# Patient Record
Sex: Male | Born: 1965
Health system: Southern US, Community
[De-identification: ages and names within clinical notes are randomized; demographics above are authoritative.]

## PROBLEM LIST (undated history)

## (undated) DIAGNOSIS — M109 Gout, unspecified: Secondary | ICD-10-CM

## (undated) DIAGNOSIS — I447 Left bundle-branch block, unspecified: Secondary | ICD-10-CM

## (undated) DIAGNOSIS — I509 Heart failure, unspecified: Secondary | ICD-10-CM

## (undated) DIAGNOSIS — I493 Ventricular premature depolarization: Secondary | ICD-10-CM

## (undated) DIAGNOSIS — I10 Essential (primary) hypertension: Secondary | ICD-10-CM

## (undated) DIAGNOSIS — I428 Other cardiomyopathies: Secondary | ICD-10-CM

## (undated) DIAGNOSIS — E119 Type 2 diabetes mellitus without complications: Secondary | ICD-10-CM

## (undated) HISTORY — DX: Other cardiomyopathies: I42.8

## (undated) HISTORY — PX: HIP SURGERY: SHX245

## (undated) HISTORY — DX: Type 2 diabetes mellitus without complications: E11.9

## (undated) HISTORY — DX: Essential (primary) hypertension: I10

## (undated) HISTORY — DX: Heart failure, unspecified: I50.9

## (undated) HISTORY — DX: Ventricular premature depolarization: I49.3

## (undated) HISTORY — PX: WRIST SURGERY: SHX841

## (undated) HISTORY — PX: CHOLECYSTECTOMY: SHX55

## (undated) HISTORY — DX: Gout, unspecified: M10.9

---

## 2005-09-24 HISTORY — PX: FRACTURE SURGERY: SHX138

## 2008-09-24 HISTORY — PX: WRIST SURGERY: SHX841

## 2009-09-24 HISTORY — PX: APPENDECTOMY: SHX54

## 2011-07-26 DEATH — deceased

## 2013-12-29 ENCOUNTER — Other Ambulatory Visit: Payer: Self-pay | Admitting: Sports Medicine

## 2013-12-29 ENCOUNTER — Encounter: Payer: Self-pay | Admitting: Sports Medicine

## 2013-12-29 ENCOUNTER — Ambulatory Visit (INDEPENDENT_AMBULATORY_CARE_PROVIDER_SITE_OTHER): Payer: 59 | Admitting: Sports Medicine

## 2013-12-29 VITALS — BP 169/99 | HR 69 | Ht 70.0 in | Wt 313.0 lb

## 2013-12-29 DIAGNOSIS — E038 Other specified hypothyroidism: Secondary | ICD-10-CM

## 2013-12-29 DIAGNOSIS — E039 Hypothyroidism, unspecified: Secondary | ICD-10-CM

## 2013-12-29 DIAGNOSIS — I5022 Chronic systolic (congestive) heart failure: Secondary | ICD-10-CM

## 2013-12-29 DIAGNOSIS — I509 Heart failure, unspecified: Secondary | ICD-10-CM

## 2013-12-29 DIAGNOSIS — I10 Essential (primary) hypertension: Secondary | ICD-10-CM

## 2013-12-29 DIAGNOSIS — M109 Gout, unspecified: Secondary | ICD-10-CM | POA: Insufficient documentation

## 2013-12-29 DIAGNOSIS — E1129 Type 2 diabetes mellitus with other diabetic kidney complication: Secondary | ICD-10-CM | POA: Insufficient documentation

## 2013-12-29 DIAGNOSIS — Z0000 Encounter for general adult medical examination without abnormal findings: Secondary | ICD-10-CM | POA: Insufficient documentation

## 2013-12-29 DIAGNOSIS — Z299 Encounter for prophylactic measures, unspecified: Secondary | ICD-10-CM

## 2013-12-29 DIAGNOSIS — E119 Type 2 diabetes mellitus without complications: Secondary | ICD-10-CM

## 2013-12-29 LAB — CBC
HCT: 39.4 % (ref 39.0–52.0)
Hemoglobin: 13.8 g/dL (ref 13.0–17.0)
MCH: 30.7 pg (ref 26.0–34.0)
MCHC: 35 g/dL (ref 30.0–36.0)
MCV: 87.6 fL (ref 78.0–100.0)
Platelets: 174 10*3/uL (ref 150–400)
RBC: 4.5 MIL/uL (ref 4.22–5.81)
RDW: 14.6 % (ref 11.5–15.5)
WBC: 7.1 10*3/uL (ref 4.0–10.5)

## 2013-12-29 MED ORDER — DAPAGLIFLOZIN PROPANEDIOL 10 MG PO TABS: 10.0000 mg | ORAL_TABLET | Freq: Every day | ORAL | Status: DC

## 2013-12-29 MED ORDER — MELOXICAM 15 MG PO TABS: ORAL_TABLET | ORAL | Status: DC

## 2013-12-29 NOTE — Assessment & Plan Note (Addendum)
Seems to be having mild low grade pain allover. Adding meloxicam, we will likely also add allopurinol if uric acid levels are elevated.  Uric acid levels are 10.7, adding allopurinol.

## 2013-12-29 NOTE — Assessment & Plan Note (Signed)
Blood pressure is elevated, we will recheck this at his followup visit. I would likely add Aldactone.

## 2013-12-29 NOTE — Assessment & Plan Note (Signed)
We can discuss this further at future visits.

## 2013-12-29 NOTE — Assessment & Plan Note (Signed)
Unsure of his control. Switching from glipizide to [place]

## 2013-12-29 NOTE — Progress Notes (Signed)
  Subjective:    CC: Establish care.   HPI:  Hypertension: Took his medicines today, it is a little bit elevated, no headache, chest pain, visual changes.  CHF: Systolic, per patient last ejection fraction was 50%, his cardiologist retired. He is currently stable on Coreg, losartan/hydrochlorothiazide. No paroxysmal nocturnal dyspnea or orthopnea. He does have some early fatigability.  Gout: Has low-grade pain in all joints, who gets flares in his ankles. Used to be on allopurinol but since stopped.  Diabetes mellitus type 2: Has been moderately well controlled on sulfonylureas, he has not tried any of the newer diabetic agents.  Past medical history, Surgical history, Family history not pertinant except as noted below, Social history, Allergies, and medications have been entered into the medical record, reviewed, and no changes needed.   Review of Systems: No headache, visual changes, nausea, vomiting, diarrhea, constipation, dizziness, abdominal pain, skin rash, fevers, chills, night sweats, swollen lymph nodes, weight loss, chest pain, body aches, joint swelling, muscle aches, shortness of breath, mood changes, visual or auditory hallucinations.  Objective:    General: Well Developed, well nourished, and in no acute distress.  Neuro: Alert and oriented x3, extra-ocular muscles intact, sensation grossly intact.  HEENT: Normocephalic, atraumatic, pupils equal round reactive to light, neck supple, no masses, no lymphadenopathy, thyroid nonpalpable.  Skin: Warm and dry, no rashes noted.  Cardiac: Regular rate and rhythm, no rubs or gallops, 1/6 systolic ejection murmur.  Respiratory: Clear to auscultation bilaterally. Not using accessory muscles, speaking in full sentences.  Abdominal: Soft, nontender, nondistended, positive bowel sounds, no masses, no organomegaly.  Musculoskeletal: Shoulder, elbow, wrist, hip, knee, ankle stable, and with full range of motion.  Impression and  Recommendations:    The patient was counselled, risk factors were discussed, anticipatory guidance given.

## 2013-12-29 NOTE — Assessment & Plan Note (Signed)
Seems to be under relatively good control and a good medication regimen. Treatment will center around risk factor modification. Referral downstairs to cardiology, I am going to order an echocardiogram and some routine blood work.

## 2013-12-30 DIAGNOSIS — E038 Other specified hypothyroidism: Secondary | ICD-10-CM | POA: Insufficient documentation

## 2013-12-30 DIAGNOSIS — E039 Hypothyroidism, unspecified: Secondary | ICD-10-CM | POA: Insufficient documentation

## 2013-12-30 LAB — HEMOGLOBIN A1C
Hgb A1c MFr Bld: 5.9 % — ABNORMAL HIGH (ref <5.7)
Mean Plasma Glucose: 123 mg/dL — ABNORMAL HIGH (ref <117)

## 2013-12-30 LAB — COMPREHENSIVE METABOLIC PANEL
ALT: 35 U/L (ref 0–53)
AST: 32 U/L (ref 0–37)
Albumin: 4.2 g/dL (ref 3.5–5.2)
Alkaline Phosphatase: 52 U/L (ref 39–117)
BUN: 17 mg/dL (ref 6–23)
CO2: 29 mEq/L (ref 19–32)
Calcium: 9.6 mg/dL (ref 8.4–10.5)
Chloride: 100 mEq/L (ref 96–112)
Creat: 1.11 mg/dL (ref 0.50–1.35)
Glucose, Bld: 174 mg/dL — ABNORMAL HIGH (ref 70–99)
Potassium: 4.6 mEq/L (ref 3.5–5.3)
Sodium: 140 mEq/L (ref 135–145)
Total Bilirubin: 0.9 mg/dL (ref 0.2–1.2)
Total Protein: 7 g/dL (ref 6.0–8.3)

## 2013-12-30 LAB — TSH: TSH: 5.053 u[IU]/mL — ABNORMAL HIGH (ref 0.350–4.500)

## 2013-12-30 LAB — LIPID PANEL
Cholesterol: 153 mg/dL (ref 0–200)
HDL: 48 mg/dL (ref >39)
LDL Cholesterol: 75 mg/dL (ref 0–99)
Total CHOL/HDL Ratio: 3.2 Ratio
Triglycerides: 152 mg/dL — ABNORMAL HIGH (ref <150)
VLDL: 30 mg/dL (ref 0–40)

## 2013-12-30 LAB — URIC ACID: Uric Acid, Serum: 10.7 mg/dL — ABNORMAL HIGH (ref 4.0–7.8)

## 2013-12-30 LAB — T3, FREE: T3, Free: 2.7 pg/mL (ref 2.3–4.2)

## 2013-12-30 LAB — T4, FREE: Free T4: 0.93 ng/dL (ref 0.80–1.80)

## 2013-12-30 MED ORDER — ALLOPURINOL 300 MG PO TABS: 300.0000 mg | ORAL_TABLET | Freq: Two times a day (BID) | ORAL | Status: DC

## 2013-12-30 NOTE — Assessment & Plan Note (Signed)
TSH is elevated. Adding T3 and T4 levels.

## 2013-12-30 NOTE — Addendum Note (Signed)
Addended by: Monica Becton on: 12/30/2013 08:40 AM   Modules accepted: Orders

## 2013-12-31 ENCOUNTER — Telehealth: Payer: Self-pay | Admitting: *Deleted

## 2013-12-31 DIAGNOSIS — E038 Other specified hypothyroidism: Secondary | ICD-10-CM

## 2013-12-31 DIAGNOSIS — E039 Hypothyroidism, unspecified: Secondary | ICD-10-CM

## 2013-12-31 MED ORDER — LEVOTHYROXINE SODIUM 50 MCG PO TABS: 50.0000 ug | ORAL_TABLET | Freq: Every day | ORAL | Status: DC

## 2013-12-31 NOTE — Addendum Note (Signed)
Addended by: Monica Becton on: 12/31/2013 01:59 PM   Modules accepted: Orders

## 2013-12-31 NOTE — Telephone Encounter (Signed)
Pt left vm stating that he is ok with you sending in a medication for his thyroid.

## 2013-12-31 NOTE — Assessment & Plan Note (Signed)
Normal T3 and T4, elevated TSH. We are going to treat this as untreated subclinical hypothyroidism does have an increased risk of cardiovascular all cause. Starting levothyroxine at 50 mcg.

## 2013-12-31 NOTE — Telephone Encounter (Signed)
Medication sent in. 

## 2014-01-01 NOTE — Telephone Encounter (Signed)
LMOM notifying pt of rx. 

## 2014-01-04 ENCOUNTER — Other Ambulatory Visit: Payer: Self-pay

## 2014-01-04 DIAGNOSIS — M109 Gout, unspecified: Secondary | ICD-10-CM

## 2014-01-04 DIAGNOSIS — E039 Hypothyroidism, unspecified: Secondary | ICD-10-CM

## 2014-01-04 DIAGNOSIS — E038 Other specified hypothyroidism: Secondary | ICD-10-CM

## 2014-01-04 MED ORDER — ALLOPURINOL 300 MG PO TABS: 300.0000 mg | ORAL_TABLET | Freq: Two times a day (BID) | ORAL | Status: DC

## 2014-01-04 MED ORDER — LEVOTHYROXINE SODIUM 50 MCG PO TABS: 50.0000 ug | ORAL_TABLET | Freq: Every day | ORAL | Status: DC

## 2014-01-04 MED ORDER — MELOXICAM 15 MG PO TABS: ORAL_TABLET | ORAL | Status: DC

## 2014-01-04 NOTE — Telephone Encounter (Signed)
Patient called wanted his medication Allopurinol, Meloxicam, and Levothyroxine sent  to [facility]. I called [facility] and cancelled the Rx that was sent in. Reino Lybbert,CMA

## 2014-01-08 ENCOUNTER — Ambulatory Visit (INDEPENDENT_AMBULATORY_CARE_PROVIDER_SITE_OTHER): Payer: 59 | Admitting: Family Medicine

## 2014-01-08 ENCOUNTER — Encounter: Payer: Self-pay | Admitting: Family Medicine

## 2014-01-08 VITALS — BP 154/87 | HR 68 | Wt 315.0 lb

## 2014-01-08 DIAGNOSIS — M109 Gout, unspecified: Secondary | ICD-10-CM

## 2014-01-08 MED ORDER — COLCHICINE 0.6 MG PO TABS: 0.6000 mg | ORAL_TABLET | Freq: Every day | ORAL | Status: DC

## 2014-01-08 NOTE — Progress Notes (Signed)
CC: Andre [name]. is a 48 y.o. male is here for rt hand swollen   Subjective: HPI:  Right hand pain localized in the fifth metacarpal phalangeal joint it is nonradiating mild in severity with mild swelling. Symptoms have been fluctuating on a daily basis since Saturday. Symptoms were resolving without intervention he then started allopurinol to stay and symptoms have been more persistent. Symptoms are worse when using his hand in the shop nothing particularly makes it better. He describes it as pain that he has had with gout however no superficial pain that typically accompanies gout.  Denies fevers, chills, nor overlying skin changes other than swelling.  Review Of Systems Outlined In HPI  Past Medical History  Diagnosis Date  . Hypertension   . Diabetes mellitus without complication   . Gout   . CHF (congestive heart failure)     Past Surgical History  Procedure Laterality Date  . Hip surgery    . Wrist surgery     Family History  Problem Relation Age of Onset  . Heart attack Father   . Hypertension Father   . Depression Sister   . Alcohol abuse Maternal Grandmother   . Alcohol abuse Maternal Grandfather     History   Social History  . Marital Status: Married    Spouse Name: N/A    Number of Children: N/A  . Years of Education: N/A   Occupational History  . Not on file.   Social History Main Topics  . Smoking status: Never Smoker   . Smokeless tobacco: Not on file  . Alcohol Use: Yes  . Drug Use: Not on file  . Sexual Activity: Not on file   Other Topics Concern  . Not on file   Social History Narrative  . No narrative on file     Objective: BP 154/87  Pulse 68  Wt 315 lb (142.883 kg)  General: Alert and Oriented, No Acute Distress HEENT: Pupils equal, round, reactive to light. Conjunctivae clear.  Moist mucous membranes Cardiac: Regular rate and rhythm. Normal S1/S2.  No murmurs, rubs, nor gallops.   Extremities: Mild swelling over the fifth  metacarpal phalangeal joint in the right hand dorsal and volar aspect. At the site there is no redness nor warmth nor pain to light touch. He has full range of motion strength throughout the fingers in the right hand neurovascularly intact throughout the right hand  Strong peripheral pulses.  Mental Status: No depression, anxiety, nor agitation. Skin: Warm and dry.  Assessment & Plan: [name] was seen today for rt hand swollen.  Diagnoses and associated orders for this visit:  Gout attack - colchicine 0.6 MG tablet; Take 1 tablet (0.6 mg total) by mouth daily. Only for one month.    It appears that he's probably having gout symptoms due to starting allopurinol without being on colchicine therefore heading daily colchicine for the next month. If not improving by Monday call for prednisone taper.   Return if symptoms worsen or fail to improve.

## 2014-01-15 ENCOUNTER — Encounter: Payer: Self-pay | Admitting: Sports Medicine

## 2014-01-18 MED ORDER — CARVEDILOL 25 MG PO TABS: 25.0000 mg | ORAL_TABLET | Freq: Two times a day (BID) | ORAL | Status: DC

## 2014-01-26 ENCOUNTER — Encounter: Payer: Self-pay | Admitting: Sports Medicine

## 2014-01-26 ENCOUNTER — Ambulatory Visit (INDEPENDENT_AMBULATORY_CARE_PROVIDER_SITE_OTHER): Payer: 59 | Admitting: Sports Medicine

## 2014-01-26 VITALS — BP 168/92 | HR 72 | Ht 70.0 in | Wt 312.0 lb

## 2014-01-26 DIAGNOSIS — E119 Type 2 diabetes mellitus without complications: Secondary | ICD-10-CM

## 2014-01-26 DIAGNOSIS — M109 Gout, unspecified: Secondary | ICD-10-CM

## 2014-01-26 DIAGNOSIS — I10 Essential (primary) hypertension: Secondary | ICD-10-CM

## 2014-01-26 DIAGNOSIS — I5022 Chronic systolic (congestive) heart failure: Secondary | ICD-10-CM

## 2014-01-26 MED ORDER — AMLODIPINE BESYLATE 10 MG PO TABS: 10.0000 mg | ORAL_TABLET | Freq: Every day | ORAL | Status: DC

## 2014-01-26 MED ORDER — SPIRONOLACTONE 25 MG PO TABS: 25.0000 mg | ORAL_TABLET | Freq: Two times a day (BID) | ORAL | Status: DC

## 2014-01-26 NOTE — Assessment & Plan Note (Signed)
Continue Farxiga, voiding much more.

## 2014-01-26 NOTE — Progress Notes (Signed)
  Subjective:    CC: Followup  HPI: CHF: Slight orthopnea, otherwise asymptomatic.  Hypertension: On carvedilol and valsartan/hydrochlorothiazide at maximal doses, blood pressure continues to be elevated.  Diabetes mellitus type 2: Stable on Farxiga, he is getting increasing urination.  Gout: Did have a flare recently and is currently still on colchicine, improved significantly, uric acid levels were over 10, currently on allopurinol as well.  Past medical history, Surgical history, Family history not pertinant except as noted below, Social history, Allergies, and medications have been entered into the medical record, reviewed, and no changes needed.   Review of Systems: No fevers, chills, night sweats, weight loss, chest pain, or shortness of breath.   Objective:    General: Well Developed, well nourished, and in no acute distress.  Neuro: Alert and oriented x3, extra-ocular muscles intact, sensation grossly intact.  HEENT: Normocephalic, atraumatic, pupils equal round reactive to light, neck supple, no masses, no lymphadenopathy, thyroid nonpalpable.  Skin: Warm and dry, no rashes. Cardiac: Regular rate and rhythm, no murmurs rubs or gallops, no lower extremity edema.  Respiratory: Clear to auscultation bilaterally. Not using accessory muscles, speaking in full sentences.  Impression and Recommendations:

## 2014-01-26 NOTE — Assessment & Plan Note (Signed)
Resolved with current medication, will be finishing up colchicine fairly soon.

## 2014-01-26 NOTE — Assessment & Plan Note (Signed)
He is having some orthopnea. Voiding more with farxiga but weight has stayed essentially the same, this is slightly concerning. Starting Aldactone.

## 2014-01-26 NOTE — Assessment & Plan Note (Signed)
Persistently elevated despite starting [place] and current medications. He is voiding significantly more. I am going to add amlodipine and aldactone. Return in 2 weeks.

## 2014-01-28 ENCOUNTER — Encounter: Payer: Self-pay | Admitting: Internal Medicine

## 2014-01-28 ENCOUNTER — Ambulatory Visit (INDEPENDENT_AMBULATORY_CARE_PROVIDER_SITE_OTHER): Payer: 59 | Admitting: Internal Medicine

## 2014-01-28 VITALS — BP 160/108 | HR 72 | Ht 70.0 in | Wt 311.0 lb

## 2014-01-28 DIAGNOSIS — I10 Essential (primary) hypertension: Secondary | ICD-10-CM

## 2014-01-28 DIAGNOSIS — I5022 Chronic systolic (congestive) heart failure: Secondary | ICD-10-CM

## 2014-01-28 LAB — BASIC METABOLIC PANEL
BUN: 13 mg/dL (ref 6–23)
CO2: 28 mEq/L (ref 19–32)
Calcium: 9 mg/dL (ref 8.4–10.5)
Chloride: 104 mEq/L (ref 96–112)
Creatinine, Ser: 1.4 mg/dL (ref 0.4–1.5)
GFR: 59.6 mL/min — ABNORMAL LOW (ref >60.00)
Glucose, Bld: 136 mg/dL — ABNORMAL HIGH (ref 70–99)
Potassium: 4 mEq/L (ref 3.5–5.1)
Sodium: 139 mEq/L (ref 135–145)

## 2014-01-28 LAB — BRAIN NATRIURETIC PEPTIDE: Pro B Natriuretic peptide (BNP): 323 pg/mL — ABNORMAL HIGH (ref 0.0–100.0)

## 2014-01-28 NOTE — Patient Instructions (Addendum)
Your physician recommends that you continue on your current medications as directed. Please refer to the Current Medication list given to you today.  Your physician has requested that you have an echocardiogram. Echocardiography is a painless test that uses sound waves to create images of your heart. It provides your doctor with information about the size and shape of your heart and how well your heart's chambers and valves are working. This procedure takes approximately one hour. There are no restrictions for this procedure.  Your physician recommends that you return for lab work TODAY (BMET, BNP)  Your physician recommends that you schedule a follow-up appointment in: 8 WEEKS WITH DR. Tenny Craw.

## 2014-01-28 NOTE — Progress Notes (Signed)
HPI  Andre [name] is a 48 yo who was referred for continued cardiac care He has a history lf HTN, gout, mild LV dysfunction on eco. He was seen by Dr Yetta BarreJones yesterday  AMlodipine and aldactone were added to his regimen.   Followed by Dr Ardyth HarpsKrull in [place]  Did minimum due to lack of insurance. Patient was admitted in 2004 with CHF  Diuresed  Had cath later ([facility]) Reported normal Last echo was several years aog  DOne infreq due to lack of insurance.   No CP  Recently orthopnea, wheezing,  SOB  Recently.    NO dizzess.    Tested for sleep apnea in 2006  Reported negative.    No Known Allergies  Current Outpatient Prescriptions  Medication Sig Dispense Refill  . allopurinol (ZYLOPRIM) 300 MG tablet Take 1 tablet (300 mg total) by mouth 2 (two) times daily.  60 tablet  3  . amLODipine (NORVASC) 10 MG tablet Take 1 tablet (10 mg total) by mouth daily.  30 tablet  11  . aspirin 81 MG tablet Take 81 mg by mouth daily.      . carvedilol (COREG) 25 MG tablet Take 1 tablet (25 mg total) by mouth 2 (two) times daily with a meal.  180 tablet  3  . colchicine 0.6 MG tablet Take 1 tablet (0.6 mg total) by mouth daily. Only for one month.  30 tablet  0  . Dapagliflozin Propanediol (FARXIGA) 10 MG TABS Take 10 mg by mouth daily.  30 tablet  3  . levothyroxine (SYNTHROID, LEVOTHROID) 50 MCG tablet Take 1 tablet (50 mcg total) by mouth daily.  90 tablet  3  . losartan-hydrochlorothiazide (HYZAAR) 100-25 MG per tablet Take 1 tablet by mouth daily.      . meloxicam (MOBIC) 15 MG tablet One tab PO qAM with breakfast for 2 weeks, then daily prn pain.  30 tablet  3  . Multiple Vitamins-Minerals (EQ COMPLETE MULTIVITAMIN-ADULT PO) Take by mouth.      . spironolactone (ALDACTONE) 25 MG tablet Take 1 tablet (25 mg total) by mouth 2 (two) times daily.  180 tablet  3   No current facility-administered medications for this visit.    Past Medical History  Diagnosis Date  . Hypertension   . Diabetes mellitus without  complication   . Gout   . CHF (congestive heart failure)     Past Surgical History  Procedure Laterality Date  . Hip surgery    . Wrist surgery      Family History  Problem Relation Age of Onset  . Heart attack Father   . Hypertension Father   . Depression Sister   . Alcohol abuse Maternal Grandmother   . Alcohol abuse Maternal Grandfather     History   Social History  . Marital Status: Married    Spouse Name: N/A    Number of Children: N/A  . Years of Education: N/A   Occupational History  . Not on file.   Social History Main Topics  . Smoking status: Never Smoker   . Smokeless tobacco: Not on file  . Alcohol Use: Yes  . Drug Use: Not on file  . Sexual Activity: Not on file   Other Topics Concern  . Not on file   Social History Narrative  . No narrative on file    Review of Systems:  All systems reviewed.  They are negative to the above problem except as previously stated.  Vital Signs: BP 160/108  Pulse 72  Ht 5\' 10"  (1.778 m)  Wt 311 lb (141.069 kg)  BMI 44.62 kg/m2  Physical Exam Patient is in NAD HEENT:  Normocephalic, atraumatic. EOMI, PERRLA.  Neck: JVP is normal.  No bruits.  Lungs: clear to auscultation. No rales no wheezes.  Heart: Regular rate and rhythm. Normal S1, S2. No S3.   No significant murmurs. PMI not displaced.  Abdomen:  Supple, nontender. Normal bowel sounds. No masses. No hepatomegaly.  Extremities:   Good distal pulses throughout. No lower extremity edema.  Musculoskeletal :moving all extremities.  Neuro:   alert and oriented x3.  CN II-XII grossly intact.  EK  SR 72  LVH with strain pattern   Assessment and Plan:  1.  CHF  No records  Sounds nonischemic  Will get records from [place] [facility]   Recent wheezing, orthopnea  Volume looks ok on exam WIll get BMET and BNP  Set up for echo  Tentative set up in 2 mons  2.  HTN  HIgh  He says it has always been high but this is higher  Just took meds (new) today Will need to have K  followed  Has appt with Dr T in 2 wks  3.  HL  Good control    4.  Morbid obesity  Counselled on wt loss

## 2014-02-01 ENCOUNTER — Telehealth: Payer: Self-pay | Admitting: Internal Medicine

## 2014-02-01 MED ORDER — FUROSEMIDE 40 MG PO TABS: ORAL_TABLET | ORAL | Status: DC

## 2014-02-01 NOTE — Telephone Encounter (Signed)
Informed patient of lab results and dr Tenny Craw recommendation to begin lasix 40 mg twice a week and recheck labs in 10 days. Appointment made for 5/21 (coming that day for echo as well). Patient verbalizes understanding and agreement.

## 2014-02-01 NOTE — Telephone Encounter (Signed)
ROI faxed to [facility] [phone] °

## 2014-02-01 NOTE — Telephone Encounter (Signed)
New message  ° ° °Patient calling for test results.   °

## 2014-02-09 ENCOUNTER — Encounter: Payer: Self-pay | Admitting: Sports Medicine

## 2014-02-09 ENCOUNTER — Ambulatory Visit (INDEPENDENT_AMBULATORY_CARE_PROVIDER_SITE_OTHER): Payer: 59 | Admitting: Sports Medicine

## 2014-02-09 VITALS — BP 117/75 | HR 78 | Ht 70.0 in | Wt 304.0 lb

## 2014-02-09 DIAGNOSIS — E038 Other specified hypothyroidism: Secondary | ICD-10-CM

## 2014-02-09 DIAGNOSIS — E119 Type 2 diabetes mellitus without complications: Secondary | ICD-10-CM

## 2014-02-09 DIAGNOSIS — I10 Essential (primary) hypertension: Secondary | ICD-10-CM

## 2014-02-09 DIAGNOSIS — E039 Hypothyroidism, unspecified: Secondary | ICD-10-CM

## 2014-02-09 DIAGNOSIS — I5022 Chronic systolic (congestive) heart failure: Secondary | ICD-10-CM

## 2014-02-09 LAB — TSH: TSH: 3.45 u[IU]/mL (ref 0.350–4.500)

## 2014-02-09 LAB — BASIC METABOLIC PANEL
BUN: 23 mg/dL (ref 6–23)
CO2: 28 mEq/L (ref 19–32)
Calcium: 10 mg/dL (ref 8.4–10.5)
Chloride: 96 mEq/L (ref 96–112)
Creat: 1.4 mg/dL — ABNORMAL HIGH (ref 0.50–1.35)
Glucose, Bld: 182 mg/dL — ABNORMAL HIGH (ref 70–99)
Potassium: 5.2 mEq/L (ref 3.5–5.3)
Sodium: 134 mEq/L — ABNORMAL LOW (ref 135–145)

## 2014-02-09 MED ORDER — CANAGLIFLOZIN 100 MG PO TABS: 1.0000 | ORAL_TABLET | Freq: Every day | ORAL | Status: DC

## 2014-02-09 NOTE — Assessment & Plan Note (Signed)
Rechecking TSH 

## 2014-02-09 NOTE — Assessment & Plan Note (Signed)
Euvolemic today. Blood pressure has stabilized, this is likely the Lasix effect. He does have followup with cardiology. He is currently symptomatically for resolution of orthopnea.

## 2014-02-09 NOTE — Progress Notes (Signed)
  Subjective:    CC: Followup  HPI: Systolic heart failure: Currently seeing cardiology, blood pressures have been elevated in the past despite max doses of thiazide/ACE, carvedilol, and Aldactone. Recently he was started on Lasix 40 mg twice a week, all orthopnea has resolved, blood pressure has improved.  Hypertension: Stabilized on additional dose of Lasix.  Diabetes mellitus type 2: Unable to get [place]. It sounds as though Invokana, is going to be the preferred agent.  Hyperlipidemia: Stable  Hypothyroidism, subclinical: Due for TSH recheck.  Past medical history, Surgical history, Family history not pertinant except as noted below, Social history, Allergies, and medications have been entered into the medical record, reviewed, and no changes needed.   Review of Systems: No fevers, chills, night sweats, weight loss, chest pain, or shortness of breath.   Objective:    General: Well Developed, well nourished, and in no acute distress.  Neuro: Alert and oriented x3, extra-ocular muscles intact, sensation grossly intact.  HEENT: Normocephalic, atraumatic, pupils equal round reactive to light, neck supple, no masses, no lymphadenopathy, thyroid nonpalpable.  Skin: Warm and dry, no rashes. Cardiac: Regular rate and rhythm, no murmurs rubs or gallops, no lower extremity edema.  Respiratory: Clear to auscultation bilaterally. Not using accessory muscles, speaking in full sentences.  Impression and Recommendations:

## 2014-02-09 NOTE — Assessment & Plan Note (Signed)
Marcelline Deist not covered, switching to invokana.

## 2014-02-09 NOTE — Assessment & Plan Note (Addendum)
Well controlled now, no changes. Rechecking BMET now that he is on Lasix and spironolactone.

## 2014-02-11 ENCOUNTER — Ambulatory Visit (HOSPITAL_COMMUNITY): Payer: 59 | Attending: Cardiovascular Disease | Admitting: Radiology

## 2014-02-11 ENCOUNTER — Ambulatory Visit (INDEPENDENT_AMBULATORY_CARE_PROVIDER_SITE_OTHER): Payer: 59 | Admitting: *Deleted

## 2014-02-11 DIAGNOSIS — I10 Essential (primary) hypertension: Secondary | ICD-10-CM

## 2014-02-11 DIAGNOSIS — I5022 Chronic systolic (congestive) heart failure: Secondary | ICD-10-CM

## 2014-02-11 DIAGNOSIS — I509 Heart failure, unspecified: Secondary | ICD-10-CM

## 2014-02-11 DIAGNOSIS — E119 Type 2 diabetes mellitus without complications: Secondary | ICD-10-CM

## 2014-02-11 LAB — BASIC METABOLIC PANEL
BUN: 24 mg/dL — ABNORMAL HIGH (ref 6–23)
CO2: 27 mEq/L (ref 19–32)
Calcium: 9.7 mg/dL (ref 8.4–10.5)
Chloride: 97 mEq/L (ref 96–112)
Creatinine, Ser: 1.4 mg/dL (ref 0.4–1.5)
GFR: 59.59 mL/min — ABNORMAL LOW (ref >60.00)
Glucose, Bld: 218 mg/dL — ABNORMAL HIGH (ref 70–99)
Potassium: 3.9 mEq/L (ref 3.5–5.1)
Sodium: 133 mEq/L — ABNORMAL LOW (ref 135–145)

## 2014-02-11 LAB — BRAIN NATRIURETIC PEPTIDE: Pro B Natriuretic peptide (BNP): 61 pg/mL (ref 0.0–100.0)

## 2014-02-11 NOTE — Progress Notes (Signed)
Echocardiogram performed.  

## 2014-02-17 ENCOUNTER — Encounter: Payer: Self-pay | Admitting: Internal Medicine

## 2014-02-17 NOTE — Telephone Encounter (Signed)
Left message for pt to call.

## 2014-02-17 NOTE — Telephone Encounter (Signed)
Follow up      Patient calling back stating that someone call him.

## 2014-02-18 NOTE — Telephone Encounter (Signed)
This encounter was created in error - please disregard.

## 2014-03-08 ENCOUNTER — Ambulatory Visit: Payer: 59 | Admitting: Sports Medicine

## 2014-04-05 ENCOUNTER — Ambulatory Visit (INDEPENDENT_AMBULATORY_CARE_PROVIDER_SITE_OTHER): Payer: Self-pay | Admitting: Internal Medicine

## 2014-04-05 ENCOUNTER — Encounter: Payer: Self-pay | Admitting: Internal Medicine

## 2014-04-05 VITALS — BP 125/71 | HR 75 | Ht 70.0 in | Wt 304.0 lb

## 2014-04-05 DIAGNOSIS — R0601 Orthopnea: Secondary | ICD-10-CM

## 2014-04-05 LAB — BRAIN NATRIURETIC PEPTIDE: Pro B Natriuretic peptide (BNP): 70 pg/mL (ref 0.0–100.0)

## 2014-04-05 LAB — BASIC METABOLIC PANEL
BUN: 21 mg/dL (ref 6–23)
CO2: 28 mEq/L (ref 19–32)
Calcium: 9.2 mg/dL (ref 8.4–10.5)
Chloride: 106 mEq/L (ref 96–112)
Creatinine, Ser: 1.2 mg/dL (ref 0.4–1.5)
GFR: 68.72 mL/min (ref >60.00)
Glucose, Bld: 202 mg/dL — ABNORMAL HIGH (ref 70–99)
Potassium: 4.3 mEq/L (ref 3.5–5.1)
Sodium: 138 mEq/L (ref 135–145)

## 2014-04-05 NOTE — Progress Notes (Signed)
HPI  Andre [name] is a 48 yo who was referred for continued cardiac care He has a history lf HTN, gout, mild LV dysfunction on echo. He has had his medcines for HTN adjusted this spring  Some of problems are becaue he lacks insurance. He was seen by Dr Yetta Barre yesterday  AMlodipine and aldactone were added to his regimen.   Cath in 2004 nromal   Followed by Dr Ardyth Harps in [facility]  I saw the patient in May  Recomm that he take lasix bid 1x per week   Echo showed severel LV dysfunction LVEF 20 to 25%.   Tested for sleep apnea in 2006  Reported negative.    No Known Allergies  Current Outpatient Prescriptions  Medication Sig Dispense Refill  . allopurinol (ZYLOPRIM) 300 MG tablet Take 1 tablet (300 mg total) by mouth 2 (two) times daily.  60 tablet  3  . amLODipine (NORVASC) 10 MG tablet Take 1 tablet (10 mg total) by mouth daily.  30 tablet  11  . aspirin 81 MG tablet Take 81 mg by mouth daily.      . Canagliflozin (INVOKANA) 100 MG TABS Take 1 tablet (100 mg total) by mouth daily.  30 tablet  3  . carvedilol (COREG) 25 MG tablet Take 1 tablet (25 mg total) by mouth 2 (two) times daily with a meal.  180 tablet  3  . colchicine 0.6 MG tablet Take 1 tablet (0.6 mg total) by mouth daily. Only for one month.  30 tablet  0  . furosemide (LASIX) 40 MG tablet Take 40 mg twice a week  30 tablet  6  . levothyroxine (SYNTHROID, LEVOTHROID) 50 MCG tablet Take 1 tablet (50 mcg total) by mouth daily.  90 tablet  3  . losartan-hydrochlorothiazide (HYZAAR) 100-25 MG per tablet Take 1 tablet by mouth daily.      . meloxicam (MOBIC) 15 MG tablet One tab PO qAM with breakfast for 2 weeks, then daily prn pain.  30 tablet  3  . Multiple Vitamins-Minerals (EQ COMPLETE MULTIVITAMIN-ADULT PO) Take by mouth.      . spironolactone (ALDACTONE) 25 MG tablet Take 1 tablet (25 mg total) by mouth 2 (two) times daily.  180 tablet  3   No current facility-administered medications for this visit.    Past Medical History  Diagnosis  Date  . Hypertension   . Diabetes mellitus without complication   . Gout   . CHF (congestive heart failure)     Past Surgical History  Procedure Laterality Date  . Hip surgery    . Wrist surgery      Family History  Problem Relation Age of Onset  . Heart attack Father   . Hypertension Father   . Depression Sister   . Alcohol abuse Maternal Grandmother   . Alcohol abuse Maternal Grandfather     History   Social History  . Marital Status: Married    Spouse Name: N/A    Number of Children: N/A  . Years of Education: N/A   Occupational History  . Not on file.   Social History Main Topics  . Smoking status: Never Smoker   . Smokeless tobacco: Not on file  . Alcohol Use: Yes  . Drug Use: Not on file  . Sexual Activity: Not on file   Other Topics Concern  . Not on file   Social History Narrative  . No narrative on file    Review of Systems:  All systems reviewed.  They are negative to the above problem except as previously stated.  Vital Signs: BP 125/71  Pulse 75  Ht 5\' 10"  (1.778 m)  Wt 304 lb (137.893 kg)  BMI 43.62 kg/m2  Physical Exam Patient is in NAD HEENT:  Normocephalic, atraumatic. EOMI, PERRLA.  Neck: JVP is normal.  No bruits.  Lungs: clear to auscultation. No rales no wheezes.  Heart: Regular rate and rhythm. Normal S1, S2. No S3.   No significant murmurs. PMI not displaced.  Abdomen:  Supple, nontender. Normal bowel sounds. No masses. No hepatomegaly.  Extremities:   Good distal pulses throughout. No lower extremity edema.  Musculoskeletal :moving all extremities.  Neuro:   alert and oriented x3.  CN II-XII grossly intact.  EK  SR 72  LVH with strain pattern   Assessment and Plan:  1.  CHF  Still no records from [place]  Will need to get. Patinet still with SOB  I would set up for lexiscna myoview to r/o ischemia.   2.  HTN  BP is better.   3.  HL  Good control    4.  Morbid obesity  Counselled on wt loss

## 2014-04-05 NOTE — Patient Instructions (Signed)
Will obtain labs today and call you with the results (BMET/BNP)  Your physician recommends that you continue on your current medications as directed. Please refer to the Current Medication list given to you today.  Your physician has requested that you have a lexiscan myoview. For further information please visit [email or web address] Please follow instruction sheet, as given.

## 2014-04-13 ENCOUNTER — Ambulatory Visit (HOSPITAL_COMMUNITY): Payer: Self-pay | Attending: Cardiovascular Disease | Admitting: Radiology

## 2014-04-13 VITALS — BP 147/105 | HR 80 | Ht 70.0 in | Wt 300.0 lb

## 2014-04-13 DIAGNOSIS — R0602 Shortness of breath: Secondary | ICD-10-CM | POA: Insufficient documentation

## 2014-04-13 DIAGNOSIS — I10 Essential (primary) hypertension: Secondary | ICD-10-CM | POA: Insufficient documentation

## 2014-04-13 DIAGNOSIS — R0601 Orthopnea: Secondary | ICD-10-CM

## 2014-04-13 DIAGNOSIS — Z8249 Family history of ischemic heart disease and other diseases of the circulatory system: Secondary | ICD-10-CM | POA: Insufficient documentation

## 2014-04-13 DIAGNOSIS — E119 Type 2 diabetes mellitus without complications: Secondary | ICD-10-CM | POA: Insufficient documentation

## 2014-04-13 MED ORDER — REGADENOSON 0.4 MG/5ML IV SOLN
0.4000 mg | Freq: Once | INTRAVENOUS | Status: AC
  Administered 2014-04-13: 0.4 mg via INTRAVENOUS

## 2014-04-13 MED ORDER — TECHNETIUM TC 99M SESTAMIBI GENERIC - CARDIOLITE
33.0000 | Freq: Once | INTRAVENOUS | Status: AC | PRN
  Administered 2014-04-13: 33 via INTRAVENOUS

## 2014-04-13 NOTE — Progress Notes (Signed)
[facility] SITE 3 NUCLEAR MED [address] [place], [place] [address] [phone]    Cardiology Nuclear Med Study  Andre [name]. is a 48 y.o. male     MRN : [id number]     DOB: [birth year]  Procedure Date: 04/13/2014  Nuclear Med Background Indication for Stress Test:  Evaluation for Ischemia  History:  Cath 2004 (normal), Echo 2015 EF 20-25% Cardiac Risk Factors: Family History - CAD, Hypertension and NIDDM  Symptoms:  SOB   Nuclear Pre-Procedure Caffeine/Decaff Intake:  None NPO After: 6:00am   Lungs:  clear O2 Sat: 96% on room air. IV 0.9% NS with Angio Cath:  22g  IV Site: R Hand  IV Started by:  Bonnita Levan, RN  Chest Size (in):  50+ Cup Size: n/a  Height: 5\' 10"  (1.778 m)  Weight:  300 lb (136.079 kg)  BMI:  Body mass index is 43.05 kg/(m^2). Tech Comments:  n/a    Nuclear Med Study 1 or 2 day study: 2 day  Stress Test Type:  Lexiscan  Reading MD: Armanda Magic, MD  Order Authorizing Provider:  Dietrich Pates, MD  Resting Radionuclide: Technetium 34m Sestamibi  Resting Radionuclide Dose: 33.0 mCi on 04/14/14   Stress Radionuclide:  Technetium 51m Sestamibi  Stress Radionuclide Dose: 33.0 mCi on 04/13/14           Stress Protocol Rest HR: 80 Stress HR: 95  Rest BP: 147/105 Stress BP: 108/84  Exercise Time (min): n/a METS: n/a   Predicted Max HR: 175 bpm % Max HR: 54.29 bpm Rate Pressure Product: 00174   Dose of Adenosine (mg):  n/a Dose of Lexiscan: 0.4 mg  Dose of Atropine (mg): n/a Dose of Dobutamine: n/a mcg/kg/min (at max HR)  Stress Test Technologist: Nelson Chimes, BS-ES  Nuclear Technologist:  Doyne Keel, CNMT     Rest Procedure:  Myocardial perfusion imaging was performed at rest 45 minutes following the intravenous administration of Technetium 19m Sestamibi. Rest ECG: NSR with anterior infarct and PVC's, LAFB with T wave inversions in I and aVL.  Stress Procedure:  The patient received IV Lexiscan 0.4 mg over 15-seconds.  Technetium  80m Sestamibi injected at 30-seconds.  Quantitative spect images were obtained after a 45 minute delay. During the infusion of Lexiscan the patient complained of SOB.  This resolved in recovery.  Stress ECG: No significant change from baseline ECG  QPS Raw Data Images:  Mild diaphragmatic attenuation.  Normal left ventricular size. Stress Images:  There is decreased uptake in the apex.  There is decreased uptake in the entire inferior wall. Rest Images:  There is decreased uptake in the apex.  There is decreased uptake in the entire inferior wall. Subtraction (SDS):  The apical defect appears fixed.  The inferior defect is partially fixed c/w infarct with very mild peri infarct ischemia. Transient Ischemic Dilatation (Normal <1.22):  1.03 Lung/Heart Ratio (Normal <0.45):  0.32  Quantitative Gated Spect Images QGS EDV:  n/a QGS ESV:  n/a  Impression Exercise Capacity:  Lexiscan with no exercise. BP Response:  Normal blood pressure response. Clinical Symptoms:  There is dyspnea. ECG Impression:  No significant ST segment change suggestive of ischemia. Comparison with Prior Nuclear Study: No images to compare  Overall Impression:  Intermediate risk stress nuclear study The apical defect appears fixed.  The inferior defect is partially fixed c/w infarct with very mild peri infarct ischemia.  This could also represents variations in diaphragmatic attenution since there is significant  overlying diaphragm noted on RAW images..  LV Ejection Fraction: Study not gated.  LV Wall Motion:  Study not gated.  2D echo showed severe LV dysfunction with EF 20-25% and global hypokinesis.  Signed: Armanda Magicraci Cheryal Salas, MD [facility]

## 2014-04-14 ENCOUNTER — Ambulatory Visit (HOSPITAL_COMMUNITY): Payer: Self-pay | Attending: Cardiovascular Disease

## 2014-04-14 DIAGNOSIS — R0989 Other specified symptoms and signs involving the circulatory and respiratory systems: Secondary | ICD-10-CM

## 2014-04-14 MED ORDER — TECHNETIUM TC 99M SESTAMIBI GENERIC - CARDIOLITE
33.0000 | Freq: Once | INTRAVENOUS | Status: AC | PRN
  Administered 2014-04-14: 33 via INTRAVENOUS

## 2014-05-12 ENCOUNTER — Ambulatory Visit: Payer: 59 | Admitting: Sports Medicine

## 2014-09-29 ENCOUNTER — Ambulatory Visit (INDEPENDENT_AMBULATORY_CARE_PROVIDER_SITE_OTHER): Payer: BLUE CROSS/BLUE SHIELD | Admitting: Sports Medicine

## 2014-09-29 ENCOUNTER — Encounter: Payer: Self-pay | Admitting: Sports Medicine

## 2014-09-29 VITALS — BP 125/83 | HR 93 | Ht 70.0 in | Wt 302.0 lb

## 2014-09-29 DIAGNOSIS — I5022 Chronic systolic (congestive) heart failure: Secondary | ICD-10-CM

## 2014-09-29 DIAGNOSIS — M1000 Idiopathic gout, unspecified site: Secondary | ICD-10-CM

## 2014-09-29 DIAGNOSIS — M109 Gout, unspecified: Secondary | ICD-10-CM

## 2014-09-29 DIAGNOSIS — Z23 Encounter for immunization: Secondary | ICD-10-CM

## 2014-09-29 DIAGNOSIS — I10 Essential (primary) hypertension: Secondary | ICD-10-CM

## 2014-09-29 DIAGNOSIS — E038 Other specified hypothyroidism: Secondary | ICD-10-CM

## 2014-09-29 DIAGNOSIS — E039 Hypothyroidism, unspecified: Secondary | ICD-10-CM

## 2014-09-29 DIAGNOSIS — E119 Type 2 diabetes mellitus without complications: Secondary | ICD-10-CM

## 2014-09-29 LAB — CBC
HCT: 43.3 % (ref 39.0–52.0)
Hemoglobin: 15.6 g/dL (ref 13.0–17.0)
MCH: 30.7 pg (ref 26.0–34.0)
MCHC: 36 g/dL (ref 30.0–36.0)
MCV: 85.2 fL (ref 78.0–100.0)
MPV: 11.1 fL (ref 8.6–12.4)
Platelets: 206 10*3/uL (ref 150–400)
RBC: 5.08 MIL/uL (ref 4.22–5.81)
RDW: 14.2 % (ref 11.5–15.5)
WBC: 8.3 10*3/uL (ref 4.0–10.5)

## 2014-09-29 LAB — COMPREHENSIVE METABOLIC PANEL
ALT: 45 U/L (ref 0–53)
AST: 32 U/L (ref 0–37)
Albumin: 4.2 g/dL (ref 3.5–5.2)
Alkaline Phosphatase: 56 U/L (ref 39–117)
BUN: 17 mg/dL (ref 6–23)
CO2: 28 mEq/L (ref 19–32)
Calcium: 9.5 mg/dL (ref 8.4–10.5)
Chloride: 99 mEq/L (ref 96–112)
Creat: 1.07 mg/dL (ref 0.50–1.35)
Glucose, Bld: 232 mg/dL — ABNORMAL HIGH (ref 70–99)
Potassium: 4.3 mEq/L (ref 3.5–5.3)
Sodium: 136 mEq/L (ref 135–145)
Total Bilirubin: 1.1 mg/dL (ref 0.2–1.2)
Total Protein: 7.3 g/dL (ref 6.0–8.3)

## 2014-09-29 LAB — LIPID PANEL
Cholesterol: 160 mg/dL (ref 0–200)
HDL: 42 mg/dL (ref >39)
LDL Cholesterol: 92 mg/dL (ref 0–99)
Total CHOL/HDL Ratio: 3.8 Ratio
Triglycerides: 129 mg/dL (ref <150)
VLDL: 26 mg/dL (ref 0–40)

## 2014-09-29 LAB — HEMOGLOBIN A1C
Hgb A1c MFr Bld: 7.1 % — ABNORMAL HIGH (ref <5.7)
Mean Plasma Glucose: 157 mg/dL — ABNORMAL HIGH (ref <117)

## 2014-09-29 LAB — TSH: TSH: 2.502 u[IU]/mL (ref 0.350–4.500)

## 2014-09-29 LAB — URIC ACID: Uric Acid, Serum: 7.6 mg/dL (ref 4.0–7.8)

## 2014-09-29 MED ORDER — ALLOPURINOL 300 MG PO TABS: 300.0000 mg | ORAL_TABLET | Freq: Two times a day (BID) | ORAL | Status: DC

## 2014-09-29 MED ORDER — LISINOPRIL 2.5 MG PO TABS: 2.5000 mg | ORAL_TABLET | Freq: Every day | ORAL | Status: DC

## 2014-09-29 MED ORDER — SPIRONOLACTONE 25 MG PO TABS: 25.0000 mg | ORAL_TABLET | Freq: Two times a day (BID) | ORAL | Status: DC

## 2014-09-29 MED ORDER — CANAGLIFLOZIN 100 MG PO TABS: 100.0000 mg | ORAL_TABLET | Freq: Every day | ORAL | Status: DC

## 2014-09-29 MED ORDER — GLIPIZIDE 10 MG PO TABS: 10.0000 mg | ORAL_TABLET | Freq: Every day | ORAL | Status: DC

## 2014-09-29 NOTE — Assessment & Plan Note (Signed)
Rechecking uric acid

## 2014-09-29 NOTE — Assessment & Plan Note (Signed)
Well controlled with most recent TSH. Rechecking.

## 2014-09-29 NOTE — Assessment & Plan Note (Signed)
Well controlled, no changes 

## 2014-09-29 NOTE — Assessment & Plan Note (Addendum)
Rechecking A1c, lipid panel. Most recent A1c was well controlled. Foot exam today. Pneumococcal vaccine given today. Referral to ophthalmology. He does have heart failure and is not on an ACE inhibitor, we are going to add low-dose lisinopril.  Hemoglobin A1c looks good, we are going to increase invokana, to 300 mg daily for better control.

## 2014-09-29 NOTE — Progress Notes (Signed)
  Subjective:    CC: Follow-up  HPI: Hypothyroidism: Well controlled now with low-dose levothyroxine.  Studies have shown improvement in mortality with treatment of subclinical hypothyroidism.  Congestive heart failure: Ejection fraction of 20-25%, has not yet discussed implantable cardioverter defibrillator placement, he is euvolemic, a symptomatically, he is not on an ACE inhibitor. He does follow-up with cardiology.  Diabetes mellitus type 2: Has been well-controlled in the past, due for some screening measures.   Gout: Well controlled.  Hypertension: Well controlled.  Past medical history, Surgical history, Family history not pertinant except as noted below, Social history, Allergies, and medications have been entered into the medical record, reviewed, and no changes needed.   Review of Systems: No fevers, chills, night sweats, weight loss, chest pain, or shortness of breath.   Objective:    General: Well Developed, well nourished, and in no acute distress.  Neuro: Alert and oriented x3, extra-ocular muscles intact, sensation grossly intact.  HEENT: Normocephalic, atraumatic, pupils equal round reactive to light, neck supple, no masses, no lymphadenopathy, thyroid nonpalpable.  Skin: Warm and dry, no rashes. Cardiac: Regular rate and rhythm, no murmurs rubs or gallops, no lower extremity edema.  Respiratory: Clear to auscultation bilaterally. Not using accessory muscles, speaking in full sentences.  Diabetic Foot Exam Both feet were examined, there are no signs of ulceration or abnormal callus. Nails are unremarkable. Dorsalis pedis and posterior tibial pulses are palpable. Sensation is intact to sharp and monofilament. Shoes are of appropriate fitment.  Impression and Recommendations:

## 2014-09-29 NOTE — Assessment & Plan Note (Signed)
Starting low-dose lisinopril mortality benefit. There is assistance in management with cardiology. He is euvolemic today. Asymptomatic. Ejection fraction is 20-25%, he would be a candidate for an implantable cardioverter defibrillator. We'll touch base with cardiology regarding this.

## 2014-09-30 MED ORDER — CANAGLIFLOZIN 300 MG PO TABS: 300.0000 mg | ORAL_TABLET | Freq: Every day | ORAL | Status: DC

## 2014-09-30 NOTE — Addendum Note (Signed)
Addended by: Monica Becton on: 09/30/2014 12:31 PM   Modules accepted: Orders

## 2014-10-28 NOTE — Progress Notes (Signed)
HPI Andre [name] is a 49 yo who has a history of chronic systolic CHF  Previously followed in [place]  Had cath in 2004 that was reproted normal.  I saw him in May  Sched myoview that showed no ischemia  Inferior defect consistent with scar LVEF down. By echo LVEF was 20 to 25%   He also has a history of HTN, gout.   I saw him back in May   Since I saw him he says he feels great, better than he has in a long time  No CP  No SOB  Active at work    No Known Allergies  Current Outpatient Prescriptions  Medication Sig Dispense Refill  . allopurinol (ZYLOPRIM) 300 MG tablet Take 1 tablet (300 mg total) by mouth 2 (two) times daily. 60 tablet 3  . amLODipine (NORVASC) 10 MG tablet Take 1 tablet (10 mg total) by mouth daily. 30 tablet 11  . aspirin 81 MG tablet Take 81 mg by mouth daily.    . canagliflozin (INVOKANA) 300 MG TABS tablet Take 300 mg by mouth daily before breakfast. 30 tablet 3  . carvedilol (COREG) 25 MG tablet Take 1 tablet (25 mg total) by mouth 2 (two) times daily with a meal. 180 tablet 3  . furosemide (LASIX) 40 MG tablet Take 40 mg by mouth 2 (two) times a week. Patient takes 40mg  by mouth on Monday and Thursday    . glipiZIDE (GLUCOTROL) 10 MG tablet Take 1 tablet (10 mg total) by mouth daily before breakfast. 90 tablet 3  . levothyroxine (SYNTHROID, LEVOTHROID) 50 MCG tablet Take 50 mcg by mouth daily before breakfast.    . lisinopril (ZESTRIL) 2.5 MG tablet Take 1 tablet (2.5 mg total) by mouth daily. 90 tablet 3  . meloxicam (MOBIC) 15 MG tablet One tab PO qAM with breakfast for 2 weeks, then daily prn pain. 30 tablet 3  . Multiple Vitamins-Minerals (MULTIVITAMIN WITH MINERALS) tablet Take 1 tablet by mouth daily.    Marland Kitchen spironolactone (ALDACTONE) 25 MG tablet Take 1 tablet (25 mg total) by mouth 2 (two) times daily. 180 tablet 3   No current facility-administered medications for this visit.    Past Medical History  Diagnosis Date  . Hypertension   . Diabetes mellitus without  complication   . Gout   . CHF (congestive heart failure)     Past Surgical History  Procedure Laterality Date  . Hip surgery    . Wrist surgery      Family History  Problem Relation Age of Onset  . Heart attack Father   . Hypertension Father   . Depression Sister   . Alcohol abuse Maternal Grandmother   . Alcohol abuse Maternal Grandfather     History   Social History  . Marital Status: Married    Spouse Name: N/A    Number of Children: N/A  . Years of Education: N/A   Occupational History  . Not on file.   Social History Main Topics  . Smoking status: Never Smoker   . Smokeless tobacco: Not on file  . Alcohol Use: Yes  . Drug Use: Not on file  . Sexual Activity: Not on file   Other Topics Concern  . Not on file   Social History Narrative    Review of Systems:  All systems reviewed.  They are negative to the above problem except as previously stated.  Vital Signs: BP 115/78 mmHg  Pulse 76  Ht 5\' 10"  (1.778 m)  Wt 301 lb 6.4 oz (136.714 kg)  BMI 43.25 kg/m2  SpO2 97%  Physical Exam Patient is morbidly obese 49 yo in NAD HEENT:  Normocephalic, atraumatic. EOMI, PERRLA.  Neck: JVP is normal.  No bruits.  Lungs: clear to auscultation. No rales no wheezes.  Heart: Regular rate and rhythm. Normal S1, S2. No S3.   No significant murmurs. PMI not displaced.  Abdomen:  Supple, nontender. Normal bowel sounds. No masses. No hepatomegaly.  Extremities:   Good distal pulses throughout. Tr lower extremity edema.  Musculoskeletal :moving all extremities.  Neuro:   alert and oriented x3.  CN II-XII grossly intact.  EKG  Not done   Assessment and Plan:  1.  CHF  Probable nonischemic cardiomyopathy though I never received records from [facility]  Cath in 2004 reported normal  Myoview scan with inferior scar (which may reflect soft tissue attenuation with size) Doing well  Active  No CP or SOb  I will refer to EP for consideration of ICD    2.  HTN  BP is good   3.  HL   LDL on recent check was 92    4.  Morbid obesity  Counselled on wt loss  He was up this summer and went down  Agreed with continued loss

## 2014-10-29 ENCOUNTER — Encounter: Payer: Self-pay | Admitting: Internal Medicine

## 2014-10-29 ENCOUNTER — Ambulatory Visit (INDEPENDENT_AMBULATORY_CARE_PROVIDER_SITE_OTHER): Payer: BLUE CROSS/BLUE SHIELD | Admitting: Internal Medicine

## 2014-10-29 VITALS — BP 115/78 | HR 76 | Ht 70.0 in | Wt 301.4 lb

## 2014-10-29 DIAGNOSIS — I10 Essential (primary) hypertension: Secondary | ICD-10-CM

## 2014-10-29 DIAGNOSIS — I5022 Chronic systolic (congestive) heart failure: Secondary | ICD-10-CM

## 2014-10-29 NOTE — Patient Instructions (Signed)
Your physician recommends that you continue on your current medications as directed. Please refer to the Current Medication list given to you today.  Referral to EP for non-ischemic cardiomyopathy and decrease ejection fraction.  Your physician wants you to follow-up in: October with Dr. Tenny Craw.  You will receive a reminder letter in the mail two months in advance. If you don't receive a letter, please call our office to schedule the follow-up appointment.

## 2014-11-02 ENCOUNTER — Encounter: Payer: BLUE CROSS/BLUE SHIELD | Admitting: Sports Medicine

## 2014-11-16 ENCOUNTER — Encounter: Payer: Self-pay | Admitting: *Deleted

## 2014-11-16 ENCOUNTER — Ambulatory Visit (INDEPENDENT_AMBULATORY_CARE_PROVIDER_SITE_OTHER): Payer: BLUE CROSS/BLUE SHIELD | Admitting: Internal Medicine

## 2014-11-16 ENCOUNTER — Encounter: Payer: Self-pay | Admitting: Internal Medicine

## 2014-11-16 VITALS — BP 118/80 | HR 67 | Ht 70.0 in | Wt 297.0 lb

## 2014-11-16 DIAGNOSIS — I5022 Chronic systolic (congestive) heart failure: Secondary | ICD-10-CM

## 2014-11-16 DIAGNOSIS — I10 Essential (primary) hypertension: Secondary | ICD-10-CM

## 2014-11-16 NOTE — Assessment & Plan Note (Signed)
His symptoms are class 2 and his QRS duration is 155 ms with LBBB and his EF is 20% by echo. He is on maximal medical therapy. I have discussed the risks/benefits/goals/expectations of BIV ICD implant and he wishes to proceed.

## 2014-11-16 NOTE — Patient Instructions (Addendum)
Your physician recommends that you schedule a follow-up appointment in: 7-10 days from 12/30/14 in device clinic for wound check  Your physician recommends that you return for lab work on 12/23/14 at 7:30  See instruction sheet for procedure

## 2014-11-16 NOTE — Assessment & Plan Note (Signed)
His blood pressure is well controlled. Will follow. He will continue his current meds. He may need to work on dietary indiscretion.

## 2014-11-16 NOTE — Progress Notes (Signed)
HPI Andre [name] is referred today by Dr. Tenny Craw to consider ICD implant. The patient is a 49 yo man with chronic systolic heart failure dating back to 2004. He has had progressive LV dysfunction and heart failure symptoms. He had a negative myoview recently. The patient has been on optimall therapy with an ACE inhibitor, beta blocker and aldactone. His EF is 20-25% and he has class 2 heart failure symptoms. Previously his QRS duration was 125 ms and now is 155 ms. He has never had syncope.  No Known Allergies   Current Outpatient Prescriptions  Medication Sig Dispense Refill  . allopurinol (ZYLOPRIM) 300 MG tablet Take 1 tablet (300 mg total) by mouth 2 (two) times daily. 60 tablet 3  . amLODipine (NORVASC) 10 MG tablet Take 1 tablet (10 mg total) by mouth daily. 30 tablet 11  . aspirin 81 MG tablet Take 81 mg by mouth daily.    . canagliflozin (INVOKANA) 300 MG TABS tablet Take 300 mg by mouth daily before breakfast. 30 tablet 3  . carvedilol (COREG) 25 MG tablet Take 1 tablet (25 mg total) by mouth 2 (two) times daily with a meal. 180 tablet 3  . furosemide (LASIX) 40 MG tablet Take 40 mg by mouth 2 (two) times a week. Patient takes 40mg  by mouth on Monday and Thursday    . glipiZIDE (GLUCOTROL) 10 MG tablet Take 1 tablet (10 mg total) by mouth daily before breakfast. 90 tablet 3  . levothyroxine (SYNTHROID, LEVOTHROID) 50 MCG tablet Take 50 mcg by mouth daily before breakfast.    . lisinopril (ZESTRIL) 2.5 MG tablet Take 1 tablet (2.5 mg total) by mouth daily. 90 tablet 3  . meloxicam (MOBIC) 15 MG tablet One tab PO qAM with breakfast for 2 weeks, then daily prn pain. 30 tablet 3  . Multiple Vitamins-Minerals (MULTIVITAMIN WITH MINERALS) tablet Take 1 tablet by mouth daily.    Marland Kitchen spironolactone (ALDACTONE) 25 MG tablet Take 1 tablet (25 mg total) by mouth 2 (two) times daily. 180 tablet 3   No current facility-administered medications for this visit.     Past Medical History    Diagnosis Date  . Hypertension   . Diabetes mellitus without complication   . Gout   . CHF (congestive heart failure)     ROS:   All systems reviewed and negative except as noted in the HPI.   Past Surgical History  Procedure Laterality Date  . Hip surgery    . Wrist surgery       Family History  Problem Relation Age of Onset  . Heart attack Father   . Hypertension Father   . Depression Sister   . Alcohol abuse Maternal Grandmother   . Alcohol abuse Maternal Grandfather      History   Social History  . Marital Status: Married    Spouse Name: N/A  . Number of Children: N/A  . Years of Education: N/A   Occupational History  . Not on file.   Social History Main Topics  . Smoking status: Never Smoker   . Smokeless tobacco: Not on file  . Alcohol Use: Yes  . Drug Use: Not on file  . Sexual Activity: Not on file   Other Topics Concern  . Not on file   Social History Narrative     BP 118/80 mmHg  Pulse 67  Ht 5\' 10"  (1.778 m)  Wt 297 lb (134.718 kg)  BMI 42.61 kg/m2  Physical Exam:  Well appearing 49 yo man, NAD HEENT: Unremarkable Neck:  No JVD, no thyromegally Back:  No CVA tenderness Lungs:  Clear with no wheezes HEART:  Regular rate rhythm, no murmurs, no rubs, no clicks Abd:  soft, positive bowel sounds, no organomegally, no rebound, no guarding Ext:  2 plus pulses, no edema, no cyanosis, no clubbing Skin:  No rashes no nodules Neuro:  CN II through XII intact, motor grossly intact  EKG - nsr with LBBB   Assess/Plan:

## 2014-12-23 ENCOUNTER — Other Ambulatory Visit (INDEPENDENT_AMBULATORY_CARE_PROVIDER_SITE_OTHER): Payer: BLUE CROSS/BLUE SHIELD | Admitting: *Deleted

## 2014-12-23 DIAGNOSIS — I5022 Chronic systolic (congestive) heart failure: Secondary | ICD-10-CM

## 2014-12-23 DIAGNOSIS — I10 Essential (primary) hypertension: Secondary | ICD-10-CM

## 2014-12-23 LAB — CBC WITH DIFFERENTIAL/PLATELET
Basophils Absolute: 0 10*3/uL (ref 0.0–0.1)
Basophils Relative: 0.5 % (ref 0.0–3.0)
Eosinophils Absolute: 0.4 10*3/uL (ref 0.0–0.7)
Eosinophils Relative: 4.5 % (ref 0.0–5.0)
HCT: 42 % (ref 39.0–52.0)
Hemoglobin: 14.5 g/dL (ref 13.0–17.0)
Lymphocytes Relative: 23.4 % (ref 12.0–46.0)
Lymphs Abs: 2.1 10*3/uL (ref 0.7–4.0)
MCHC: 34.6 g/dL (ref 30.0–36.0)
MCV: 87.4 fl (ref 78.0–100.0)
Monocytes Absolute: 0.6 10*3/uL (ref 0.1–1.0)
Monocytes Relative: 7.1 % (ref 3.0–12.0)
Neutro Abs: 5.9 10*3/uL (ref 1.4–7.7)
Neutrophils Relative %: 64.5 % (ref 43.0–77.0)
Platelets: 194 10*3/uL (ref 150.0–400.0)
RBC: 4.8 Mil/uL (ref 4.22–5.81)
RDW: 15.1 % (ref 11.5–15.5)
WBC: 9.1 10*3/uL (ref 4.0–10.5)

## 2014-12-23 LAB — BASIC METABOLIC PANEL
BUN: 30 mg/dL — ABNORMAL HIGH (ref 6–23)
CO2: 27 mEq/L (ref 19–32)
Calcium: 9.5 mg/dL (ref 8.4–10.5)
Chloride: 98 mEq/L (ref 96–112)
Creatinine, Ser: 1.44 mg/dL (ref 0.40–1.50)
GFR: 56.04 mL/min — ABNORMAL LOW (ref >60.00)
Glucose, Bld: 227 mg/dL — ABNORMAL HIGH (ref 70–99)
Potassium: 4.6 mEq/L (ref 3.5–5.1)
Sodium: 132 mEq/L — ABNORMAL LOW (ref 135–145)

## 2014-12-29 ENCOUNTER — Ambulatory Visit (INDEPENDENT_AMBULATORY_CARE_PROVIDER_SITE_OTHER): Payer: BLUE CROSS/BLUE SHIELD | Admitting: Sports Medicine

## 2014-12-29 ENCOUNTER — Encounter: Payer: Self-pay | Admitting: Sports Medicine

## 2014-12-29 VITALS — BP 120/62 | HR 69 | Ht 70.0 in | Wt 296.0 lb

## 2014-12-29 DIAGNOSIS — E038 Other specified hypothyroidism: Secondary | ICD-10-CM

## 2014-12-29 DIAGNOSIS — I10 Essential (primary) hypertension: Secondary | ICD-10-CM

## 2014-12-29 DIAGNOSIS — E119 Type 2 diabetes mellitus without complications: Secondary | ICD-10-CM

## 2014-12-29 DIAGNOSIS — M109 Gout, unspecified: Secondary | ICD-10-CM | POA: Diagnosis not present

## 2014-12-29 DIAGNOSIS — Z6841 Body Mass Index (BMI) 40.0 and over, adult: Secondary | ICD-10-CM | POA: Diagnosis not present

## 2014-12-29 DIAGNOSIS — Z7982 Long term (current) use of aspirin: Secondary | ICD-10-CM | POA: Diagnosis not present

## 2014-12-29 DIAGNOSIS — I5022 Chronic systolic (congestive) heart failure: Secondary | ICD-10-CM

## 2014-12-29 DIAGNOSIS — I447 Left bundle-branch block, unspecified: Secondary | ICD-10-CM | POA: Diagnosis not present

## 2014-12-29 DIAGNOSIS — E669 Obesity, unspecified: Secondary | ICD-10-CM | POA: Diagnosis not present

## 2014-12-29 DIAGNOSIS — E039 Hypothyroidism, unspecified: Secondary | ICD-10-CM

## 2014-12-29 DIAGNOSIS — Z79899 Other long term (current) drug therapy: Secondary | ICD-10-CM | POA: Diagnosis not present

## 2014-12-29 DIAGNOSIS — I429 Cardiomyopathy, unspecified: Secondary | ICD-10-CM | POA: Diagnosis not present

## 2014-12-29 LAB — TSH: TSH: 2 u[IU]/mL (ref 0.350–4.500)

## 2014-12-29 LAB — COMPREHENSIVE METABOLIC PANEL
ALT: 27 U/L (ref 0–53)
AST: 21 U/L (ref 0–37)
Albumin: 4.2 g/dL (ref 3.5–5.2)
Alkaline Phosphatase: 50 U/L (ref 39–117)
BUN: 22 mg/dL (ref 6–23)
CO2: 26 mEq/L (ref 19–32)
Calcium: 9.4 mg/dL (ref 8.4–10.5)
Chloride: 100 mEq/L (ref 96–112)
Creat: 1.2 mg/dL (ref 0.50–1.35)
Glucose, Bld: 172 mg/dL — ABNORMAL HIGH (ref 70–99)
Potassium: 4.7 mEq/L (ref 3.5–5.3)
Sodium: 137 mEq/L (ref 135–145)
Total Bilirubin: 0.8 mg/dL (ref 0.2–1.2)
Total Protein: 7.5 g/dL (ref 6.0–8.3)

## 2014-12-29 LAB — LIPID PANEL
Cholesterol: 155 mg/dL (ref 0–200)
HDL: 33 mg/dL — ABNORMAL LOW (ref >=40)
LDL Cholesterol: 100 mg/dL — ABNORMAL HIGH (ref 0–99)
Total CHOL/HDL Ratio: 4.7 Ratio
Triglycerides: 110 mg/dL (ref <150)
VLDL: 22 mg/dL (ref 0–40)

## 2014-12-29 LAB — HEMOGLOBIN A1C
Hgb A1c MFr Bld: 6.6 % — ABNORMAL HIGH (ref <5.7)
Mean Plasma Glucose: 143 mg/dL — ABNORMAL HIGH (ref <117)

## 2014-12-29 LAB — T4, FREE: Free T4: 1.1 ng/dL (ref 0.80–1.80)

## 2014-12-29 LAB — T3, FREE: T3, Free: 2.8 pg/mL (ref 2.3–4.2)

## 2014-12-29 MED ORDER — MUPIROCIN 2 % EX OINT
1.0000 "application " | TOPICAL_OINTMENT | Freq: Once | CUTANEOUS | Status: AC
  Administered 2014-12-30: 1 via TOPICAL
  Filled 2014-12-29: qty 22

## 2014-12-29 MED ORDER — DEXTROSE 5 % IV SOLN
3.0000 g | INTRAVENOUS | Status: DC
  Filled 2014-12-29: qty 3000

## 2014-12-29 MED ORDER — CHLORHEXIDINE GLUCONATE 4 % EX LIQD
60.0000 mL | Freq: Once | CUTANEOUS | Status: DC
  Filled 2014-12-29: qty 60

## 2014-12-29 MED ORDER — SODIUM CHLORIDE 0.9 % IR SOLN
80.0000 mg | Status: DC
  Filled 2014-12-29: qty 2

## 2014-12-29 MED ORDER — SODIUM CHLORIDE 0.9 % IV SOLN
INTRAVENOUS | Status: DC
  Administered 2014-12-30: 07:00:00 via INTRAVENOUS

## 2014-12-29 NOTE — Assessment & Plan Note (Signed)
Continue all diabetes medications. Checking A1c today.

## 2014-12-29 NOTE — Progress Notes (Signed)
  Subjective:    CC: Follow-up  HPI: Chronic systolic heart failure: On a good medical regimen, asymptomatic, euvolemic, and scheduled for ICD placement tomorrow.  Hypertension: Well controlled, has not been taking amlodipine.  Subclinical hypothyroidism: Due for a TSH recheck.  Diabetes mellitus type 2: We increased his into, at the last visit, A1c was 7, we were looking for improved control.  Past medical history, Surgical history, Family history not pertinant except as noted below, Social history, Allergies, and medications have been entered into the medical record, reviewed, and no changes needed.   Review of Systems: No fevers, chills, night sweats, weight loss, chest pain, or shortness of breath.   Objective:    General: Well Developed, well nourished, and in no acute distress.  Neuro: Alert and oriented x3, extra-ocular muscles intact, sensation grossly intact.  HEENT: Normocephalic, atraumatic, pupils equal round reactive to light, neck supple, no masses, no lymphadenopathy, thyroid nonpalpable.  Skin: Warm and dry, no rashes. Cardiac: Regular rate and rhythm, no murmurs rubs or gallops, no lower extremity edema.  Respiratory: Clear to auscultation bilaterally. Not using accessory muscles, speaking in full sentences.  Impression and Recommendations:

## 2014-12-29 NOTE — Assessment & Plan Note (Signed)
Rechecking TSH today. Again, studies show the treatment of subclinical hypothyroidism decreases cardiovascular mortality.

## 2014-12-29 NOTE — Assessment & Plan Note (Signed)
Patient has self discontinued amlodipine however with lisinopril and beta blocker blood pressure continues to be well-controlled, discontinue amlodipine.

## 2014-12-29 NOTE — Assessment & Plan Note (Signed)
Overall doing well and on a good regimen of the beta blocker, ACE inhibitor, and Aldactone. Ejection fraction is less than 30% so he is getting an implantable cardioverter defibrillator tomorrow. Appears euvolemic on exam today, no further changes from a primary care perspective.

## 2014-12-30 ENCOUNTER — Encounter (HOSPITAL_COMMUNITY)
Admission: RE | Disposition: A | Discharge status: Home / Self Care | Payer: Self-pay | Source: Ambulatory Visit | Attending: Internal Medicine

## 2014-12-30 ENCOUNTER — Encounter (HOSPITAL_COMMUNITY): Payer: Self-pay | Admitting: General Practice

## 2014-12-30 ENCOUNTER — Ambulatory Visit (HOSPITAL_COMMUNITY)
Admission: RE | Admit: 2014-12-30 | Discharge: 2014-12-31 | Disposition: A | Discharge status: Home / Self Care | Payer: BLUE CROSS/BLUE SHIELD | Source: Ambulatory Visit | Attending: Internal Medicine | Admitting: Internal Medicine

## 2014-12-30 DIAGNOSIS — I447 Left bundle-branch block, unspecified: Secondary | ICD-10-CM | POA: Insufficient documentation

## 2014-12-30 DIAGNOSIS — I10 Essential (primary) hypertension: Secondary | ICD-10-CM

## 2014-12-30 DIAGNOSIS — Z79899 Other long term (current) drug therapy: Secondary | ICD-10-CM | POA: Insufficient documentation

## 2014-12-30 DIAGNOSIS — Z95810 Presence of automatic (implantable) cardiac defibrillator: Secondary | ICD-10-CM

## 2014-12-30 DIAGNOSIS — Z7982 Long term (current) use of aspirin: Secondary | ICD-10-CM | POA: Insufficient documentation

## 2014-12-30 DIAGNOSIS — I429 Cardiomyopathy, unspecified: Secondary | ICD-10-CM | POA: Diagnosis not present

## 2014-12-30 DIAGNOSIS — Z6841 Body Mass Index (BMI) 40.0 and over, adult: Secondary | ICD-10-CM | POA: Insufficient documentation

## 2014-12-30 DIAGNOSIS — I5022 Chronic systolic (congestive) heart failure: Secondary | ICD-10-CM | POA: Diagnosis present

## 2014-12-30 DIAGNOSIS — E119 Type 2 diabetes mellitus without complications: Secondary | ICD-10-CM | POA: Insufficient documentation

## 2014-12-30 DIAGNOSIS — M109 Gout, unspecified: Secondary | ICD-10-CM | POA: Insufficient documentation

## 2014-12-30 DIAGNOSIS — E669 Obesity, unspecified: Secondary | ICD-10-CM | POA: Insufficient documentation

## 2014-12-30 HISTORY — DX: Left bundle-branch block, unspecified: I44.7

## 2014-12-30 HISTORY — PX: IMPLANTABLE CARDIOVERTER DEFIBRILLATOR IMPLANT: SHX5473

## 2014-12-30 LAB — GLUCOSE, CAPILLARY
Glucose-Capillary: 196 mg/dL — ABNORMAL HIGH (ref 70–99)
Glucose-Capillary: 97 mg/dL (ref 70–99)
Glucose-Capillary: 218 mg/dL — ABNORMAL HIGH (ref 70–99)

## 2014-12-30 LAB — SURGICAL PCR SCREEN
MRSA, PCR: NEGATIVE
Staphylococcus aureus: NEGATIVE

## 2014-12-30 SURGERY — IMPLANTABLE CARDIOVERTER DEFIBRILLATOR IMPLANT

## 2014-12-30 MED ORDER — FUROSEMIDE 40 MG PO TABS
40.0000 mg | ORAL_TABLET | ORAL | Status: DC
  Administered 2014-12-30: 40 mg via ORAL
  Filled 2014-12-30: qty 1

## 2014-12-30 MED ORDER — HEPARIN (PORCINE) IN NACL 2-0.9 UNIT/ML-% IJ SOLN
INTRAMUSCULAR | Status: AC
  Filled 2014-12-30: qty 500

## 2014-12-30 MED ORDER — MIDAZOLAM HCL 5 MG/5ML IJ SOLN
INTRAMUSCULAR | Status: AC
  Filled 2014-12-30: qty 5

## 2014-12-30 MED ORDER — ASPIRIN EC 81 MG PO TBEC
81.0000 mg | DELAYED_RELEASE_TABLET | Freq: Every day | ORAL | Status: DC
  Administered 2014-12-30 – 2014-12-31 (×2): 81 mg via ORAL
  Filled 2014-12-30 (×2): qty 1

## 2014-12-30 MED ORDER — LISINOPRIL 2.5 MG PO TABS
2.5000 mg | ORAL_TABLET | Freq: Every day | ORAL | Status: DC
  Administered 2014-12-30 – 2014-12-31 (×2): 2.5 mg via ORAL
  Filled 2014-12-30 (×2): qty 1

## 2014-12-30 MED ORDER — LIDOCAINE HCL (PF) 1 % IJ SOLN
INTRAMUSCULAR | Status: AC
  Filled 2014-12-30: qty 60

## 2014-12-30 MED ORDER — CARVEDILOL 25 MG PO TABS
25.0000 mg | ORAL_TABLET | Freq: Two times a day (BID) | ORAL | Status: DC
  Administered 2014-12-30 – 2014-12-31 (×2): 25 mg via ORAL
  Filled 2014-12-30 (×2): qty 1

## 2014-12-30 MED ORDER — FENTANYL CITRATE 0.05 MG/ML IJ SOLN
INTRAMUSCULAR | Status: AC
  Filled 2014-12-30: qty 2

## 2014-12-30 MED ORDER — ACETAMINOPHEN 325 MG PO TABS: 325.0000 mg | ORAL_TABLET | ORAL | Status: DC | PRN

## 2014-12-30 MED ORDER — ALLOPURINOL 300 MG PO TABS
300.0000 mg | ORAL_TABLET | Freq: Two times a day (BID) | ORAL | Status: DC
Start: 2014-12-30 — End: 2014-12-31
  Administered 2014-12-30 – 2014-12-31 (×3): 300 mg via ORAL
  Filled 2014-12-30 (×3): qty 1

## 2014-12-30 MED ORDER — INSULIN ASPART 100 UNIT/ML ~~LOC~~ SOLN
0.0000 [IU] | Freq: Three times a day (TID) | SUBCUTANEOUS | Status: DC
  Administered 2014-12-31: 3 [IU] via SUBCUTANEOUS

## 2014-12-30 MED ORDER — SPIRONOLACTONE 25 MG PO TABS
25.0000 mg | ORAL_TABLET | Freq: Two times a day (BID) | ORAL | Status: DC
  Administered 2014-12-30 – 2014-12-31 (×2): 25 mg via ORAL
  Filled 2014-12-30 (×2): qty 1

## 2014-12-30 MED ORDER — GLIPIZIDE 10 MG PO TABS
10.0000 mg | ORAL_TABLET | Freq: Every day | ORAL | Status: DC
  Administered 2014-12-31: 10 mg via ORAL
  Filled 2014-12-30: qty 1

## 2014-12-30 MED ORDER — CEFAZOLIN SODIUM-DEXTROSE 2-3 GM-% IV SOLR
2.0000 g | Freq: Four times a day (QID) | INTRAVENOUS | Status: AC
  Administered 2014-12-30 – 2014-12-31 (×3): 2 g via INTRAVENOUS
  Filled 2014-12-30 (×3): qty 50

## 2014-12-30 MED ORDER — LEVOTHYROXINE SODIUM 50 MCG PO TABS
50.0000 ug | ORAL_TABLET | Freq: Every day | ORAL | Status: DC
  Administered 2014-12-31: 50 ug via ORAL
  Filled 2014-12-30: qty 1

## 2014-12-30 MED ORDER — ONDANSETRON HCL 4 MG/2ML IJ SOLN: 4.0000 mg | Freq: Four times a day (QID) | INTRAMUSCULAR | Status: DC | PRN

## 2014-12-30 MED ORDER — MUPIROCIN 2 % EX OINT
TOPICAL_OINTMENT | CUTANEOUS | Status: AC
  Filled 2014-12-30: qty 22

## 2014-12-30 MED ORDER — HEPARIN (PORCINE) IN NACL 2-0.9 UNIT/ML-% IJ SOLN
INTRAMUSCULAR | Status: AC
  Filled 2014-12-30: qty 1000

## 2014-12-30 NOTE — H&P (Signed)
  ICD Criteria  Current LVEF:25% ;Obtained > or = 1 month ago and < or = 3 months ago.  NYHA Functional Classification: Class II  Heart Failure History:  Yes, Duration of heart failure since onset is > 9 months  Non-Ischemic Dilated Cardiomyopathy History:  Yes, timeframe is > 9 months  Atrial Fibrillation/Atrial Flutter:  No.  Ventricular Tachycardia History:  No.  Cardiac Arrest History:  No  History of Syndromes with Risk of Sudden Death:  No.  Previous ICD:  No.  Electrophysiology Study: No.  Prior MI: No.  PPM: No.  OSA:  No  Patient Life Expectancy of >=1 year: Yes.  Anticoagulation Therapy:  Patient is NOT on anticoagulation therapy.   Beta Blocker Therapy:  Yes.   Ace Inhibitor/ARB Therapy:  Yes.

## 2014-12-30 NOTE — CV Procedure (Signed)
SURGEON:  Lewayne Bunting, MD      PREPROCEDURE DIAGNOSES:   1. Nonischemic cardiomyopathy. EF 25%  2. New York Heart Association class II, heart failure chronically.   3. Left bundle-branch block.      POSTPROCEDURE DIAGNOSES:   1. Nonischemic cardiomyopathy. EF 25%  2. New York Heart Association class III heart failure chronically.   3. Left bundle-branch block.      PROCEDURES:    1. Biventricular ICD implantation.  2. Defibrillation threshold testing 3. Venography of the coronary sinus     INTRODUCTION:  Andre [name]. is a 49 y.o. male with a nonischemic CM (EF 25), NYHA Class III CHF, and LBBB QRS morophology. At this time, he meets MADIT II/ SCD-HeFT criteria for ICD implantation for primary prevention of sudden death.  Given LBBB, the patient may also be expected to benefit from resynchronization therapy. The patient has been treated with an optimal medical regimen but continues to have a depressed ejection fraction and NYHA Class III CHF symptoms.  he therefore  presents today for a biventricular ICD implantation.      DESCRIPTION OF PROCEDURE:  Informed written consent was obtained and the   patient was brought to the electrophysiology lab in the fasting state. The patient was adequately sedated with intravenous Versed and Fentanyl as outlined in the nursing report.  The patient's left chest was prepped and draped in the usual sterile fashion by the EP lab staff.  The skin overlying the left deltopectoral region was infiltrated with lidocaine for local analgesia.  A 6-cm incision was made over the left deltopectoral region.  A left subcutaneous defibrillator pocket was fashioned using a combination of sharp and blunt dissection.  Electrocautery was used to assure hemostasis.    RA/RV Lead Placement: The left axillary vein was cannulated with fluoroscopic visualization.  No contrast was required for this endeavor.  Through the left axillary vein, a St. Jude [id number] (serial #  [id number]  ) right atrial lead and a St. Jude (serial number [id number]) right ventricular defibrillator lead were advanced with fluoroscopic visualization into the right atrial appendage and right ventricular apical septal positions respectively.  Initial atrial lead P-waves measured 6.9 mV with an impedance of 484 ohms and a threshold of 0.5 volts at 0.5 milliseconds.  The right ventricular lead R-wave measured 35 mV with impedance of 812 ohms and a threshold of 0.5 volts at 0.5 milliseconds.   LV Lead Placement:  A St. Jude guide was advanced through the left axillary vein into the low lateral right atrium. A 6 french hexapolar EP catheter was introduced through the St.Jude guide and used to cannulate the coronary sinus. A coronary sinus nonselective venogram was performed by hand injection of nonionic contrast. This demonstrated a small lateral vein and an average sized posterior vein. The guide wire could access the lateral vein but the lead could not be advanced beyond a stenotic area and there was a 150 degree turn..  A 0.014 angioplasty guide wire was introduced through the St. Jude Guide and advanced into the posterior vein. A St. Jude(serial number [id number]) lead was advanced through the posterior branch. This was approximately two-thirds from the base to the apex in a very lateral  position. In this location with 1-4 bipolar configuration, the left ventricular lead R-waves measured 7 mV with impedance of 641 ohms and a threshold of 1.5 volt at 0.5 Milliseconds with no diaphragmatic stimulation observed when pacing at 10 volts output. The  St. Jude guide was therefore removed.  All three leads were secured to the pectoralis fascia using #2 silk suture over the suture sleeves. The pocket then irrigated with copious gentamicin solution.   Device Placement: The leads were then  connected to a St. Jude (serial  Number [id number]) biventricular ICD.  The defibrillator was placed into the  pocket.  The  pocket was then closed in 2 layers with 2.0 Vicryl suture  for the subcutaneous and subcuticular layers.  Steri-Strips and a  sterile dressing were then applied.   DFT Testing: Defibrillation Threshold testing was then performed. Ventricular fibrillation was induced with a T shock.  Adequate sensing of ventricular  fibrillation was observed with minimal dropout with a programmed sensitivity of 1.9mV.  The patient was successfully defibrillated to sinus rhythm with a single 20 joule shock delivered from the device with an impedance of 90 ohms in a duration of 4.0 seconds.  The patient remained in sinus rhythm thereafter.        CONCLUSIONS:   1. Nonischemic cardiomyopathy with Left bundle-branch block and chronic New York Heart Association class II heart failure.   2. Successful biventricular ICD implantation.   3. DFT less than or equal to 20 joules.   4. No early apparent complications.   Lewayne Bunting, MD  2:17 PM 12/30/2014

## 2014-12-30 NOTE — H&P (Signed)
HPI [name] is referred today by Dr. Tenny Craw to consider ICD implant. The patient is a 49 yo man with chronic systolic heart failure dating back to 2004. He has had progressive LV dysfunction and heart failure symptoms. He had a negative myoview recently. The patient has been on optimall therapy with an ACE inhibitor, beta blocker and aldactone. His EF is 20-25% and he has class 2 heart failure symptoms. Previously his QRS duration was 125 ms and now is 155 ms. He has never had syncope.  No Known Allergies   Current Outpatient Prescriptions  Medication Sig Dispense Refill  . allopurinol (ZYLOPRIM) 300 MG tablet Take 1 tablet (300 mg total) by mouth 2 (two) times daily. 60 tablet 3  . amLODipine (NORVASC) 10 MG tablet Take 1 tablet (10 mg total) by mouth daily. 30 tablet 11  . aspirin 81 MG tablet Take 81 mg by mouth daily.    . canagliflozin (INVOKANA) 300 MG TABS tablet Take 300 mg by mouth daily before breakfast. 30 tablet 3  . carvedilol (COREG) 25 MG tablet Take 1 tablet (25 mg total) by mouth 2 (two) times daily with a meal. 180 tablet 3  . furosemide (LASIX) 40 MG tablet Take 40 mg by mouth 2 (two) times a week. Patient takes 40mg  by mouth on Monday and Thursday    . glipiZIDE (GLUCOTROL) 10 MG tablet Take 1 tablet (10 mg total) by mouth daily before breakfast. 90 tablet 3  . levothyroxine (SYNTHROID, LEVOTHROID) 50 MCG tablet Take 50 mcg by mouth daily before breakfast.    . lisinopril (ZESTRIL) 2.5 MG tablet Take 1 tablet (2.5 mg total) by mouth daily. 90 tablet 3  . meloxicam (MOBIC) 15 MG tablet One tab PO qAM with breakfast for 2 weeks, then daily prn pain. 30 tablet 3  . Multiple Vitamins-Minerals (MULTIVITAMIN WITH MINERALS) tablet Take 1 tablet by mouth daily.    Marland Kitchen spironolactone (ALDACTONE) 25 MG tablet Take 1 tablet (25 mg total) by mouth 2 (two) times daily. 180 tablet 3   No current  facility-administered medications for this visit.     Past Medical History  Diagnosis Date  . Hypertension   . Diabetes mellitus without complication   . Gout   . CHF (congestive heart failure)     ROS:  All systems reviewed and negative except as noted in the HPI.   Past Surgical History  Procedure Laterality Date  . Hip surgery    . Wrist surgery       Family History  Problem Relation Age of Onset  . Heart attack Father   . Hypertension Father   . Depression Sister   . Alcohol abuse Maternal Grandmother   . Alcohol abuse Maternal Grandfather      History   Social History  . Marital Status: Married    Spouse Name: N/A  . Number of Children: N/A  . Years of Education: N/A   Occupational History  . Not on file.   Social History Main Topics  . Smoking status: Never Smoker   . Smokeless tobacco: Not on file  . Alcohol Use: Yes  . Drug Use: Not on file  . Sexual Activity: Not on file   Other Topics Concern  . Not on file   Social History Narrative     BP 118/80 mmHg  Pulse 67  Ht 5\' 10"  (1.778 m)  Wt 297 lb (134.718 kg)  BMI 42.61 kg/m2  Physical Exam:  Well appearing 49 yo man, NAD HEENT: Unremarkable Neck: No JVD, no thyromegally Back: No CVA tenderness Lungs: Clear with no wheezes HEART: Regular rate rhythm, no murmurs, no rubs, no clicks Abd: soft, positive bowel sounds, no organomegally, no rebound, no guarding Ext: 2 plus pulses, no edema, no cyanosis, no clubbing Skin: No rashes no nodules Neuro: CN II through XII intact, motor grossly intact  EKG - nsr with LBBB   Assess/Plan:            Chronic systolic heart failure - Marinus Maw, MD at 11/16/2014 9:14 AM     Status: Written Related Problem: Chronic systolic heart failure   Expand All Collapse All   His symptoms are class 2 and his QRS duration is  155 ms with LBBB and his EF is 20% by echo. He is on maximal medical therapy. I have discussed the risks/benefits/goals/expectations of BIV ICD implant and he wishes to proceed.             Essential hypertension, benign - Marinus Maw, MD at 11/16/2014 9:15 AM     Status: Written Related Problem: Essential hypertension, benign   Expand All Collapse All   His blood pressure is well controlled. Will follow. He will continue his current meds. He may need to work on dietary indiscretion       Leonia Reeves.D.

## 2014-12-31 ENCOUNTER — Ambulatory Visit (HOSPITAL_COMMUNITY): Payer: BLUE CROSS/BLUE SHIELD

## 2014-12-31 DIAGNOSIS — I429 Cardiomyopathy, unspecified: Secondary | ICD-10-CM | POA: Diagnosis not present

## 2014-12-31 DIAGNOSIS — I10 Essential (primary) hypertension: Secondary | ICD-10-CM | POA: Diagnosis not present

## 2014-12-31 DIAGNOSIS — I447 Left bundle-branch block, unspecified: Secondary | ICD-10-CM | POA: Diagnosis not present

## 2014-12-31 DIAGNOSIS — I5022 Chronic systolic (congestive) heart failure: Secondary | ICD-10-CM | POA: Diagnosis not present

## 2014-12-31 LAB — GLUCOSE, CAPILLARY: Glucose-Capillary: 190 mg/dL — ABNORMAL HIGH (ref 70–99)

## 2014-12-31 NOTE — Discharge Summary (Signed)
ELECTROPHYSIOLOGY PROCEDURE DISCHARGE SUMMARY    Patient ID: Andre Ehrman.,  MRN: [id number], DOB/AGE: [birth year] 49 y.o.  Admit date: 12/30/2014 Discharge date: 12/31/2014  Primary Care Physician: Rodney Langton, MD Primary Cardiologist: Tenny Craw Electrophysiologist: Ladona Ridgel  Primary Discharge Diagnosis:  Non ischemic cardiomyopathy, congestive heart failure, and LBBB status post CRTD implantation this admission  Secondary Discharge Diagnosis:  1.  Hypertension 2.  Obesity 3.  Gout  No Known Allergies   Procedures This Admission:  1.  Implantation of a STJ CRTD on 12-30-14 by Dr Ladona Ridgel.  See op note for full details.  DFT's were successful at 20 J.  There were no immediate post procedure complications. 2.  CXR on 12-31-14 demonstrated no pneumothorax status post device implantation.   Brief HPI: Andre Rhine. is a 49 y.o. male was referred to electrophysiology in the outpatient setting for consideration of ICD implantation.  Past medical history includes non-ischemic cardiomyopathy, congestive heart failure, and LBBB.  The patient has persistent LV dysfunction despite guideline directed therapy.  Risks, benefits, and alternatives to CRTD implantation were reviewed with the patient who wished to proceed.   Hospital Course:  The patient was admitted and underwent implantation of a STJ CRTD with details as outlined above. He was monitored on telemetry overnight which demonstrated sinus rhythm with ventricular pacing and PVC's.  Left chest was without hematoma or ecchymosis.  The device was interrogated and found to be functioning normally.  CXR was obtained and demonstrated no pneumothorax status post device implantation.  Wound care, arm mobility, and restrictions were reviewed with the patient.  The patient was examined and considered stable for discharge to home.   The patient's discharge medications include an ACE-I (Lisinopril) and beta blocker (Carvedilol).    Physical Exam: Filed Vitals:   12/31/14 0016 12/31/14 0400 12/31/14 0447 12/31/14 0745  BP: 103/81 128/83 136/77   Pulse: 95 75 44   Temp: 97.7 F (36.5 C) 98.7 F (37.1 C)  98.6 F (37 C)  TempSrc: Oral Oral  Oral  Resp: Height:      Weight:  249 lb 1.9 oz (113 kg)    SpO2: 99% 98% 93%     GEN- The patient is obese appearing, alert and oriented x 3 today.   HEENT: normocephalic, atraumatic; sclera clear, conjunctiva pink; hearing intact; oropharynx clear; neck supple  Lungs- Clear to ausculation bilaterally, normal work of breathing.  No wheezes, rales, rhonchi Heart- Regular rate and rhythm, no murmurs, rubs or gallops GI- soft, non-tender, non-distended, bowel sounds present  Extremities- no clubbing, cyanosis, or edema  MS- no significant deformity or atrophy Skin- warm and dry, no rash or lesion, left chest without hematoma/ecchymosis Psych- euthymic mood, full affect Neuro- strength and sensation are intact   Labs:   Lab Results  Component Value Date   WBC 9.1 12/23/2014   HGB 14.5 12/23/2014   HCT 42.0 12/23/2014   MCV 87.4 12/23/2014   PLT 194.0 12/23/2014     Recent Labs Lab 12/29/14 0848  NA 137  K 4.7  CL 100  CO2 26  BUN 22  CREATININE 1.20  CALCIUM 9.4  PROT 7.5  BILITOT 0.8  ALKPHOS 50  ALT 27  AST 21  GLUCOSE 172*    Discharge Medications:    Medication List    TAKE these medications        allopurinol 300 MG tablet  Commonly known as:  ZYLOPRIM  Take 1  tablet (300 mg total) by mouth 2 (two) times daily.     aspirin EC 81 MG tablet  Take 81 mg by mouth daily.     canagliflozin 300 MG Tabs tablet  Commonly known as:  INVOKANA  Take 300 mg by mouth daily before breakfast.     carvedilol 25 MG tablet  Commonly known as:  COREG  Take 1 tablet (25 mg total) by mouth 2 (two) times daily with a meal.     furosemide 40 MG tablet  Commonly known as:  LASIX  Take 40 mg by mouth 2 (two) times a week. Monday and  Thursday     glipiZIDE 10 MG tablet  Commonly known as:  GLUCOTROL  Take 1 tablet (10 mg total) by mouth daily before breakfast.     levothyroxine 50 MCG tablet  Commonly known as:  SYNTHROID, LEVOTHROID  Take 50 mcg by mouth daily before breakfast.     lisinopril 2.5 MG tablet  Commonly known as:  ZESTRIL  Take 1 tablet (2.5 mg total) by mouth daily.     meloxicam 15 MG tablet  Commonly known as:  MOBIC  One tab PO qAM with breakfast for 2 weeks, then daily prn pain.     spironolactone 25 MG tablet  Commonly known as:  ALDACTONE  Take 1 tablet (25 mg total) by mouth 2 (two) times daily.        Disposition:  Discharge Instructions    Diet - low sodium heart healthy    Complete by:  As directed      Increase activity slowly    Complete by:  As directed           Follow-up Information    Follow up with Marily Lente, NP On 01/10/2015.   Specialty:  Nurse Practitioner   Why:  at 9:30AM   Contact information:   [address] [place] [address] [phone]       Duration of Discharge Encounter: Greater than 30 minutes including physician time.  Signed, Gypsy Balsam, NP 12/31/2014 8:01 AM     I have seen, examined the patient, and reviewed the above assessment and plan.  Changes to above are made where necessary.    Co Sign: Hillis Range, MD 12/31/2014 10:24 AM

## 2014-12-31 NOTE — Progress Notes (Signed)
Pt is ready for DC home accompanied by significant other. Pt reports he understands all DC medications, follow up appointments, and DC instructions.   Ulice Dash, Charity fundraiser

## 2014-12-31 NOTE — Discharge Instructions (Signed)
° ° °  Supplemental Discharge Instructions for  Pacemaker/Defibrillator Patients  Activity No heavy lifting or vigorous activity with your left/right arm for 6 to 8 weeks.  Do not raise your left/right arm above your head for one week.  Gradually raise your affected arm as drawn below.           __          01/03/15               01/04/15                01/05/15                        01/06/15  NO DRIVING for 1 week    ; you may begin driving on  2/86/38   .  WOUND CARE - Keep the wound area clean and dry.  Do not get this area wet for one week. No showers for one week; you may shower on   01/06/15  . - The tape/steri-strips on your wound will fall off; do not pull them off.  No bandage is needed on the site.  DO  NOT apply any creams, oils, or ointments to the wound area. - If you notice any drainage or discharge from the wound, any swelling or bruising at the site, or you develop a fever > 101? F after you are discharged home, call the office at once.  Special Instructions - You are still able to use cellular telephones; use the ear opposite the side where you have your pacemaker/defibrillator.  Avoid carrying your cellular phone near your device. - When traveling through airports, show security personnel your identification card to avoid being screened in the metal detectors.  Ask the security personnel to use the hand wand. - Avoid arc welding equipment, MRI testing (magnetic resonance imaging), TENS units (transcutaneous nerve stimulators).  Call the office for questions about other devices. - Avoid electrical appliances that are in poor condition or are not properly grounded. - Microwave ovens are safe to be near or to operate.  Additional information for defibrillator patients should your device go off: - If your device goes off ONCE and you feel fine afterward, notify the device clinic nurses. - If your device goes off ONCE and you do not feel well afterward, call 911. - If your device  goes off TWICE, call 911. - If your device goes off THREE times in one day, call 911.  DO NOT DRIVE YOURSELF OR A FAMILY MEMBER WITH A DEFIBRILLATOR TO THE HOSPITAL--CALL 911.

## 2015-01-03 LAB — GLUCOSE, CAPILLARY: Glucose-Capillary: 178 mg/dL — ABNORMAL HIGH (ref 70–99)

## 2015-01-10 ENCOUNTER — Ambulatory Visit: Payer: BLUE CROSS/BLUE SHIELD

## 2015-01-10 ENCOUNTER — Encounter: Payer: Self-pay | Admitting: Nurse Practitioner

## 2015-01-10 ENCOUNTER — Ambulatory Visit
Admission: RE | Admit: 2015-01-10 | Discharge: 2015-01-10 | Disposition: A | Discharge status: Home / Self Care | Payer: BLUE CROSS/BLUE SHIELD | Source: Ambulatory Visit | Attending: Nurse Practitioner | Admitting: Nurse Practitioner

## 2015-01-10 ENCOUNTER — Ambulatory Visit (INDEPENDENT_AMBULATORY_CARE_PROVIDER_SITE_OTHER): Payer: BLUE CROSS/BLUE SHIELD | Admitting: Nurse Practitioner

## 2015-01-10 VITALS — BP 100/68 | HR 79 | Ht 70.0 in | Wt 293.4 lb

## 2015-01-10 DIAGNOSIS — Z95810 Presence of automatic (implantable) cardiac defibrillator: Secondary | ICD-10-CM

## 2015-01-10 DIAGNOSIS — I10 Essential (primary) hypertension: Secondary | ICD-10-CM | POA: Diagnosis not present

## 2015-01-10 DIAGNOSIS — I5022 Chronic systolic (congestive) heart failure: Secondary | ICD-10-CM | POA: Diagnosis not present

## 2015-01-10 DIAGNOSIS — I493 Ventricular premature depolarization: Secondary | ICD-10-CM

## 2015-01-10 LAB — BASIC METABOLIC PANEL
BUN: 25 mg/dL — ABNORMAL HIGH (ref 6–23)
CO2: 27 mEq/L (ref 19–32)
Calcium: 9.6 mg/dL (ref 8.4–10.5)
Chloride: 96 mEq/L (ref 96–112)
Creatinine, Ser: 1.29 mg/dL (ref 0.40–1.50)
GFR: 63.61 mL/min (ref >60.00)
Glucose, Bld: 205 mg/dL — ABNORMAL HIGH (ref 70–99)
Potassium: 5.3 mEq/L — ABNORMAL HIGH (ref 3.5–5.1)
Sodium: 127 mEq/L — ABNORMAL LOW (ref 135–145)

## 2015-01-10 NOTE — Progress Notes (Signed)
Electrophysiology Office Note Date: 01/10/2015  ID:  Andre [name]., DOB Dec 14, 1965, MRN [id number]  PCP: Rodney Langton, MD Primary Cardiologist: Tenny Craw Electrophysiologist: Ladona Ridgel  CC:  ICD wound check and PVC follow up  Andre [name]. is a 49 y.o. male is seen today for Dr Ladona Ridgel.  He presents today for post hospital electrophysiology followup. He was admitted 12-30-14 for planned CRTD implant. He received a STJ CRTD and did well post procedure.  Since last being seen in our clinic, the patient reports doing relatively well. He reports fatigue and improving left wrist pain consistent with his gout symptoms but no chest pain, palpitations, dyspnea, PND, orthopnea, nausea, vomiting, dizziness, syncope.  He has not had ICD shocks.   Device History: STJ CRTD implanted 12-30-14 for non-ischemic cardiomyopathy, CHF, and LBBB History of appropriate therapy: No History of AAD therapy: No   Past Medical History  Diagnosis Date  . Hypertension   . Diabetes mellitus without complication   . Gout   . CHF (congestive heart failure)   . LBBB (left bundle branch block)   . Non-ischemic cardiomyopathy     a. s/p STJ CRTD   Past Surgical History  Procedure Laterality Date  . Hip surgery    . Wrist surgery    . Implantable cardioverter defibrillator implant N/A 12/30/2014    STJ CRTD implanted by Dr Ladona Ridgel    Current Outpatient Prescriptions  Medication Sig Dispense Refill  . allopurinol (ZYLOPRIM) 300 MG tablet Take 1 tablet (300 mg total) by mouth 2 (two) times daily. 60 tablet 3  . aspirin EC 81 MG tablet Take 81 mg by mouth daily.    . canagliflozin (INVOKANA) 300 MG TABS tablet Take 300 mg by mouth daily before breakfast. 30 tablet 3  . carvedilol (COREG) 25 MG tablet Take 1 tablet (25 mg total) by mouth 2 (two) times daily with a meal. 180 tablet 3  . furosemide (LASIX) 40 MG tablet Take 40 mg by mouth 2 (two) times a week. Monday and Thursday    . glipiZIDE (GLUCOTROL)  10 MG tablet Take 1 tablet (10 mg total) by mouth daily before breakfast. 90 tablet 3  . levothyroxine (SYNTHROID, LEVOTHROID) 50 MCG tablet Take 50 mcg by mouth daily before breakfast.    . lisinopril (ZESTRIL) 2.5 MG tablet Take 1 tablet (2.5 mg total) by mouth daily. 90 tablet 3  . meloxicam (MOBIC) 15 MG tablet One tab PO qAM with breakfast for 2 weeks, then daily prn pain. (Patient taking differently: Take 15 mg by mouth daily as needed (gout flare-up). ) 30 tablet 3  . spironolactone (ALDACTONE) 25 MG tablet Take 1 tablet (25 mg total) by mouth 2 (two) times daily. 180 tablet 3   No current facility-administered medications for this visit.    Allergies:   Review of patient's allergies indicates no known allergies.   Social History: History   Social History  . Marital Status: Single    Spouse Name: N/A  . Number of Children: N/A  . Years of Education: N/A   Occupational History  . Not on file.   Social History Main Topics  . Smoking status: Never Smoker   . Smokeless tobacco: Never Used  . Alcohol Use: Yes     Comment: social  . Drug Use: No  . Sexual Activity: Not on file   Other Topics Concern  . Not on file   Social History Narrative    Family History: Family History  Problem Relation Age of Onset  . Heart attack Father   . Hypertension Father   . Depression Sister   . Alcohol abuse Maternal Grandmother   . Alcohol abuse Maternal Grandfather     Review of Systems: All other systems reviewed and are otherwise negative except as noted above.   Physical Exam: VS:  BP 100/68 mmHg  Pulse 79  Ht  (1.778 m)  Wt 293 lb 6.4 oz (133.085 kg)  BMI 42.10 kg/m2  SpO2 98% , BMI Body mass index is 42.1 kg/(m^2).  GEN- The patient is obese appearing, alert and oriented x 3 today.   HEENT: normocephalic, atraumatic; sclera clear, conjunctiva pink; hearing intact; oropharynx clear; neck supple Lungs- Clear to ausculation bilaterally, normal work of breathing.   No wheezes, rales, rhonchi Heart- Regular rate and rhythm, no murmurs, rubs or gallops  GI- soft, non-tender, non-distended, bowel sounds present  Extremities- no clubbing, cyanosis, or edema; DP/PT/radial pulses 2+ bilaterally MS- no significant deformity or atrophy Skin- warm and dry, no rash or lesion; ICD pocket well healed Psych- euthymic mood, full affect Neuro- strength and sensation are intact  ICD interrogation- reviewed in detail today,  See PACEART report  EKG:  EKG is not ordered today.  Recent Labs: 04/05/2014: Pro B Natriuretic peptide (BNP) 70.0 12/23/2014: Hemoglobin 14.5; Platelets 194.0 12/29/2014: ALT 27; BUN 22; Creatinine 1.20; Potassium 4.7; Sodium 137; TSH 2.000   Wt Readings from Last 3 Encounters:  01/10/15 293 lb 6.4 oz (133.085 kg)  12/31/14 249 lb 1.9 oz (113 kg)  12/29/14 296 lb (134.265 kg)     Assessment and Plan:  1.  Chronic systolic dysfunction Normal CRTD function; LV threshold elevated from implant and impedence decreased, will check CXR today to evaluate LV lead position ICD pocket well healed See Pace Art report No changes today euvolemic today Stable on an appropriate medical regimen Plan repeat echo 10/16 at time of follow up with Dr Tenny Craw BMET today  2.  PVC's Burden by device interrogation today 4.8% which is improved from discharge Continue Coreg  3.  Hypertension Blood pressure low for patient today May be contributing to fatigue Will check BMET; may need to decrease Spironolactone. Would like to keep Coreg dose with PVC's  4.  Obesity Weight loss encouraged  Current medicines are reviewed at length with the patient today.   The patient does not have concerns regarding his medicines.  The following changes were made today:  none  Labs/ tests ordered today include: BMET, CXR   Disposition:   Follow up with Dr Ladona Ridgel 3 months; follow up Dr Tenny Craw 10/16 as scheduled with repeat echo at that time.    Signed, Gypsy Balsam,  NP 01/10/2015 9:53 AM  [facility] [address] [place] [address] [phone] (office) [phone] (fax)

## 2015-01-10 NOTE — Patient Instructions (Signed)
Medication Instructions:  Continue same.  Labwork: Today (BMET)  Testing/Procedures: A chest x-ray takes a picture of the organs and structures inside the chest, including the heart, lungs, and blood vessels. This test can show several things, including, whether the heart is enlarges; whether fluid is building up in the lungs; and whether pacemaker / defibrillator leads are still in place.   Follow-Up: Your physician recommends that you schedule a follow-up appointment in: 3 months.

## 2015-01-11 ENCOUNTER — Telehealth: Payer: Self-pay | Admitting: Internal Medicine

## 2015-01-11 NOTE — Telephone Encounter (Signed)
Follow up ° ° ° ° °Returning a nurses call °

## 2015-01-11 NOTE — Telephone Encounter (Signed)
Follow UP  Pt returning Rn phone call concerning lab work. Please call back and discuss.

## 2015-01-11 NOTE — Telephone Encounter (Signed)
Left message to call back  

## 2015-01-12 NOTE — Telephone Encounter (Signed)
Spoke with pt and made him aware of lab and chest xray results. Pt verbalized understanding.

## 2015-01-12 NOTE — Telephone Encounter (Signed)
Left message to call back  

## 2015-01-28 ENCOUNTER — Encounter: Payer: Self-pay | Admitting: Internal Medicine

## 2015-01-30 ENCOUNTER — Encounter: Payer: Self-pay | Admitting: Nurse Practitioner

## 2015-01-30 ENCOUNTER — Encounter: Payer: Self-pay | Admitting: Sports Medicine

## 2015-01-30 NOTE — Progress Notes (Signed)
Electrophysiology Office Note Date: 01/31/2015  ID:  Andre Slick., DOB [birth year], MRN [id number]  PCP: Rodney Langton, MD Primary Cardiologist: Tenny Craw Electrophysiologist: Ladona Ridgel  CC: Follow up for evaluation of LV threshold  Andre Slick. is a 49 y.o. male is seen today for Dr Ladona Ridgel.  He underwent CRTD implantation 12/30/14.  At wound check, his LV impedence was decreased and LV threshold was increased. CXR demonstrated stable lead position.  He presents today for re-evaluation of LV threshold.  Since last being seen in our clinic, the patient reports doing very well. His energy level and fatigue are much improved.  He denies chest pain, palpitations, dyspnea, PND, orthopnea, nausea, vomiting, dizziness, syncope, edema, weight gain, or early satiety.  He has not had ICD shocks.   Device History: STJ CRTD implanted 2016 for non-ischemic cardiomyopathy, LBBB History of appropriate therapy: no History of AAD therapy: no   Past Medical History  Diagnosis Date  . Hypertension   . Diabetes mellitus without complication   . Gout   . CHF (congestive heart failure)   . LBBB (left bundle branch block)   . Non-ischemic cardiomyopathy     a. s/p STJ CRTD  . PVC (premature ventricular contraction)    Past Surgical History  Procedure Laterality Date  . Hip surgery    . Wrist surgery    . Implantable cardioverter defibrillator implant N/A 12/30/2014    STJ CRTD implanted by Dr Ladona Ridgel    Current Outpatient Prescriptions  Medication Sig Dispense Refill  . allopurinol (ZYLOPRIM) 300 MG tablet Take 1 tablet (300 mg total) by mouth 2 (two) times daily. 60 tablet 3  . aspirin EC 81 MG tablet Take 81 mg by mouth daily.    . canagliflozin (INVOKANA) 300 MG TABS tablet Take 300 mg by mouth daily before breakfast. 30 tablet 3  . carvedilol (COREG) 25 MG tablet Take 1 tablet (25 mg total) by mouth 2 (two) times daily with a meal. 180 tablet 3  . furosemide (LASIX) 40 MG tablet  Take 40 mg by mouth 2 (two) times a week. Monday and Thursday    . glipiZIDE (GLUCOTROL) 10 MG tablet Take 1 tablet (10 mg total) by mouth daily before breakfast. 90 tablet 3  . levothyroxine (SYNTHROID, LEVOTHROID) 50 MCG tablet Take 1 tablet (50 mcg total) by mouth daily before breakfast. 90 tablet 3  . lisinopril (ZESTRIL) 2.5 MG tablet Take 1 tablet (2.5 mg total) by mouth daily. 90 tablet 3  . meloxicam (MOBIC) 15 MG tablet One tab PO qAM with breakfast for 2 weeks, then daily prn pain. (Patient taking differently: Take 15 mg by mouth daily as needed (gout flare-up). ) 30 tablet 3  . spironolactone (ALDACTONE) 25 MG tablet Take 1 tablet (25 mg total) by mouth 2 (two) times daily. 180 tablet 3   No current facility-administered medications for this visit.    Allergies:   Review of patient's allergies indicates no known allergies.   Social History: History   Social History  . Marital Status: Single    Spouse Name: N/A  . Number of Children: N/A  . Years of Education: N/A   Occupational History  . Not on file.   Social History Main Topics  . Smoking status: Never Smoker   . Smokeless tobacco: Never Used  . Alcohol Use: Yes     Comment: social  . Drug Use: No  . Sexual Activity: Not on file   Other Topics Concern  .  Not on file   Social History Narrative    Family History: Family History  Problem Relation Age of Onset  . Heart attack Father   . Hypertension Father   . Depression Sister   . Alcohol abuse Maternal Grandmother   . Alcohol abuse Maternal Grandfather     Review of Systems: All other systems reviewed and are otherwise negative except as noted above.   Physical Exam: VS:  BP 122/74 mmHg  Pulse 68  Ht 5\' 10"  (1.778 m)  Wt 303 lb (137.44 kg)  BMI 43.48 kg/m2  SpO2 99% , BMI Body mass index is 43.48 kg/(m^2).  GEN- The patient is obese appearing, alert and oriented x 3 today.   HEENT: normocephalic, atraumatic; sclera clear, conjunctiva pink;  hearing intact; oropharynx clear; neck supple, no JVP Lymph- no cervical lymphadenopathy Lungs- Clear to ausculation bilaterally, normal work of breathing.  No wheezes, rales, rhonchi Heart- Regular rate and rhythm, no murmurs, rubs or gallops, PMI not laterally displaced GI- soft, non-tender, non-distended, bowel sounds present, no hepatosplenomegaly Extremities- no clubbing, cyanosis, or edema; DP/PT/radial pulses 2+ bilaterally MS- no significant deformity or atrophy Skin- warm and dry, no rash or lesion; ICD pocket well healed Psych- euthymic mood, full affect Neuro- strength and sensation are intact  ICD interrogation- reviewed in detail today,  See PACEART report  EKG:  EKG is not ordered today  Recent Labs: 04/05/2014: Pro B Natriuretic peptide (BNP) 70.0 12/23/2014: Hemoglobin 14.5; Platelets 194.0 12/29/2014: ALT 27; TSH 2.000 01/10/2015: BUN 25*; Creatinine 1.29; Potassium 5.3*; Sodium 127*   Wt Readings from Last 3 Encounters:  01/31/15 303 lb (137.44 kg)  01/10/15 293 lb 6.4 oz (133.085 kg)  12/31/14 249 lb 1.9 oz (113 kg)     Other studies Reviewed: Additional studies/ records that were reviewed today include: CXR films  Assessment and Plan:  1.  Chronic systolic dysfunction S/p STJ CRTD implanted 12/2014.  LV threshold today 2.25V@0 . (P4-RVc).  Other vectors with significantly higher thresholds.  Will program LV autocapture on with 0.5V safety margin to improve battery longevity.  euvolemic today Stable on an appropriate medical regimen See [facility] report Repeat echo 10/16 at time of follow up with Dr Tenny Craw  2.  PVC's Burden on device interrogation today improved at 2.6% Continue Coreg  3.  HTN Recent hypotension, Sprinolactone decreased at last office visit 2 weeks ago Recheck BMET today  4.  Morbid Obesity Weight loss encouraged   Current medicines are reviewed at length with the patient today.   The patient does not have concerns regarding his  medicines.  The following changes were made today:  none  Labs/ tests ordered today include: BMET   Disposition:   Follow up with Dr Ladona Ridgel in July as scheduled.   Signed, Gypsy Balsam, NP 01/31/2015 8:33 AM  [facility] [address] [place] [address] [phone] (office) [phone] (fax)

## 2015-01-31 ENCOUNTER — Other Ambulatory Visit: Payer: Self-pay | Admitting: Internal Medicine

## 2015-01-31 ENCOUNTER — Encounter: Payer: Self-pay | Admitting: Nurse Practitioner

## 2015-01-31 ENCOUNTER — Ambulatory Visit (INDEPENDENT_AMBULATORY_CARE_PROVIDER_SITE_OTHER): Payer: BLUE CROSS/BLUE SHIELD | Admitting: Nurse Practitioner

## 2015-01-31 ENCOUNTER — Other Ambulatory Visit: Payer: Self-pay

## 2015-01-31 VITALS — BP 122/74 | HR 68 | Ht 70.0 in | Wt 303.0 lb

## 2015-01-31 DIAGNOSIS — I10 Essential (primary) hypertension: Secondary | ICD-10-CM | POA: Diagnosis not present

## 2015-01-31 DIAGNOSIS — I493 Ventricular premature depolarization: Secondary | ICD-10-CM | POA: Diagnosis not present

## 2015-01-31 DIAGNOSIS — I5022 Chronic systolic (congestive) heart failure: Secondary | ICD-10-CM | POA: Diagnosis not present

## 2015-01-31 LAB — BASIC METABOLIC PANEL
BUN: 16 mg/dL (ref 6–23)
CO2: 27 mEq/L (ref 19–32)
Calcium: 8.9 mg/dL (ref 8.4–10.5)
Chloride: 102 mEq/L (ref 96–112)
Creatinine, Ser: 1.22 mg/dL (ref 0.40–1.50)
GFR: 67.83 mL/min (ref >60.00)
Glucose, Bld: 160 mg/dL — ABNORMAL HIGH (ref 70–99)
Potassium: 4.1 mEq/L (ref 3.5–5.1)
Sodium: 134 mEq/L — ABNORMAL LOW (ref 135–145)

## 2015-01-31 LAB — CUP PACEART INCLINIC DEVICE CHECK
Brady Statistic RA Percent Paced: 12 %
Brady Statistic RV Percent Paced: 92 %
Date Time Interrogation Session: 20160509120604
HighPow Impedance: 54 Ohm
Lead Channel Impedance Value: 460 Ohm
Lead Channel Impedance Value: 680 Ohm
Lead Channel Impedance Value: 900 Ohm
Lead Channel Pacing Threshold Amplitude: 0.5 V
Lead Channel Pacing Threshold Amplitude: 0.75 V
Lead Channel Pacing Threshold Amplitude: 2 V
Lead Channel Pacing Threshold Pulse Width: 0.4 ms
Lead Channel Pacing Threshold Pulse Width: 0.4 ms
Lead Channel Pacing Threshold Pulse Width: 0.5 ms
Lead Channel Sensing Intrinsic Amplitude: 12 mV
Lead Channel Sensing Intrinsic Amplitude: 3.8 mV
Lead Channel Setting Pacing Amplitude: 3.5 V
Lead Channel Setting Pacing Amplitude: 3.5 V
Lead Channel Setting Pacing Amplitude: 3.5 V
Lead Channel Setting Pacing Pulse Width: 0.4 ms
Lead Channel Setting Pacing Pulse Width: 0.4 ms
Pulse Gen Serial Number: 7250204

## 2015-01-31 MED ORDER — LEVOTHYROXINE SODIUM 50 MCG PO TABS: 50.0000 ug | ORAL_TABLET | Freq: Every day | ORAL | Status: DC

## 2015-01-31 NOTE — Patient Instructions (Signed)
Medication Instructions: - no change  Labwork: - Your physician recommends that you have lab work today: BMP  Procedures/Testing: - none  Follow-Up: - as scheduled with Dr. Ladona Ridgel - Wednesday 04/06/15 at 2:00 pm  Any Additional Special Instructions Will Be Listed Below (If Applicable). - none

## 2015-02-06 ENCOUNTER — Other Ambulatory Visit: Payer: Self-pay | Admitting: Sports Medicine

## 2015-02-22 ENCOUNTER — Encounter: Payer: Self-pay | Admitting: Internal Medicine

## 2015-03-06 ENCOUNTER — Encounter: Payer: Self-pay | Admitting: Sports Medicine

## 2015-03-09 MED ORDER — AMLODIPINE BESYLATE 10 MG PO TABS: 10.0000 mg | ORAL_TABLET | Freq: Every day | ORAL | Status: DC

## 2015-03-09 MED ORDER — CARVEDILOL 25 MG PO TABS: 25.0000 mg | ORAL_TABLET | Freq: Two times a day (BID) | ORAL | Status: DC

## 2015-03-25 ENCOUNTER — Other Ambulatory Visit: Payer: Self-pay | Admitting: Internal Medicine

## 2015-03-28 ENCOUNTER — Encounter: Payer: Self-pay | Admitting: Sports Medicine

## 2015-03-28 DIAGNOSIS — E119 Type 2 diabetes mellitus without complications: Secondary | ICD-10-CM

## 2015-03-29 MED ORDER — CANAGLIFLOZIN 300 MG PO TABS: 300.0000 mg | ORAL_TABLET | Freq: Every day | ORAL | Status: DC

## 2015-03-29 MED ORDER — FUROSEMIDE 40 MG PO TABS: 40.0000 mg | ORAL_TABLET | ORAL | Status: DC

## 2015-04-06 ENCOUNTER — Encounter: Payer: BLUE CROSS/BLUE SHIELD | Admitting: Internal Medicine

## 2015-05-08 IMAGING — CR DG CHEST 2V
2 series · 2 of 2 positions shown · non-contrast
Comparison: None.

CLINICAL DATA: Status post AICD placement.

EXAM:
CHEST  2 VIEW

[chest pa]
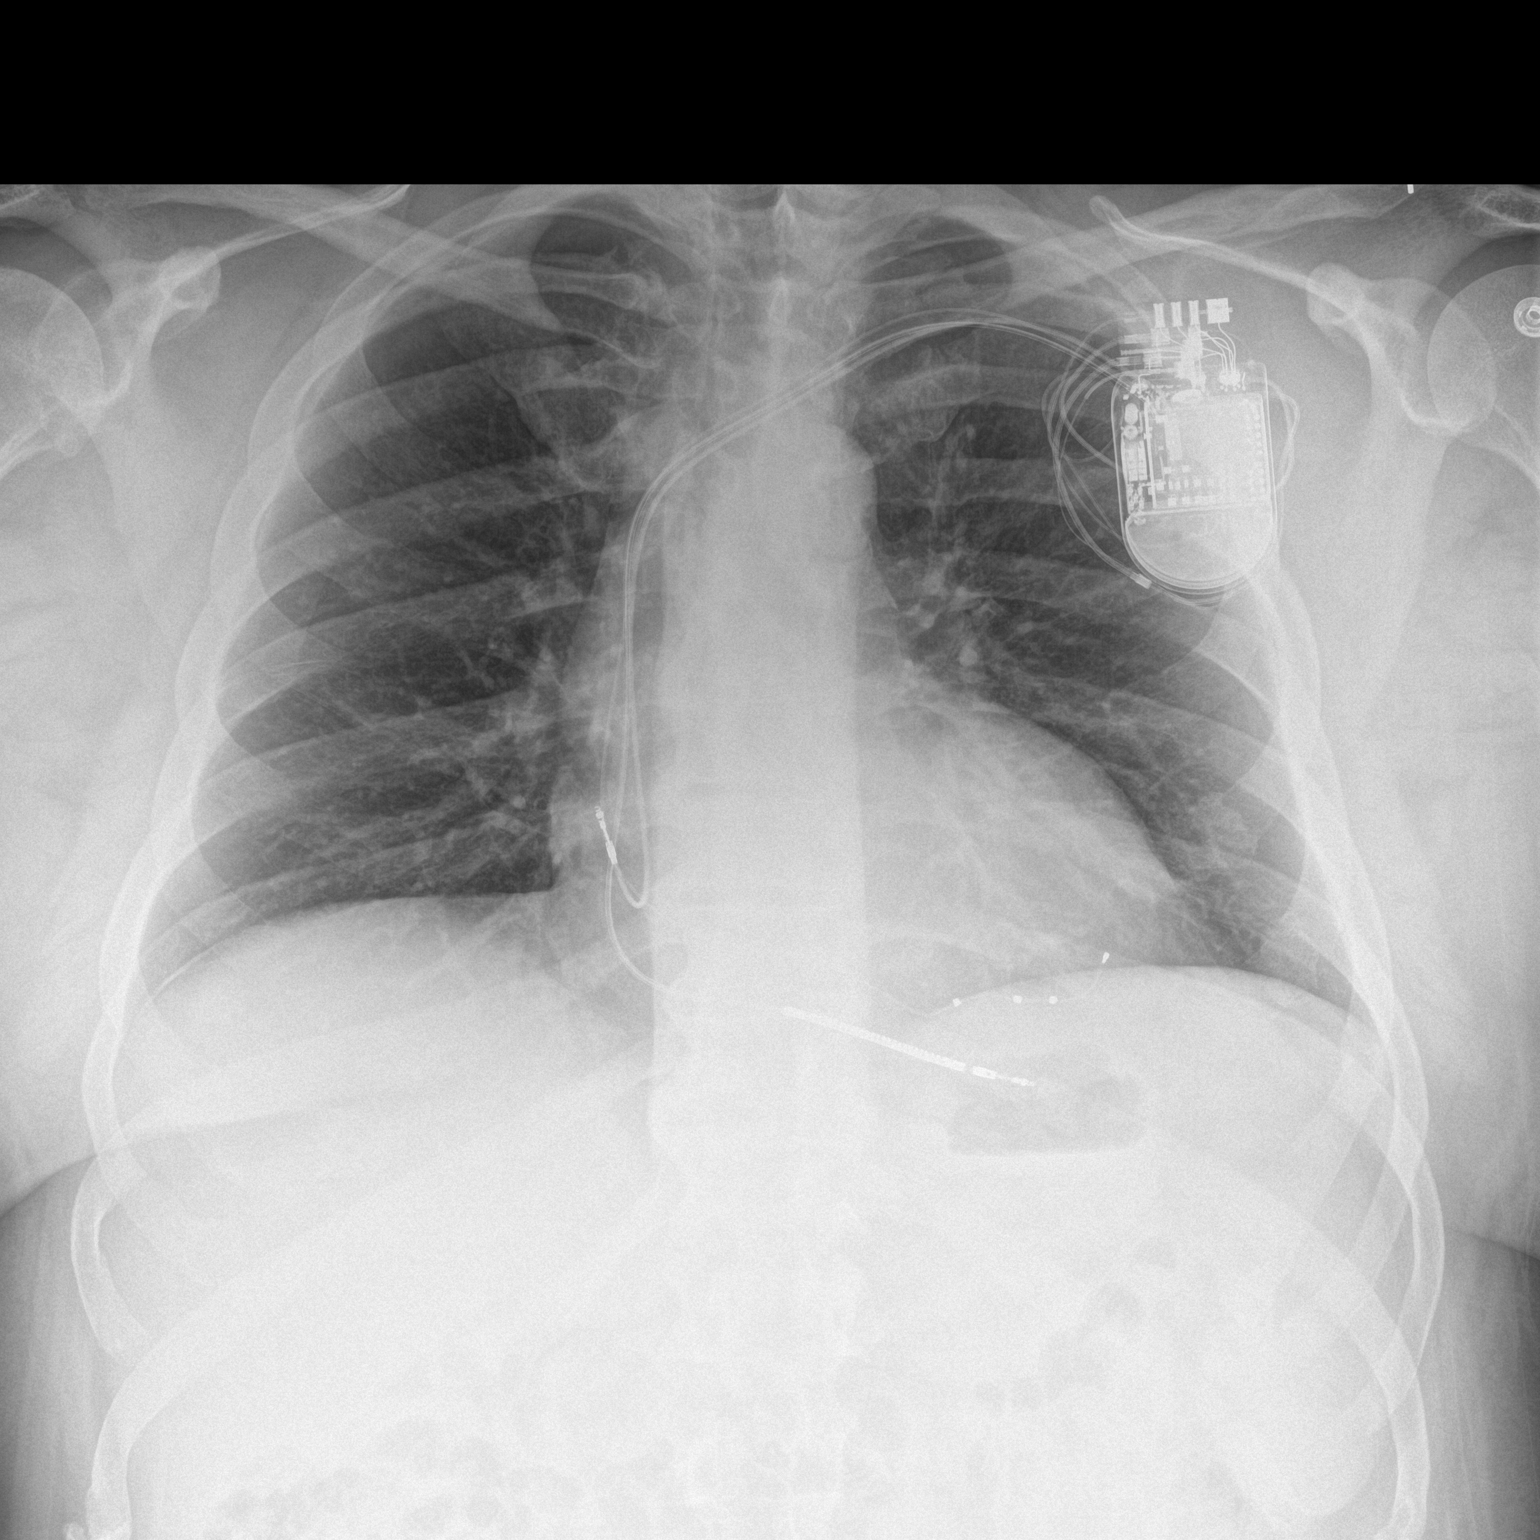

[chest lat]
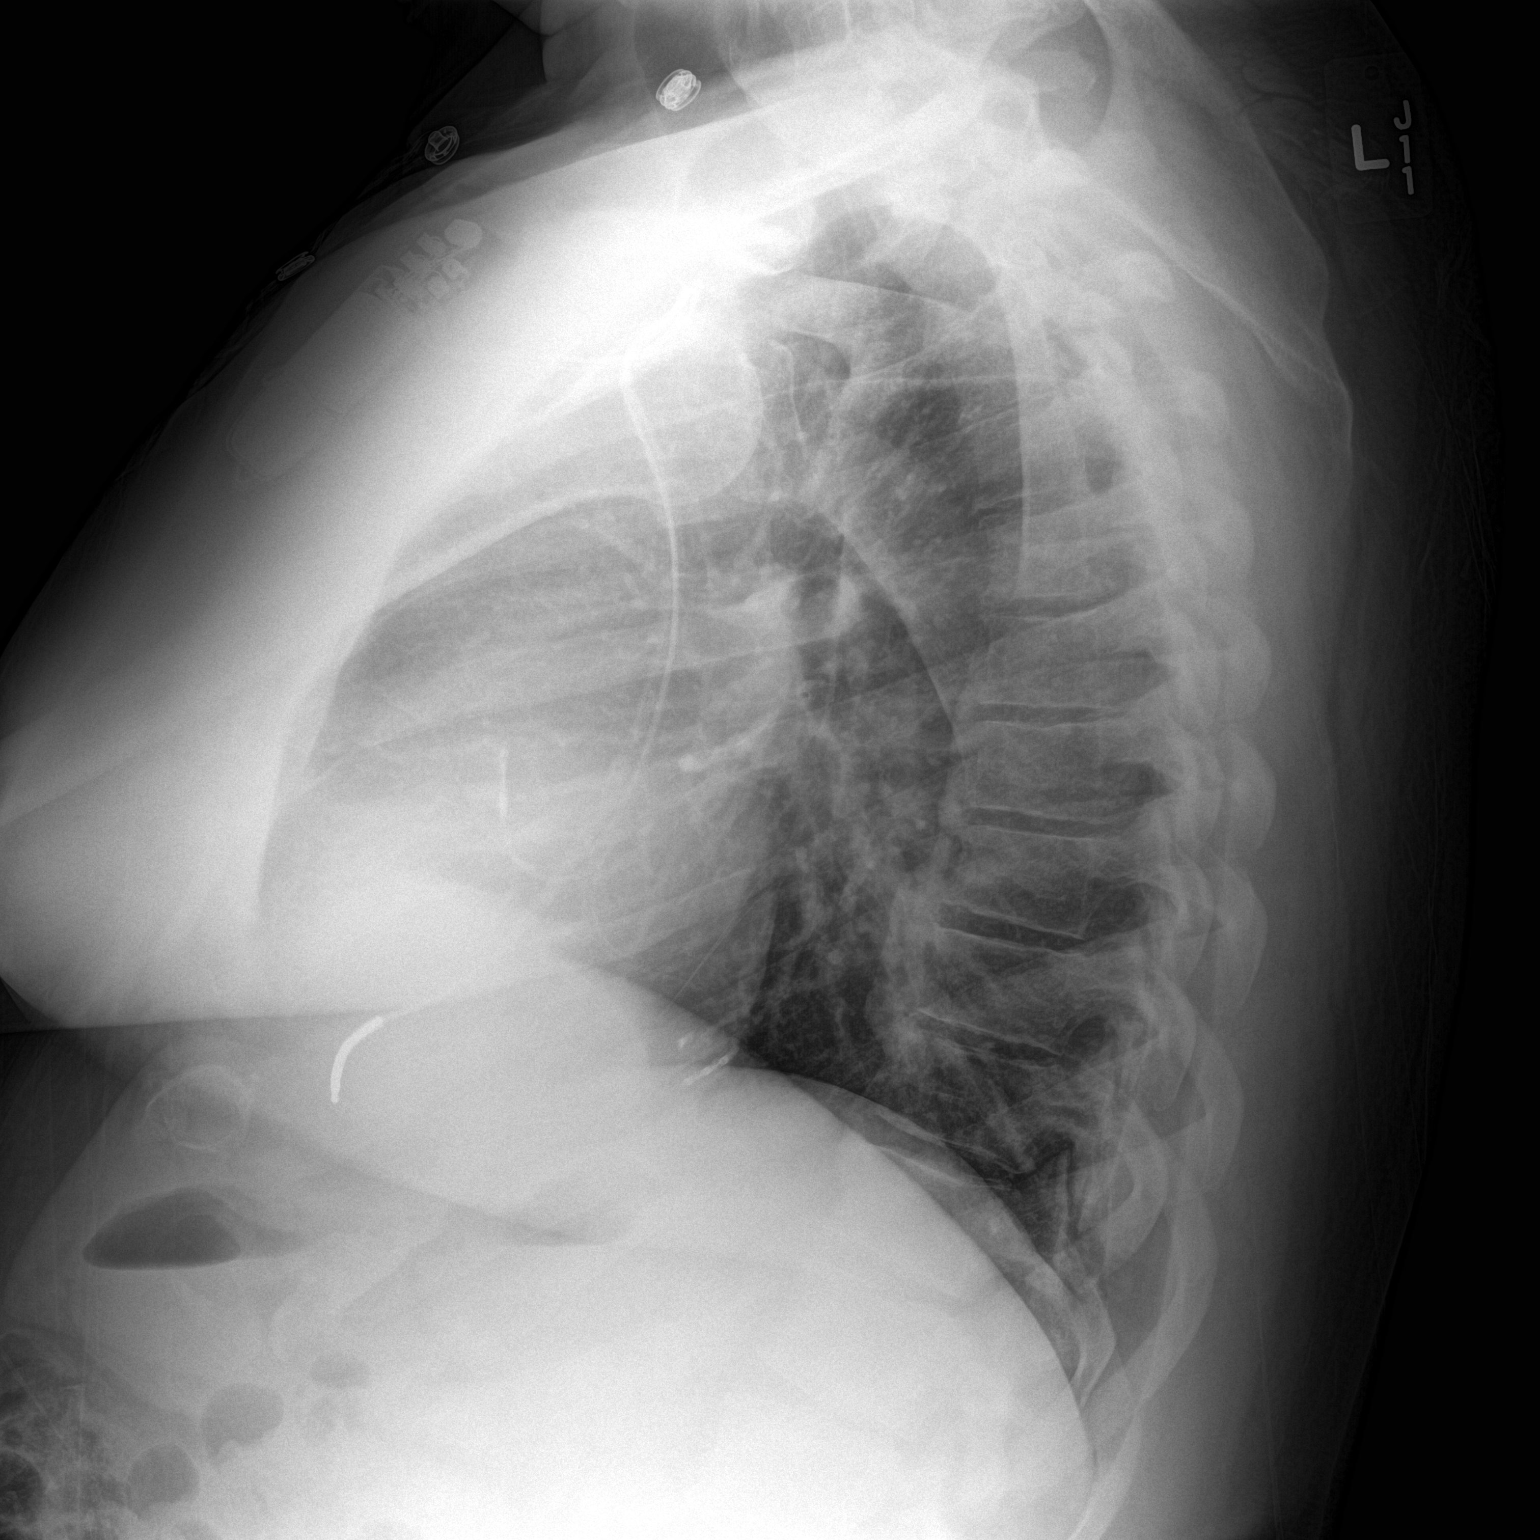

[2 of 2 positions shown; findings below may reference images not displayed]

FINDINGS: The lungs are adequately inflated and clear. The heart and
mediastinal structures are normal. The AICD is in appropriate
position radiographically. The bony structures are unremarkable.
IMPRESSION: There is no postprocedure complication following AICD placement.

## 2015-05-18 IMAGING — CR DG CHEST 2V
2 series · 2 of 2 positions shown · non-contrast
Comparison: PA and lateral chest x-ray December 31, 2014

CLINICAL DATA: Status post AICD placement on December 30, 2014; fatigue
with no other complaints

EXAM:
CHEST  2 VIEW

[view not recorded (1 of 2)]
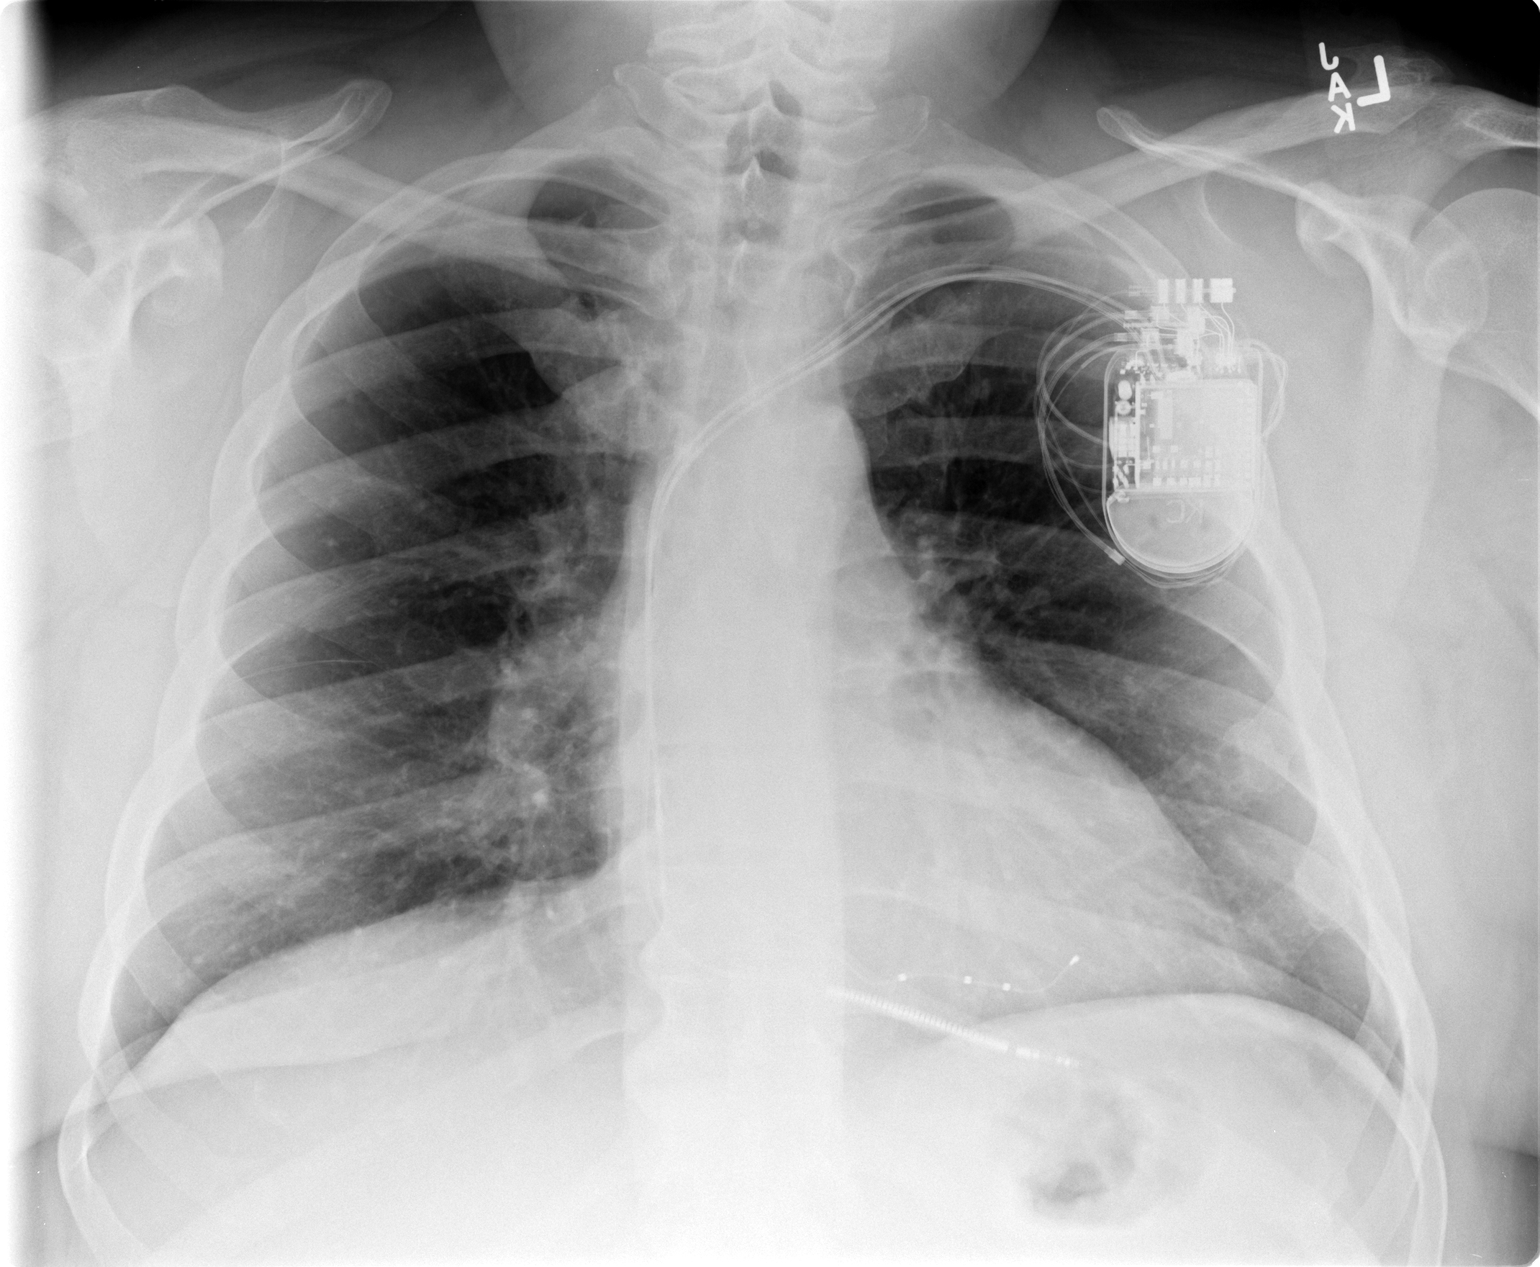

[view not recorded (2 of 2)]
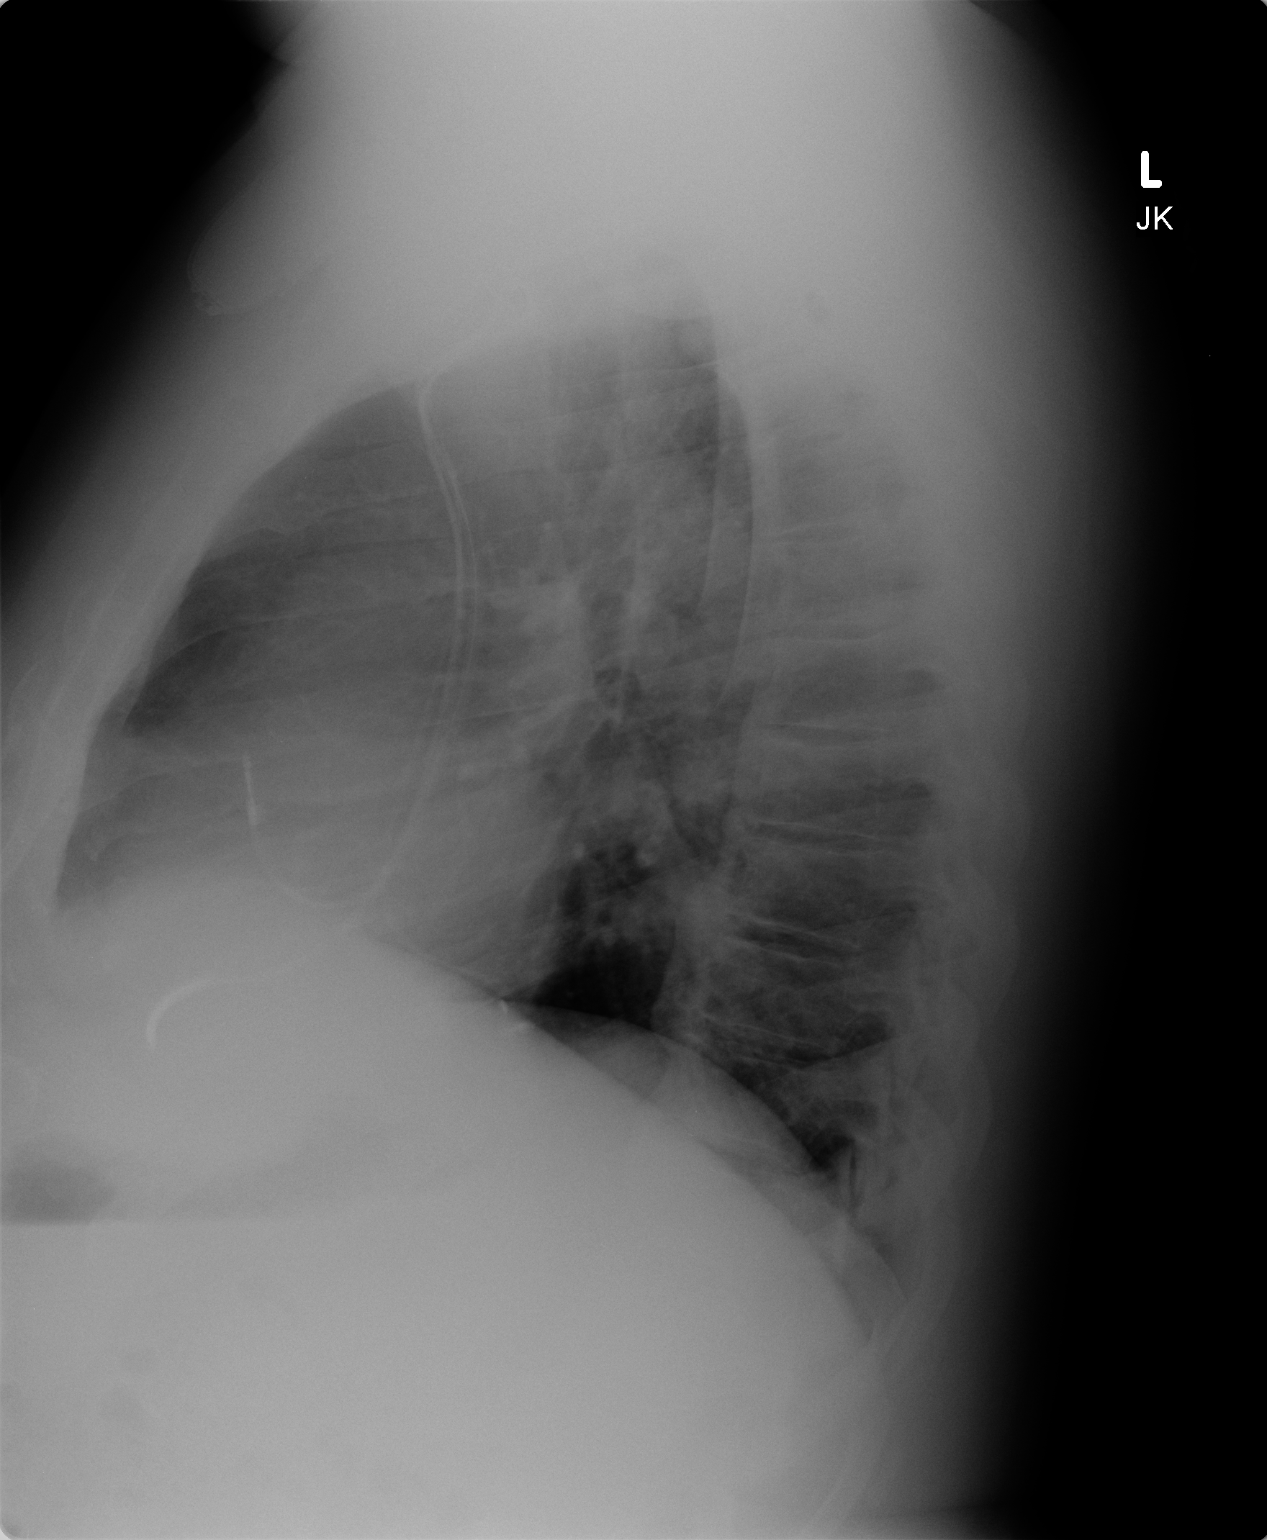

[2 of 2 positions shown; findings below may reference images not displayed]

FINDINGS: The lungs are adequately inflated and clear. The heart is top-normal
in size. The pulmonary vascularity is normal. The AICD is in
reasonable position radiographically. There is no pleural effusion.
There old anterior rib deformities on the left.
IMPRESSION: There is no active cardiopulmonary disease.

## 2015-06-06 ENCOUNTER — Ambulatory Visit (INDEPENDENT_AMBULATORY_CARE_PROVIDER_SITE_OTHER): Payer: BLUE CROSS/BLUE SHIELD | Admitting: Sports Medicine

## 2015-06-06 ENCOUNTER — Encounter: Payer: Self-pay | Admitting: Sports Medicine

## 2015-06-06 VITALS — BP 120/73 | HR 76 | Ht 70.0 in | Wt 286.0 lb

## 2015-06-06 DIAGNOSIS — I5022 Chronic systolic (congestive) heart failure: Secondary | ICD-10-CM

## 2015-06-06 DIAGNOSIS — N5201 Erectile dysfunction due to arterial insufficiency: Secondary | ICD-10-CM

## 2015-06-06 MED ORDER — MAGNESIUM OXIDE 400 MG PO TABS: 800.0000 mg | ORAL_TABLET | Freq: Every day | ORAL | Status: DC

## 2015-06-06 MED ORDER — TADALAFIL 5 MG PO TABS: 5.0000 mg | ORAL_TABLET | Freq: Every day | ORAL | Status: DC

## 2015-06-06 NOTE — Assessment & Plan Note (Signed)
Issues with both quality and duration. Libido is minimal. Adding Cialis, testosterone levels.

## 2015-06-06 NOTE — Progress Notes (Signed)
  Subjective:    CC: Follow-up  HPI: This is a pleasant 49 year old male with congestive heart failure, he is done extremely well recently, and has overall been fairly euvolemic without any orthopnea or PND, unfortunately he has been developing some significant cramping in the calf's and the groin at night, he is only on Lasix 40 mg twice a week and Aldactone 25 mg twice a day, his cardiologist recently decreased this to 12.5 mg twice a day. He has started to take an over-the-counter potassium supplement which has not been helpful, he is not taking any magnesium.  Erectile dysfunction: Present for a long time, sex drive is only minimal, he also has difficulty with both quality and maintenance of the erection. He took Cialis in the past with a fantastic response and would like to restart this.  Past medical history, Surgical history, Family history not pertinant except as noted below, Social history, Allergies, and medications have been entered into the medical record, reviewed, and no changes needed.   Review of Systems: No fevers, chills, night sweats, weight loss, chest pain, or shortness of breath.   Objective:    General: Well Developed, well nourished, and in no acute distress.  Neuro: Alert and oriented x3, extra-ocular muscles intact, sensation grossly intact.  HEENT: Normocephalic, atraumatic, pupils equal round reactive to light, neck supple, no masses, no lymphadenopathy, thyroid nonpalpable.  Skin: Warm and dry, no rashes. Cardiac: Regular rate and rhythm, no murmurs rubs or gallops,1+ bilateral symmetric lower stream edema with palpable dorsalis pedis and posterior tibial pulses. Negative Homans sign bilaterally. Respiratory: Clear to auscultation bilaterally. Not using accessory muscles, speaking in full sentences.  Impression and Recommendations:    I spent 25 minutes with this patient, greater than 50% was face-to-face time counseling regarding the above diagnoses

## 2015-06-06 NOTE — Assessment & Plan Note (Signed)
Denies any symptoms of orthopnea or paroxysmal nocturnal dyspnea, no chest pain. He is a bit hypervolemic today though his weight is significantly down, he did have a weekend of drinking beer watching the football games. He is decreased to one half of a Aldactone tab twice a day, and Lasix twice a week, he is getting some cramping. We are going to check a metabolic panel erectile dysfunction today, and it would like him to stop his potassium, and I'm going to switch to magnesium oxide at bedtime.

## 2015-06-07 LAB — BASIC METABOLIC PANEL
BUN: 12 mg/dL (ref 7–25)
CO2: 26 mmol/L (ref 20–31)
Calcium: 9.4 mg/dL (ref 8.6–10.3)
Chloride: 100 mmol/L (ref 98–110)
Creat: 1.16 mg/dL (ref 0.60–1.35)
Glucose, Bld: 158 mg/dL — ABNORMAL HIGH (ref 65–99)
Potassium: 5 mmol/L (ref 3.5–5.3)
Sodium: 137 mmol/L (ref 135–146)

## 2015-06-07 LAB — TESTOSTERONE, FREE, TOTAL, SHBG
Sex Hormone Binding: 38 nmol/L (ref 10–50)
Testosterone, Free: 95.1 pg/mL (ref 47.0–244.0)
Testosterone-% Free: 1.9 % (ref 1.6–2.9)
Testosterone: 495 ng/dL (ref 300–890)

## 2015-06-13 ENCOUNTER — Other Ambulatory Visit: Payer: Self-pay | Admitting: Sports Medicine

## 2015-06-13 MED ORDER — ALLOPURINOL 300 MG PO TABS: 300.0000 mg | ORAL_TABLET | Freq: Two times a day (BID) | ORAL | Status: DC

## 2015-06-30 ENCOUNTER — Ambulatory Visit: Payer: BLUE CROSS/BLUE SHIELD | Admitting: Sports Medicine

## 2015-07-06 ENCOUNTER — Encounter: Payer: Self-pay | Admitting: Sports Medicine

## 2015-07-07 MED ORDER — SILDENAFIL CITRATE 20 MG PO TABS: 20.0000 mg | ORAL_TABLET | ORAL | Status: DC | PRN

## 2015-07-29 ENCOUNTER — Encounter: Payer: Self-pay | Admitting: *Deleted

## 2015-08-24 ENCOUNTER — Other Ambulatory Visit: Payer: Self-pay | Admitting: Sports Medicine

## 2015-08-25 ENCOUNTER — Encounter: Payer: Self-pay | Admitting: Sports Medicine

## 2015-09-24 ENCOUNTER — Other Ambulatory Visit: Payer: Self-pay | Admitting: Sports Medicine

## 2015-11-05 ENCOUNTER — Other Ambulatory Visit: Payer: Self-pay | Admitting: Sports Medicine

## 2015-11-14 ENCOUNTER — Other Ambulatory Visit: Payer: Self-pay | Admitting: Sports Medicine

## 2015-12-05 ENCOUNTER — Encounter: Payer: Self-pay | Admitting: Sports Medicine

## 2015-12-05 ENCOUNTER — Ambulatory Visit (INDEPENDENT_AMBULATORY_CARE_PROVIDER_SITE_OTHER): Payer: BLUE CROSS/BLUE SHIELD | Admitting: Sports Medicine

## 2015-12-05 ENCOUNTER — Ambulatory Visit (INDEPENDENT_AMBULATORY_CARE_PROVIDER_SITE_OTHER): Payer: BLUE CROSS/BLUE SHIELD

## 2015-12-05 VITALS — BP 102/65 | HR 88 | Temp 97.9°F | Resp 18 | Wt 282.8 lb

## 2015-12-05 DIAGNOSIS — R0602 Shortness of breath: Secondary | ICD-10-CM

## 2015-12-05 MED ORDER — AZITHROMYCIN 250 MG PO TABS: ORAL_TABLET | ORAL | Status: DC

## 2015-12-05 MED ORDER — OSELTAMIVIR PHOSPHATE 75 MG PO CAPS: 75.0000 mg | ORAL_CAPSULE | Freq: Two times a day (BID) | ORAL | Status: DC

## 2015-12-05 NOTE — Assessment & Plan Note (Signed)
High risk in this individual with diabetes and heart failure.  Adding Tamiflu, azithromycin, chest x-ray, d-dimer, BNP.

## 2015-12-05 NOTE — Progress Notes (Signed)
  Subjective:    CC: Not feeling well  HPI: For the past 2 days this pleasant 50 year old male has had a cough, fatigue, sore throat, and overall malaise. No chest pain, no orthopnea, no PND, low-grade fevers. No lower extremity swelling. No nausea, vomiting, diarrhea, no skin rash  Past medical history, Surgical history, Family history not pertinant except as noted below, Social history, Allergies, and medications have been entered into the medical record, reviewed, and no changes needed.   Review of Systems: No fevers, chills, night sweats, weight loss, chest pain, or shortness of breath.   Objective:    General: Well Developed, well nourished, and in no acute distress.  Neuro: Alert and oriented x3, extra-ocular muscles intact, sensation grossly intact.  HEENT: Normocephalic, atraumatic, pupils equal round reactive to light, neck supple, no masses, no lymphadenopathy, thyroid nonpalpable. Oropharynx, nasopharynx, ear canals unremarkable. Skin: Warm and dry, no rashes. Cardiac: Regular rate and rhythm, no murmurs rubs or gallops, no lower extremity edema. Negative Homans sign bilaterally Respiratory: Clear to auscultation bilaterally. Not using accessory muscles, speaking in full sentences.  Chest x-ray personally reviewed, no obvious clear infiltrate  Impression and Recommendations:

## 2015-12-06 ENCOUNTER — Ambulatory Visit (INDEPENDENT_AMBULATORY_CARE_PROVIDER_SITE_OTHER): Payer: BLUE CROSS/BLUE SHIELD

## 2015-12-06 DIAGNOSIS — R0602 Shortness of breath: Secondary | ICD-10-CM | POA: Diagnosis not present

## 2015-12-06 LAB — CBC WITH DIFFERENTIAL/PLATELET
Basophils Absolute: 0 K/uL (ref 0.0–0.1)
Basophils Relative: 0 % (ref 0–1)
Eosinophils Absolute: 0.4 K/uL (ref 0.0–0.7)
Eosinophils Relative: 5 % (ref 0–5)
HCT: 49 % (ref 39.0–52.0)
Hemoglobin: 17.9 g/dL — ABNORMAL HIGH (ref 13.0–17.0)
Lymphocytes Relative: 22 % (ref 12–46)
Lymphs Abs: 1.8 K/uL (ref 0.7–4.0)
MCH: 32.5 pg (ref 26.0–34.0)
MCHC: 36.5 g/dL — ABNORMAL HIGH (ref 30.0–36.0)
MCV: 89.1 fL (ref 78.0–100.0)
MPV: 11.2 fL (ref 8.6–12.4)
Monocytes Absolute: 0.7 K/uL (ref 0.1–1.0)
Monocytes Relative: 9 % (ref 3–12)
Neutro Abs: 5.1 K/uL (ref 1.7–7.7)
Neutrophils Relative %: 64 % (ref 43–77)
Platelets: 202 K/uL (ref 150–400)
RBC: 5.5 MIL/uL (ref 4.22–5.81)
RDW: 13.7 % (ref 11.5–15.5)
WBC: 8 K/uL (ref 4.0–10.5)

## 2015-12-06 LAB — COMPREHENSIVE METABOLIC PANEL WITH GFR
ALT: 42 U/L (ref 9–46)
AST: 29 U/L (ref 10–40)
Albumin: 4.4 g/dL (ref 3.6–5.1)
Alkaline Phosphatase: 54 U/L (ref 40–115)
BUN: 22 mg/dL (ref 7–25)
CO2: 24 mmol/L (ref 20–31)
Calcium: 9.9 mg/dL (ref 8.6–10.3)
Chloride: 95 mmol/L — ABNORMAL LOW (ref 98–110)
Creat: 1.35 mg/dL (ref 0.60–1.35)
Glucose, Bld: 252 mg/dL — ABNORMAL HIGH (ref 65–99)
Potassium: 4.8 mmol/L (ref 3.5–5.3)
Sodium: 133 mmol/L — ABNORMAL LOW (ref 135–146)
Total Bilirubin: 1.1 mg/dL (ref 0.2–1.2)
Total Protein: 7.7 g/dL (ref 6.1–8.1)

## 2015-12-06 LAB — BRAIN NATRIURETIC PEPTIDE: Brain Natriuretic Peptide: 18.2 pg/mL

## 2015-12-06 LAB — D-DIMER, QUANTITATIVE: D-Dimer, Quant: 0.6 ug{FEU}/mL — ABNORMAL HIGH (ref 0.00–0.48)

## 2015-12-06 MED ORDER — IOHEXOL 350 MG/ML SOLN
100.0000 mL | Freq: Once | INTRAVENOUS | Status: AC | PRN
  Administered 2015-12-06: 100 mL via INTRAVENOUS

## 2015-12-06 NOTE — Addendum Note (Signed)
Addended by: Monica Becton on: 12/06/2015 09:17 AM   Modules accepted: Orders

## 2015-12-18 ENCOUNTER — Other Ambulatory Visit: Payer: Self-pay | Admitting: Sports Medicine

## 2015-12-19 ENCOUNTER — Ambulatory Visit: Payer: BLUE CROSS/BLUE SHIELD | Admitting: Sports Medicine

## 2016-01-15 ENCOUNTER — Other Ambulatory Visit: Payer: Self-pay | Admitting: Sports Medicine

## 2016-02-13 ENCOUNTER — Other Ambulatory Visit: Payer: Self-pay | Admitting: Sports Medicine

## 2016-02-26 ENCOUNTER — Other Ambulatory Visit: Payer: Self-pay | Admitting: Sports Medicine

## 2016-02-29 ENCOUNTER — Telehealth: Payer: Self-pay | Admitting: Sports Medicine

## 2016-02-29 NOTE — Telephone Encounter (Signed)
I called patient and left a message that pt needs to schedule a f/u appointment on meds

## 2016-03-24 ENCOUNTER — Other Ambulatory Visit: Payer: Self-pay | Admitting: Sports Medicine

## 2016-04-22 ENCOUNTER — Other Ambulatory Visit: Payer: Self-pay | Admitting: Sports Medicine

## 2016-05-03 ENCOUNTER — Other Ambulatory Visit: Payer: Self-pay | Admitting: Sports Medicine

## 2016-05-19 ENCOUNTER — Other Ambulatory Visit: Payer: Self-pay | Admitting: Sports Medicine

## 2016-06-12 ENCOUNTER — Encounter: Payer: Self-pay | Admitting: Sports Medicine

## 2016-06-15 ENCOUNTER — Ambulatory Visit (INDEPENDENT_AMBULATORY_CARE_PROVIDER_SITE_OTHER): Payer: BLUE CROSS/BLUE SHIELD | Admitting: Sports Medicine

## 2016-06-15 DIAGNOSIS — E118 Type 2 diabetes mellitus with unspecified complications: Secondary | ICD-10-CM | POA: Diagnosis not present

## 2016-06-15 DIAGNOSIS — I10 Essential (primary) hypertension: Secondary | ICD-10-CM

## 2016-06-15 DIAGNOSIS — N5201 Erectile dysfunction due to arterial insufficiency: Secondary | ICD-10-CM | POA: Diagnosis not present

## 2016-06-15 DIAGNOSIS — M109 Gout, unspecified: Secondary | ICD-10-CM | POA: Diagnosis not present

## 2016-06-15 DIAGNOSIS — Z202 Contact with and (suspected) exposure to infections with a predominantly sexual mode of transmission: Secondary | ICD-10-CM | POA: Insufficient documentation

## 2016-06-15 MED ORDER — AVANAFIL 200 MG PO TABS: 1.0000 | ORAL_TABLET | ORAL | 11 refills | Status: DC | PRN

## 2016-06-15 MED ORDER — METRONIDAZOLE 500 MG PO TABS: 2000.0000 mg | ORAL_TABLET | Freq: Once | ORAL | 0 refills | Status: AC

## 2016-06-15 MED ORDER — CANAGLIFLOZIN 300 MG PO TABS: 300.0000 mg | ORAL_TABLET | Freq: Every day | ORAL | 3 refills | Status: DC

## 2016-06-15 NOTE — Assessment & Plan Note (Signed)
Rechecking uric acid levels. 

## 2016-06-15 NOTE — Assessment & Plan Note (Signed)
Unfortunate exposure to Trichomonas. Treated presumptively with 2 g Flagyl 1. Full STD testing.

## 2016-06-15 NOTE — Assessment & Plan Note (Signed)
Cialis is too expensive, generic Viagra was not effective. Switching to [employer]

## 2016-06-15 NOTE — Progress Notes (Signed)
  Subjective:    CC: Multiple issues  HPI: Hypertension: Controlled.  Hyperlipidemia: Controlled, needs a recheck.  Diabetes mellitus type 2: Due for eye exam, foot exam, A1c. Needs a refill on Invokana.  Erectile dysfunction: Has failed many months of Cialis and Viagra. Wonders if anything else can be used.  STD exposure: Girlfriend was diagnosed with Trichomonas, would like to be tested and treated.  Past medical history:  Negative.  See flowsheet/record as well for more information.  Surgical history: Negative.  See flowsheet/record as well for more information.  Family history: Negative.  See flowsheet/record as well for more information.  Social history: Negative.  See flowsheet/record as well for more information.  Allergies, and medications have been entered into the medical record, reviewed, and no changes needed.   Review of Systems: No fevers, chills, night sweats, weight loss, chest pain, or shortness of breath.   Objective:    General: Well Developed, well nourished, and in no acute distress.  Neuro: Alert and oriented x3, extra-ocular muscles intact, sensation grossly intact.  HEENT: Normocephalic, atraumatic, pupils equal round reactive to light, neck supple, no masses, no lymphadenopathy, thyroid nonpalpable.  Skin: Warm and dry, no rashes. Cardiac: Regular rate and rhythm, no murmurs rubs or gallops, no lower extremity edema.  Respiratory: Clear to auscultation bilaterally. Not using accessory muscles, speaking in full sentences.  Impression and Recommendations:    Essential hypertension, benign Stable and well-controlled, no changes needed.  Diabetes mellitus, type 2 Normal foot exam, checking A1c, referral to ophthalmology.  Gout Rechecking uric acid levels.  Erectile dysfunction due to arterial insufficiency Cialis is too expensive, generic Viagra was not effective. Switching to [place]  STD exposure Unfortunate exposure to Trichomonas. Treated  presumptively with 2 g Flagyl 1. Full STD testing.  I spent 25 minutes with this patient, greater than 50% was face-to-face time counseling regarding the above diagnoses

## 2016-06-15 NOTE — Assessment & Plan Note (Signed)
Stable and well-controlled, no changes needed. 

## 2016-06-15 NOTE — Assessment & Plan Note (Addendum)
Normal foot exam, checking A1c, referral to ophthalmology.  Hemoglobin A1c is above 8, increasing glipizide to twice a day.

## 2016-06-18 LAB — LIPID PANEL
Cholesterol: 158 mg/dL (ref 125–200)
HDL: 47 mg/dL (ref >=40)
LDL Cholesterol: 90 mg/dL (ref <130)
Total CHOL/HDL Ratio: 3.4 Ratio (ref <=5.0)
Triglycerides: 104 mg/dL (ref <150)
VLDL: 21 mg/dL (ref <30)

## 2016-06-18 LAB — COMPREHENSIVE METABOLIC PANEL
ALT: 47 U/L — ABNORMAL HIGH (ref 9–46)
AST: 34 U/L (ref 10–40)
Albumin: 4.2 g/dL (ref 3.6–5.1)
Alkaline Phosphatase: 49 U/L (ref 40–115)
BUN: 17 mg/dL (ref 7–25)
CO2: 25 mmol/L (ref 20–31)
Calcium: 9.5 mg/dL (ref 8.6–10.3)
Chloride: 99 mmol/L (ref 98–110)
Creat: 1.1 mg/dL (ref 0.60–1.35)
Glucose, Bld: 304 mg/dL — ABNORMAL HIGH (ref 65–99)
Potassium: 4.3 mmol/L (ref 3.5–5.3)
Sodium: 134 mmol/L — ABNORMAL LOW (ref 135–146)
Total Bilirubin: 0.8 mg/dL (ref 0.2–1.2)
Total Protein: 7 g/dL (ref 6.1–8.1)

## 2016-06-18 LAB — CBC
HCT: 43.3 % (ref 38.5–50.0)
Hemoglobin: 15 g/dL (ref 13.2–17.1)
MCH: 30.7 pg (ref 27.0–33.0)
MCHC: 34.6 g/dL (ref 32.0–36.0)
MCV: 88.5 fL (ref 80.0–100.0)
MPV: 11.6 fL (ref 7.5–12.5)
Platelets: 169 10*3/uL (ref 140–400)
RBC: 4.89 MIL/uL (ref 4.20–5.80)
RDW: 13.9 % (ref 11.0–15.0)
WBC: 6.4 10*3/uL (ref 3.8–10.8)

## 2016-06-18 LAB — URIC ACID: Uric Acid, Serum: 6.1 mg/dL (ref 4.0–8.0)

## 2016-06-18 LAB — TSH: TSH: 2.28 mIU/L (ref 0.40–4.50)

## 2016-06-18 NOTE — Addendum Note (Signed)
Addended by: Monica Becton on: 06/18/2016 09:40 AM   Modules accepted: Orders

## 2016-06-19 LAB — GC/CHLAMYDIA PROBE AMP
CT Probe RNA: NOT DETECTED
GC Probe RNA: NOT DETECTED

## 2016-06-19 LAB — HEMOGLOBIN A1C
Hgb A1c MFr Bld: 8.2 % — ABNORMAL HIGH (ref <5.7)
Mean Plasma Glucose: 189 mg/dL

## 2016-06-19 LAB — HSV(HERPES SMPLX)ABS-I+II(IGG+IGM)-BLD
HSV 1 Glycoprotein G Ab, IgG: 26.1 Index — ABNORMAL HIGH (ref <0.90)
HSV 2 Glycoprotein G Ab, IgG: 4.15 Index — ABNORMAL HIGH (ref <0.90)
Herpes Simplex Vrs I&II-IgM Ab (EIA): 0.53 INDEX

## 2016-06-19 LAB — RPR

## 2016-06-19 LAB — HIV ANTIBODY (ROUTINE TESTING W REFLEX): HIV 1&2 Ab, 4th Generation: NONREACTIVE

## 2016-06-19 MED ORDER — GLIPIZIDE 10 MG PO TABS
10.0000 mg | ORAL_TABLET | Freq: Two times a day (BID) | ORAL | 3 refills | Status: DC
Start: 2016-06-19 — End: 2017-10-24

## 2016-06-19 NOTE — Addendum Note (Signed)
Addended by: Monica Becton on: 06/19/2016 11:58 AM   Modules accepted: Orders

## 2016-07-02 ENCOUNTER — Other Ambulatory Visit: Payer: Self-pay | Admitting: Sports Medicine

## 2016-07-09 LAB — HM DIABETES EYE EXAM

## 2016-07-10 ENCOUNTER — Encounter: Payer: Self-pay | Admitting: Sports Medicine

## 2016-07-30 ENCOUNTER — Other Ambulatory Visit: Payer: Self-pay | Admitting: Sports Medicine

## 2016-08-11 ENCOUNTER — Other Ambulatory Visit: Payer: Self-pay | Admitting: Sports Medicine

## 2016-08-13 ENCOUNTER — Encounter: Payer: Self-pay | Admitting: Sports Medicine

## 2016-08-19 ENCOUNTER — Other Ambulatory Visit: Payer: Self-pay | Admitting: Sports Medicine

## 2016-09-09 ENCOUNTER — Other Ambulatory Visit: Payer: Self-pay | Admitting: Sports Medicine

## 2016-09-14 ENCOUNTER — Ambulatory Visit: Payer: BLUE CROSS/BLUE SHIELD | Admitting: Sports Medicine

## 2016-10-09 ENCOUNTER — Other Ambulatory Visit: Payer: Self-pay | Admitting: Sports Medicine

## 2016-10-15 ENCOUNTER — Telehealth: Payer: Self-pay | Admitting: *Deleted

## 2016-10-15 NOTE — Telephone Encounter (Signed)
PA sent through covermymeds [facility]

## 2016-10-24 ENCOUNTER — Ambulatory Visit (INDEPENDENT_AMBULATORY_CARE_PROVIDER_SITE_OTHER): Payer: BLUE CROSS/BLUE SHIELD | Admitting: Sports Medicine

## 2016-10-24 ENCOUNTER — Ambulatory Visit (INDEPENDENT_AMBULATORY_CARE_PROVIDER_SITE_OTHER): Payer: BLUE CROSS/BLUE SHIELD

## 2016-10-24 ENCOUNTER — Encounter: Payer: Self-pay | Admitting: Sports Medicine

## 2016-10-24 DIAGNOSIS — M25512 Pain in left shoulder: Secondary | ICD-10-CM

## 2016-10-24 DIAGNOSIS — M958 Other specified acquired deformities of musculoskeletal system: Secondary | ICD-10-CM | POA: Diagnosis not present

## 2016-10-24 NOTE — Assessment & Plan Note (Signed)
Multifactorial shoulder pain referable to the rotator cuff in the joint. Subacromial and glenohumeral injections as above. X-rays. Formal physical therapy, return in one month.

## 2016-10-24 NOTE — Progress Notes (Signed)
  Subjective:    CC: Left shoulder pain  HPI: This is a pleasant 51 year old male with 8 months of left shoulder pain, localized over the deltoid, worse with overhead activities, reaching, and it keeps him from riding his motorcycle at high handlebars. Pain is moderate, persistent.  Past medical history:  Negative.  See flowsheet/record as well for more information.  Surgical history: Negative.  See flowsheet/record as well for more information.  Family history: Negative.  See flowsheet/record as well for more information.  Social history: Negative.  See flowsheet/record as well for more information.  Allergies, and medications have been entered into the medical record, reviewed, and no changes needed.   Review of Systems: No fevers, chills, night sweats, weight loss, chest pain, or shortness of breath.   Objective:    General: Well Developed, well nourished, and in no acute distress.  Neuro: Alert and oriented x3, extra-ocular muscles intact, sensation grossly intact.  HEENT: Normocephalic, atraumatic, pupils equal round reactive to light, neck supple, no masses, no lymphadenopathy, thyroid nonpalpable.  Skin: Warm and dry, no rashes. Cardiac: Regular rate and rhythm, no murmurs rubs or gallops, no lower extremity edema.  Respiratory: Clear to auscultation bilaterally. Not using accessory muscles, speaking in full sentences. Left Shoulder: Inspection reveals no abnormalities, atrophy or asymmetry. Palpation is normal with no tenderness over AC joint or bicipital groove. ROM is full in all planes. Rotator cuff strength normal throughout. Positive Neer and Hawkin's tests, empty can. Speeds and Yergason's tests normal. Positive Obrien's, negative crank, negative clunk, and good stability. Normal scapular function observed. No painful arc and no drop arm sign. No apprehension sign  Procedure: Real-time Ultrasound Guided Injection of left subacromial bursa Device: GE Logiq E  Verbal  informed consent obtained.  Time-out conducted.  Noted no overlying erythema, induration, or other signs of local infection.  Skin prepped in a sterile fashion.  Local anesthesia: Topical Ethyl chloride.  With sterile technique and under real time ultrasound guidance:  1 mL kenalog 40, 1 mL lidocaine, 1 mL Marcaine injected easily Completed without difficulty  Pain immediately resolved suggesting accurate placement of the medication.  Advised to call if fevers/chills, erythema, induration, drainage, or persistent bleeding.  Images permanently stored and available for review in the ultrasound unit.  Impression: Technically successful ultrasound guided injection.  Procedure: Real-time Ultrasound Guided Injection of left glenohumeral joint Device: GE Logiq E  Verbal informed consent obtained.  Time-out conducted.  Noted no overlying erythema, induration, or other signs of local infection.  Skin prepped in a sterile fashion.  Local anesthesia: Topical Ethyl chloride.  With sterile technique and under real time ultrasound guidance:  Using a 22-gauge spinal needle advanced into the joint, 1 mL Kenalog, 2 mL lidocaine, 2 mL Marcaine injected easily. Completed without difficulty  Pain immediately resolved suggesting accurate placement of the medication.  Advised to call if fevers/chills, erythema, induration, drainage, or persistent bleeding.  Images permanently stored and available for review in the ultrasound unit.  Impression: Technically successful ultrasound guided injection.  Impression and Recommendations:    Left shoulder pain Multifactorial shoulder pain referable to the rotator cuff in the joint. Subacromial and glenohumeral injections as above. X-rays. Formal physical therapy, return in one month.

## 2016-10-29 NOTE — Telephone Encounter (Signed)
Andre [name]  has been approved from 10/15/2016-09/23/2038.ledtr message on patients vm and on pharm vm

## 2016-11-03 ENCOUNTER — Other Ambulatory Visit: Payer: Self-pay | Admitting: Sports Medicine

## 2016-11-16 ENCOUNTER — Encounter: Payer: Self-pay | Admitting: Cardiology

## 2016-11-19 ENCOUNTER — Other Ambulatory Visit: Payer: Self-pay | Admitting: Sports Medicine

## 2016-11-21 ENCOUNTER — Ambulatory Visit: Payer: BLUE CROSS/BLUE SHIELD | Admitting: Sports Medicine

## 2016-11-28 ENCOUNTER — Other Ambulatory Visit: Payer: Self-pay | Admitting: Sports Medicine

## 2017-01-14 ENCOUNTER — Other Ambulatory Visit: Payer: Self-pay | Admitting: Sports Medicine

## 2017-01-26 ENCOUNTER — Other Ambulatory Visit: Payer: Self-pay | Admitting: Sports Medicine

## 2017-02-24 ENCOUNTER — Other Ambulatory Visit: Payer: Self-pay | Admitting: Sports Medicine

## 2017-03-07 ENCOUNTER — Other Ambulatory Visit: Payer: Self-pay | Admitting: Sports Medicine

## 2017-04-04 ENCOUNTER — Other Ambulatory Visit: Payer: Self-pay | Admitting: Sports Medicine

## 2017-04-22 ENCOUNTER — Other Ambulatory Visit: Payer: Self-pay | Admitting: Sports Medicine

## 2017-06-02 ENCOUNTER — Other Ambulatory Visit: Payer: Self-pay | Admitting: Sports Medicine

## 2017-06-10 ENCOUNTER — Other Ambulatory Visit: Payer: Self-pay | Admitting: Sports Medicine

## 2017-07-03 ENCOUNTER — Other Ambulatory Visit: Payer: Self-pay | Admitting: Sports Medicine

## 2017-07-03 DIAGNOSIS — E118 Type 2 diabetes mellitus with unspecified complications: Secondary | ICD-10-CM

## 2017-07-30 ENCOUNTER — Other Ambulatory Visit: Payer: Self-pay | Admitting: Sports Medicine

## 2017-08-14 ENCOUNTER — Encounter: Payer: Self-pay | Admitting: Sports Medicine

## 2017-08-14 ENCOUNTER — Ambulatory Visit: Payer: BLUE CROSS/BLUE SHIELD | Admitting: Sports Medicine

## 2017-08-14 VITALS — BP 138/81 | HR 86 | Ht 70.0 in | Wt 296.0 lb

## 2017-08-14 DIAGNOSIS — E119 Type 2 diabetes mellitus without complications: Secondary | ICD-10-CM

## 2017-08-14 DIAGNOSIS — E039 Hypothyroidism, unspecified: Secondary | ICD-10-CM

## 2017-08-14 DIAGNOSIS — E118 Type 2 diabetes mellitus with unspecified complications: Secondary | ICD-10-CM | POA: Diagnosis not present

## 2017-08-14 DIAGNOSIS — Z0000 Encounter for general adult medical examination without abnormal findings: Secondary | ICD-10-CM

## 2017-08-14 DIAGNOSIS — M1A9XX0 Chronic gout, unspecified, without tophus (tophi): Secondary | ICD-10-CM

## 2017-08-14 DIAGNOSIS — I10 Essential (primary) hypertension: Secondary | ICD-10-CM | POA: Diagnosis not present

## 2017-08-14 DIAGNOSIS — I5022 Chronic systolic (congestive) heart failure: Secondary | ICD-10-CM

## 2017-08-14 DIAGNOSIS — Z23 Encounter for immunization: Secondary | ICD-10-CM | POA: Diagnosis not present

## 2017-08-14 DIAGNOSIS — R040 Epistaxis: Secondary | ICD-10-CM | POA: Insufficient documentation

## 2017-08-14 DIAGNOSIS — E038 Other specified hypothyroidism: Secondary | ICD-10-CM

## 2017-08-14 LAB — T4, FREE: Free T4: 1.3 ng/dL (ref 0.8–1.8)

## 2017-08-14 LAB — CBC
HCT: 45.5 % (ref 38.5–50.0)
Hemoglobin: 15.6 g/dL (ref 13.2–17.1)
MCH: 29.8 pg (ref 27.0–33.0)
MCHC: 34.3 g/dL (ref 32.0–36.0)
MCV: 87 fL (ref 80.0–100.0)
MPV: 11.7 fL (ref 7.5–12.5)
Platelets: 236 10*3/uL (ref 140–400)
RBC: 5.23 10*6/uL (ref 4.20–5.80)
RDW: 13.1 % (ref 11.0–15.0)
WBC: 9.6 10*3/uL (ref 3.8–10.8)

## 2017-08-14 LAB — COMPREHENSIVE METABOLIC PANEL
AG Ratio: 1.5 (calc) (ref 1.0–2.5)
ALT: 47 U/L — ABNORMAL HIGH (ref 9–46)
AST: 26 U/L (ref 10–40)
Albumin: 4.4 g/dL (ref 3.6–5.1)
Alkaline phosphatase (APISO): 53 U/L (ref 40–115)
BUN/Creatinine Ratio: 12 (calc) (ref 6–22)
BUN: 17 mg/dL (ref 7–25)
CO2: 25 mmol/L (ref 20–32)
Calcium: 9.2 mg/dL (ref 8.6–10.3)
Chloride: 99 mmol/L (ref 98–110)
Creat: 1.39 mg/dL — ABNORMAL HIGH (ref 0.60–1.35)
Globulin: 2.9 g/dL (calc) (ref 1.9–3.7)
Glucose, Bld: 247 mg/dL — ABNORMAL HIGH (ref 65–139)
Potassium: 4.3 mmol/L (ref 3.5–5.3)
Sodium: 135 mmol/L (ref 135–146)
Total Bilirubin: 0.8 mg/dL (ref 0.2–1.2)
Total Protein: 7.3 g/dL (ref 6.1–8.1)

## 2017-08-14 LAB — URIC ACID: Uric Acid, Serum: 6.6 mg/dL (ref 4.0–8.0)

## 2017-08-14 LAB — T3, FREE: T3, Free: 3.1 pg/mL (ref 2.3–4.2)

## 2017-08-14 LAB — POCT GLYCOSYLATED HEMOGLOBIN (HGB A1C): Hemoglobin A1C: 7.1

## 2017-08-14 LAB — LIPID PANEL W/REFLEX DIRECT LDL
Cholesterol: 173 mg/dL (ref <200)
HDL: 39 mg/dL — ABNORMAL LOW (ref >40)
LDL Cholesterol (Calc): 86 mg/dL (calc)
Non-HDL Cholesterol (Calc): 134 mg/dL (calc) — ABNORMAL HIGH (ref <130)
Total CHOL/HDL Ratio: 4.4 (calc) (ref <5.0)
Triglycerides: 361 mg/dL — ABNORMAL HIGH (ref <150)

## 2017-08-14 LAB — APTT: aPTT: 31 s (ref 22–34)

## 2017-08-14 LAB — TSH: TSH: 2.04 mIU/L (ref 0.40–4.50)

## 2017-08-14 LAB — PROTIME-INR
INR: 1
Prothrombin Time: 10.3 s (ref 9.0–11.5)

## 2017-08-14 MED ORDER — ALLOPURINOL 300 MG PO TABS: 300.0000 mg | ORAL_TABLET | Freq: Two times a day (BID) | ORAL | 3 refills | Status: DC

## 2017-08-14 NOTE — Patient Instructions (Signed)
Nosebleed, Adult A nosebleed is when blood comes out of the nose. Nosebleeds are common. Usually, they are not a sign of a serious condition. Nosebleeds can happen if a small blood vessel in your nose starts to bleed or if the lining of your nose (mucous membrane) cracks. They are commonly caused by:  Allergies.  Colds.  Picking your nose.  Blowing your nose too hard.  An injury from sticking an object into your nose or getting hit in the nose.  Dry or cold air.  Less common causes of nosebleeds include:  Toxic fumes.  Something abnormal in the nose or in the air-filled spaces in the bones of the face (sinuses).  Growths in the nose, such as polyps.  Medicines or conditions that cause blood to clot slowly.  Certain illnesses or procedures that irritate or dry out the nasal passages.  Follow these instructions at home: When you have a nosebleed:  Sit down and tilt your head slightly forward.  Use a clean towel or tissue to pinch your nostrils under the bony part of your nose. After 10 minutes, let go of your nose and see if bleeding starts again. Do not release pressure before that time. If there is still bleeding, repeat the pinching and holding for 10 minutes until the bleeding stops.  Do not place tissues or gauze in the nose to stop bleeding.  Avoid lying down and avoid tilting your head backward. That may make blood collect in the throat and cause gagging or coughing.  Use a nasal spray decongestant to help with a nosebleed as told by your health care provider.  Do not use petroleum jelly or mineral oil in your nose. It can drip into your lungs. After a nosebleed:  Avoid blowing your nose or sniffing for a number of hours.  Avoid straining, lifting, or bending at the waist for several days. You may resume other normal activities as you are able.  Use saline spray or a humidifier as told by your health care provider.  Aspirinand blood thinners make bleeding more  likely. If you are prescribed these medicines and you suffer from nosebleeds: ? Ask your health care provider if you should stop taking the medicines or if you should adjust the dose. ? Do not stop taking medicines that your health care provider has recommended unless told by your health care provider.  If your nosebleed was caused by dry mucous membranes, use over-the-counter saline nasal spray or gel. This will keep the mucous membranes moist and allow them to heal. If you must use a lubricant: ? Choose one that is water-soluble. ? Use only as much as you need and use it only as often as needed. ? Do not lie down until several hours after you use it. Contact a health care provider if:  You have a fever.  You get nosebleeds often or more often than usual.  You bruise very easily.  You have a nosebleed from having something stuck in your nose.  You have bleeding in your mouth.  You vomit or cough up brown material.  You have a nosebleed after you start a new medicine. Get help right away if:  You have a nosebleed after a fall or a head injury.  Your nosebleed does not go away after 20 minutes.  You feel dizzy or weak.  You have unusual bleeding from other parts of your body.  You have unusual bruising on other parts of your body.  You become sweaty.    You vomit blood. This information is not intended to replace advice given to you by your health care provider. Make sure you discuss any questions you have with your health care provider. Document Released: 06/20/2005 Document Revised: 05/10/2016 Document Reviewed: 03/27/2016 Elsevier Interactive Patient Education  2018 [employer]  

## 2017-08-14 NOTE — Assessment & Plan Note (Signed)
Most likely due to the cold dry air, he will add Vaseline to the inner nares, checking coags.

## 2017-08-14 NOTE — Assessment & Plan Note (Signed)
Euvolemic today, asymptomatic. Updating echocardiogram.

## 2017-08-14 NOTE — Assessment & Plan Note (Signed)
Doing okay, no changes needed.

## 2017-08-14 NOTE — Progress Notes (Signed)
  Subjective:    CC: Recheck chronic diseases  HPI: Hypertension: Controlled.  Hyperlipidemia: Due for blood work.  Congestive heart failure: Class II, minimally symptomatic with heavy exertion, due for echocardiogram  Diabetes mellitus type 2: A1c well controlled  Preventive measures: Up-to-date on flu shot, needs Tdap.  Nosebleeds: Has had 3, they have all stopped spontaneously.  Past medical history:  Negative.  See flowsheet/record as well for more information.  Surgical history: Negative.  See flowsheet/record as well for more information.  Family history: Negative.  See flowsheet/record as well for more information.  Social history: Negative.  See flowsheet/record as well for more information.  Allergies, and medications have been entered into the medical record, reviewed, and no changes needed.   Review of Systems: No fevers, chills, night sweats, weight loss, chest pain, or shortness of breath.   Objective:    General: Well Developed, well nourished, and in no acute distress.  Neuro: Alert and oriented x3, extra-ocular muscles intact, sensation grossly intact. Cranial nerves II through XII are intact, motor, sensory, and coordinative functions are all intact. HEENT: Normocephalic, atraumatic, pupils equal round reactive to light, neck supple, no masses, no lymphadenopathy, thyroid nonpalpable. Oropharynx, nasopharynx, external ear canals are unremarkable. Skin: Warm and dry, no rashes noted.  Cardiac: Regular rate and rhythm, no murmurs rubs or gallops.  Respiratory: Clear to auscultation bilaterally. Not using accessory muscles, speaking in full sentences.  Abdominal: Soft, nontender, nondistended, positive bowel sounds, no masses, no organomegaly.  Musculoskeletal: Shoulder, elbow, wrist, hip, knee, ankle stable, and with full range of motion.  Impression and Recommendations:    Annual physical exam Routine blood work obtained. Preventive measures caught  up.   Chronic systolic heart failure Euvolemic today, asymptomatic. Updating echocardiogram.  Diabetes mellitus, type 2 Getting routine labs, A1c in the office.  Essential hypertension, benign Doing okay, no changes needed.  Gout Refilling allopurinol, rechecking uric acid  Subclinical hypothyroidism Rechecking TSH, T3, T4  Epistaxis Most likely due to the cold dry air, he will add Vaseline to the inner nares, checking coags.  ___________________________________________ Ihor Austin. Benjamin Stain, M.D., ABFM., CAQSM. Primary Care and Sports Medicine [facility]  Adjunct Instructor of Family Medicine  [employer]

## 2017-08-14 NOTE — Addendum Note (Signed)
Addended by: Pixie Casino on: 08/14/2017 02:12 PM   Modules accepted: Orders

## 2017-08-14 NOTE — Assessment & Plan Note (Signed)
Rechecking TSH, T3, T4 

## 2017-08-14 NOTE — Assessment & Plan Note (Signed)
Refilling allopurinol, rechecking uric acid

## 2017-08-14 NOTE — Assessment & Plan Note (Signed)
Routine blood work obtained. Preventive measures caught up.

## 2017-08-14 NOTE — Assessment & Plan Note (Signed)
Getting routine labs, A1c in the office.

## 2017-08-17 ENCOUNTER — Other Ambulatory Visit: Payer: Self-pay | Admitting: Sports Medicine

## 2017-08-19 ENCOUNTER — Encounter: Payer: Self-pay | Admitting: Sports Medicine

## 2017-08-22 ENCOUNTER — Other Ambulatory Visit (HOSPITAL_BASED_OUTPATIENT_CLINIC_OR_DEPARTMENT_OTHER): Payer: BLUE CROSS/BLUE SHIELD

## 2017-09-06 ENCOUNTER — Other Ambulatory Visit: Payer: Self-pay | Admitting: Sports Medicine

## 2017-09-18 ENCOUNTER — Other Ambulatory Visit: Payer: Self-pay | Admitting: Sports Medicine

## 2017-09-20 ENCOUNTER — Ambulatory Visit (HOSPITAL_BASED_OUTPATIENT_CLINIC_OR_DEPARTMENT_OTHER)
Admission: RE | Admit: 2017-09-20 | Discharge: 2017-09-20 | Disposition: A | Discharge status: Home / Self Care | Payer: BLUE CROSS/BLUE SHIELD | Source: Ambulatory Visit | Attending: Sports Medicine | Admitting: Sports Medicine

## 2017-09-20 DIAGNOSIS — I110 Hypertensive heart disease with heart failure: Secondary | ICD-10-CM | POA: Diagnosis not present

## 2017-09-20 DIAGNOSIS — I509 Heart failure, unspecified: Secondary | ICD-10-CM | POA: Diagnosis present

## 2017-09-20 DIAGNOSIS — I5022 Chronic systolic (congestive) heart failure: Secondary | ICD-10-CM | POA: Diagnosis not present

## 2017-09-20 DIAGNOSIS — E119 Type 2 diabetes mellitus without complications: Secondary | ICD-10-CM | POA: Diagnosis not present

## 2017-09-20 MED ORDER — PERFLUTREN LIPID MICROSPHERE
1.0000 mL | INTRAVENOUS | Status: AC | PRN
  Administered 2017-09-20: 6 mL via INTRAVENOUS
  Filled 2017-09-20: qty 10

## 2017-09-20 NOTE — Progress Notes (Signed)
Echocardiogram 2D Echocardiogram with contrast has been performed.  [name], Andre 09/20/2017, 11:21 AM

## 2017-09-25 NOTE — Progress Notes (Signed)
Good afternoon Andre [name],  Your echo shows chronic heart failure with reduced ejection fraction of 40-45%. This means the heart has reduced pumping ability.  Recommend keeping all of your follow-up appointments with Dr. Karie Schwalbe. Bonita Quin should continue your blood pressure medications and monitor BP at home to a goal of <=130/80. Additionally, monitor your weight at home daily. Report a weekly weight gain of 3-5 pounds to your doctor. Limit salt to 2000 mg per day. Avoid alcohol and tobacco products.   You will likely need to repeat this test in 1 year.  Best, Vinetta Bergamo

## 2017-10-01 ENCOUNTER — Other Ambulatory Visit (HOSPITAL_BASED_OUTPATIENT_CLINIC_OR_DEPARTMENT_OTHER): Payer: BLUE CROSS/BLUE SHIELD

## 2017-10-07 ENCOUNTER — Other Ambulatory Visit: Payer: Self-pay | Admitting: Sports Medicine

## 2017-10-22 ENCOUNTER — Other Ambulatory Visit: Payer: Self-pay | Admitting: Sports Medicine

## 2017-10-24 ENCOUNTER — Other Ambulatory Visit: Payer: Self-pay | Admitting: Sports Medicine

## 2017-10-24 DIAGNOSIS — E118 Type 2 diabetes mellitus with unspecified complications: Secondary | ICD-10-CM

## 2017-11-04 ENCOUNTER — Other Ambulatory Visit: Payer: Self-pay | Admitting: Sports Medicine

## 2017-12-30 ENCOUNTER — Other Ambulatory Visit: Payer: Self-pay | Admitting: Sports Medicine

## 2018-02-10 ENCOUNTER — Telehealth: Payer: Self-pay

## 2018-02-10 NOTE — Telephone Encounter (Signed)
Spoke with pt regarding apt informed him that the outputs on his device needed to be turned down to conserve battery life and also that one of the measurements on his lead was out of range and needs to be addressed and that he should keep the apt that is scheduled with Dr. Ladona Ridgel. Pt voiced understanding.

## 2018-02-10 NOTE — Telephone Encounter (Signed)
LVM for pt to make apt with Dr. Ladona Ridgel pt needs to seen d/t outputs still programmed at 3.5V and noise on atrial lead/ low p-waves.

## 2018-02-17 ENCOUNTER — Other Ambulatory Visit: Payer: Self-pay | Admitting: Sports Medicine

## 2018-02-19 ENCOUNTER — Ambulatory Visit: Payer: BLUE CROSS/BLUE SHIELD | Admitting: Internal Medicine

## 2018-02-19 ENCOUNTER — Encounter: Payer: Self-pay | Admitting: Internal Medicine

## 2018-02-19 VITALS — BP 122/74 | HR 66 | Ht 70.0 in | Wt 300.0 lb

## 2018-02-19 DIAGNOSIS — I428 Other cardiomyopathies: Secondary | ICD-10-CM

## 2018-02-19 DIAGNOSIS — I5022 Chronic systolic (congestive) heart failure: Secondary | ICD-10-CM

## 2018-02-19 DIAGNOSIS — Z95810 Presence of automatic (implantable) cardiac defibrillator: Secondary | ICD-10-CM

## 2018-02-19 NOTE — Patient Instructions (Signed)
Medication Instructions:  Your physician recommends that you continue on your current medications as directed. Please refer to the Current Medication list given to you today.  Labwork: None ordered.  Testing/Procedures: None ordered.  Follow-Up: Your physician wants you to follow-up in: one year with Dr. Ladona Ridgel.   You will receive a reminder letter in the mail two months in advance. If you don't receive a letter, please call our office to schedule the follow-up appointment.  Remote monitoring is used to monitor your ICD from home. This monitoring reduces the number of office visits required to check your device to one time per year. It allows [place] to keep an eye on the functioning of your device to ensure it is working properly. You are scheduled for a device check from home on 05/21/2018. You may send your transmission at any time that day. If you have a wireless device, the transmission will be sent automatically. After your physician reviews your transmission, you will receive a postcard with your next transmission date.  Any Other Special Instructions Will Be Listed Below (If Applicable).  If you need a refill on your cardiac medications before your next appointment, please call your pharmacy.

## 2018-02-19 NOTE — Progress Notes (Signed)
HPI Andre [name] returns today after a 3 year absence from our EP clinic. He is a pleasant 52 yo man with a h/o chronic systolic heart failure, LBBB, s/p biv ICD. He has had a 3 year absence from our clinic. In the interim, he has been stable with no chest pain or sob. No syncope. No ICD shocks. He had some noise on both atrial and ventricular leads last week while he was a [employer]. He has occaisional episodes of edema and he admits to some dietary indiscretion.  Allergies  Allergen Reactions  . Metformin Nausea And Vomiting     Current Outpatient Medications  Medication Sig Dispense Refill  . allopurinol (ZYLOPRIM) 300 MG tablet Take 1 tablet (300 mg total) by mouth 2 (two) times daily. 180 tablet 3  . allopurinol (ZYLOPRIM) 300 MG tablet TAKE 1 TABLET BY MOUTH TWICE DAILY NEEDS  APPOINTMENT 60 tablet 0  . amLODipine (NORVASC) 10 MG tablet TAKE 1 TABLET BY MOUTH ONCE DAILY 90 tablet 1  . aspirin EC 81 MG tablet Take 81 mg by mouth daily.    . carvedilol (COREG) 25 MG tablet TAKE ONE TABLET BY MOUTH TWICE DAILY 180 tablet 3  . furosemide (LASIX) 40 MG tablet TAKE ONE TABLET BY MOUTH TWICE A WEEK ON MONDAYS AND THURSDAYS 24 tablet 14  . glipiZIDE (GLUCOTROL) 10 MG tablet TAKE ONE TABLET BY MOUTH TWICE DAILY BEFORE  A  MEAL 180 tablet 3  . INVOKANA 300 MG TABS tablet TAKE ONE TABLET BY MOUTH ONCE DAILY BEFORE  BREAKFAST 90 tablet 3  . levothyroxine (SYNTHROID, LEVOTHROID) 50 MCG tablet TAKE 1 TABLET BY MOUTH ONCE DAILY BEFORE BREAKFAST 90 tablet 0  . lisinopril (PRINIVIL,ZESTRIL) 2.5 MG tablet TAKE 1 TABLET BY MOUTH ONCE DAILY 90 tablet 0  . Potassium 99 MG TABS Take 99 mg by mouth once.     . sildenafil (REVATIO) 20 MG tablet TAKE 2-5 TABLETS BY MOUTH AS NEEDED FOR SEXUAL ACTIVITY 50 tablet 11  . spironolactone (ALDACTONE) 25 MG tablet TAKE 1 TABLET BY MOUTH TWICE DAILY 180 tablet 0   No current facility-administered medications for this visit.      Past Medical History:    Diagnosis Date  . CHF (congestive heart failure) (HCC)   . Diabetes mellitus without complication (HCC)   . Gout   . Hypertension   . LBBB (left bundle branch block)   . Non-ischemic cardiomyopathy (HCC)    a. s/p STJ CRTD  . PVC (premature ventricular contraction)     ROS:   All systems reviewed and negative except as noted in the HPI.   Past Surgical History:  Procedure Laterality Date  . HIP SURGERY    . IMPLANTABLE CARDIOVERTER DEFIBRILLATOR IMPLANT N/A 12/30/2014   STJ CRTD implanted by Dr Ladona Ridgel  . WRIST SURGERY       Family History  Problem Relation Age of Onset  . Heart attack Father   . Hypertension Father   . Depression Sister   . Alcohol abuse Maternal Grandmother   . Alcohol abuse Maternal Grandfather      Social History   Socioeconomic History  . Marital status: Single    Spouse name: Not on file  . Number of children: Not on file  . Years of education: Not on file  . Highest education level: Not on file  Occupational History  . Not on file  Social Needs  . Financial resource strain: Not on file  . Food  insecurity:    Worry: Not on file    Inability: Not on file  . Transportation needs:    Medical: Not on file    Non-medical: Not on file  Tobacco Use  . Smoking status: Never Smoker  . Smokeless tobacco: Never Used  Substance and Sexual Activity  . Alcohol use: Yes    Comment: social  . Drug use: No  . Sexual activity: Not on file  Lifestyle  . Physical activity:    Days per week: Not on file    Minutes per session: Not on file  . Stress: Not on file  Relationships  . Social connections:    Talks on phone: Not on file    Gets together: Not on file    Attends religious service: Not on file    Active member of club or organization: Not on file    Attends meetings of clubs or organizations: Not on file    Relationship status: Not on file  . Intimate partner violence:    Fear of current or ex partner: Not on file    Emotionally  abused: Not on file    Physically abused: Not on file    Forced sexual activity: Not on file  Other Topics Concern  . Not on file  Social History Narrative  . Not on file     BP 122/74   Pulse 66   Ht 5\' 10"  (1.778 m)   Wt 300 lb (136.1 kg)   SpO2 96%   BMI 43.05 kg/m   Physical Exam:  Well appearing middle aged man, NAD HEENT: Unremarkable Neck:  6 cm JVD, no thyromegally Lymphatics:  No adenopathy Back:  No CVA tenderness Lungs:  Clear with no wheezes HEART:  Regular rate rhythm, no murmurs, no rubs, no clicks Abd:  soft, positive bowel sounds, no organomegally, no rebound, no guarding Ext:  2 plus pulses, no edema, no cyanosis, no clubbing Skin:  No rashes no nodules Neuro:  CN II through XII intact, motor grossly intact  EKG - NSR with biv pacing  DEVICE  Normal device function.  See PaceArt for details.   Assess/Plan: 1. BiV ICD - his device is working but LV threshold is increased. We have reprogrammed his device today to maximize his battery longevity 2. Chronic systolic heart failure - his symptoms are class 2. He will continue his current meds. He is encouraged to avoid salty foods.  3. Obesity - he is encouraged to lose weight. 4. HTN - his blood pressure is well controlled. No change in meds.  Leonia Reeves.D.

## 2018-02-25 LAB — CUP PACEART INCLINIC DEVICE CHECK
Battery Remaining Longevity: 40 mo
Brady Statistic RA Percent Paced: 13 %
Brady Statistic RV Percent Paced: 98 %
Date Time Interrogation Session: 20190529125000
HighPow Impedance: 87.75 Ohm
Implantable Lead Implant Date: 20160407
Implantable Lead Implant Date: 20160407
Implantable Lead Implant Date: 20160407
Implantable Lead Location: 753858
Implantable Lead Location: 753859
Implantable Lead Location: 753860
Implantable Lead Model: 7122
Implantable Pulse Generator Implant Date: 20160407
Lead Channel Impedance Value: 462.5 Ohm
Lead Channel Impedance Value: 487.5 Ohm
Lead Channel Impedance Value: 700 Ohm
Lead Channel Pacing Threshold Amplitude: 0.5 V
Lead Channel Pacing Threshold Amplitude: 0.5 V
Lead Channel Pacing Threshold Amplitude: 1.5 V
Lead Channel Pacing Threshold Pulse Width: 0.4 ms
Lead Channel Pacing Threshold Pulse Width: 0.4 ms
Lead Channel Pacing Threshold Pulse Width: 0.5 ms
Lead Channel Sensing Intrinsic Amplitude: 0.8 mV
Lead Channel Sensing Intrinsic Amplitude: 12 mV
Lead Channel Setting Pacing Amplitude: 2 V
Lead Channel Setting Pacing Amplitude: 2.5 V
Lead Channel Setting Pacing Amplitude: 2.5 V
Lead Channel Setting Pacing Pulse Width: 0.4 ms
Lead Channel Setting Pacing Pulse Width: 0.5 ms
Lead Channel Setting Sensing Sensitivity: 0.5 mV
Pulse Gen Serial Number: 7250204

## 2018-03-16 ENCOUNTER — Other Ambulatory Visit: Payer: Self-pay | Admitting: Sports Medicine

## 2018-04-06 ENCOUNTER — Other Ambulatory Visit: Payer: Self-pay | Admitting: Sports Medicine

## 2018-05-06 ENCOUNTER — Other Ambulatory Visit: Payer: Self-pay | Admitting: Sports Medicine

## 2018-05-21 ENCOUNTER — Ambulatory Visit (INDEPENDENT_AMBULATORY_CARE_PROVIDER_SITE_OTHER): Payer: BLUE CROSS/BLUE SHIELD | Admitting: *Deleted

## 2018-05-21 DIAGNOSIS — I428 Other cardiomyopathies: Secondary | ICD-10-CM | POA: Diagnosis not present

## 2018-05-21 DIAGNOSIS — I5022 Chronic systolic (congestive) heart failure: Secondary | ICD-10-CM

## 2018-05-21 NOTE — Progress Notes (Signed)
Remote ICD transmission.   

## 2018-05-22 ENCOUNTER — Encounter: Payer: Self-pay | Admitting: Cardiology

## 2018-05-26 ENCOUNTER — Other Ambulatory Visit: Payer: Self-pay | Admitting: Sports Medicine

## 2018-06-17 ENCOUNTER — Other Ambulatory Visit: Payer: Self-pay | Admitting: Sports Medicine

## 2018-06-17 NOTE — Telephone Encounter (Signed)
Must make appointment 

## 2018-06-23 LAB — CUP PACEART REMOTE DEVICE CHECK
Battery Remaining Longevity: 37 mo
Battery Remaining Percentage: 45 %
Battery Voltage: 2.96 V
Brady Statistic AP VP Percent: 18 %
Brady Statistic AP VS Percent: 1 %
Brady Statistic AS VP Percent: 82 %
Brady Statistic AS VS Percent: 1 %
Brady Statistic RA Percent Paced: 18 %
Date Time Interrogation Session: 20190828060025
HighPow Impedance: 89 Ohm
HighPow Impedance: 89 Ohm
Implantable Lead Implant Date: 20160407
Implantable Lead Implant Date: 20160407
Implantable Lead Implant Date: 20160407
Implantable Lead Location: 753858
Implantable Lead Location: 753859
Implantable Lead Location: 753860
Implantable Lead Model: 7122
Implantable Pulse Generator Implant Date: 20160407
Lead Channel Impedance Value: 450 Ohm
Lead Channel Impedance Value: 480 Ohm
Lead Channel Impedance Value: 690 Ohm
Lead Channel Pacing Threshold Amplitude: 0.5 V
Lead Channel Pacing Threshold Amplitude: 0.5 V
Lead Channel Pacing Threshold Amplitude: 2 V
Lead Channel Pacing Threshold Pulse Width: 0.4 ms
Lead Channel Pacing Threshold Pulse Width: 0.4 ms
Lead Channel Pacing Threshold Pulse Width: 0.5 ms
Lead Channel Sensing Intrinsic Amplitude: 1.4 mV
Lead Channel Sensing Intrinsic Amplitude: 12 mV
Lead Channel Setting Pacing Amplitude: 2 V
Lead Channel Setting Pacing Amplitude: 2.5 V
Lead Channel Setting Pacing Amplitude: 2.5 V
Lead Channel Setting Pacing Pulse Width: 0.4 ms
Lead Channel Setting Pacing Pulse Width: 0.5 ms
Lead Channel Setting Sensing Sensitivity: 0.5 mV
Pulse Gen Serial Number: 7250204

## 2018-07-17 ENCOUNTER — Other Ambulatory Visit: Payer: Self-pay | Admitting: Sports Medicine

## 2018-07-17 DIAGNOSIS — E118 Type 2 diabetes mellitus with unspecified complications: Secondary | ICD-10-CM

## 2018-07-23 ENCOUNTER — Other Ambulatory Visit: Payer: Self-pay | Admitting: Sports Medicine

## 2018-07-24 MED ORDER — CARVEDILOL 25 MG PO TABS: 25.0000 mg | ORAL_TABLET | Freq: Two times a day (BID) | ORAL | 0 refills | Status: DC

## 2018-07-28 ENCOUNTER — Ambulatory Visit: Payer: BLUE CROSS/BLUE SHIELD | Admitting: Sports Medicine

## 2018-07-28 VITALS — BP 117/78 | HR 79 | Ht 70.0 in | Wt 299.0 lb

## 2018-07-28 DIAGNOSIS — M25512 Pain in left shoulder: Secondary | ICD-10-CM

## 2018-07-28 DIAGNOSIS — I10 Essential (primary) hypertension: Secondary | ICD-10-CM

## 2018-07-28 DIAGNOSIS — M1A9XX0 Chronic gout, unspecified, without tophus (tophi): Secondary | ICD-10-CM

## 2018-07-28 DIAGNOSIS — Z0000 Encounter for general adult medical examination without abnormal findings: Secondary | ICD-10-CM

## 2018-07-28 DIAGNOSIS — I5022 Chronic systolic (congestive) heart failure: Secondary | ICD-10-CM | POA: Diagnosis not present

## 2018-07-28 DIAGNOSIS — E119 Type 2 diabetes mellitus without complications: Secondary | ICD-10-CM | POA: Diagnosis not present

## 2018-07-28 NOTE — Progress Notes (Signed)
Subjective:    CC: Follow-up multiple medical issues.  HPI: Hypertension: Controlled.  Systolic heart failure: Stable.  Diabetes mellitus type 2: No changes in medications.  Left shoulder pain: We did a subacromial and glenohumeral injection 2 years ago, he responded extremely well.  Only has a bit of discomfort over his deltoid with overhead activities  I reviewed the past medical history, family history, social history, surgical history, and allergies today and no changes were needed.  Please see the problem list section below in epic for further details.  Past Medical History: Past Medical History:  Diagnosis Date  . CHF (congestive heart failure) (HCC)   . Diabetes mellitus without complication (HCC)   . Gout   . Hypertension   . LBBB (left bundle branch block)   . Non-ischemic cardiomyopathy (HCC)    a. s/p STJ CRTD  . PVC (premature ventricular contraction)    Past Surgical History: Past Surgical History:  Procedure Laterality Date  . HIP SURGERY    . IMPLANTABLE CARDIOVERTER DEFIBRILLATOR IMPLANT N/A 12/30/2014   STJ CRTD implanted by Dr Ladona Ridgel  . WRIST SURGERY     Social History: Social History   Socioeconomic History  . Marital status: Single    Spouse name: Not on file  . Number of children: Not on file  . Years of education: Not on file  . Highest education level: Not on file  Occupational History  . Not on file  Social Needs  . Financial resource strain: Not on file  . Food insecurity:    Worry: Not on file    Inability: Not on file  . Transportation needs:    Medical: Not on file    Non-medical: Not on file  Tobacco Use  . Smoking status: Never Smoker  . Smokeless tobacco: Never Used  Substance and Sexual Activity  . Alcohol use: Yes    Comment: social  . Drug use: No  . Sexual activity: Not on file  Lifestyle  . Physical activity:    Days per week: Not on file    Minutes per session: Not on file  . Stress: Not on file  Relationships  .  Social connections:    Talks on phone: Not on file    Gets together: Not on file    Attends religious service: Not on file    Active member of club or organization: Not on file    Attends meetings of clubs or organizations: Not on file    Relationship status: Not on file  Other Topics Concern  . Not on file  Social History Narrative  . Not on file   Family History: Family History  Problem Relation Age of Onset  . Heart attack Father   . Hypertension Father   . Depression Sister   . Alcohol abuse Maternal Grandmother   . Alcohol abuse Maternal Grandfather    Allergies: Allergies  Allergen Reactions  . Metformin Nausea And Vomiting   Medications: See med rec.  Review of Systems: No fevers, chills, night sweats, weight loss, chest pain, or shortness of breath.   Objective:    General: Well Developed, well nourished, and in no acute distress.  Neuro: Alert and oriented x3, extra-ocular muscles intact, sensation grossly intact.  HEENT: Normocephalic, atraumatic, pupils equal round reactive to light, neck supple, no masses, no lymphadenopathy, thyroid nonpalpable.  Skin: Warm and dry, no rashes. Cardiac: Regular rate and rhythm, no murmurs rubs or gallops, no lower extremity edema.  Respiratory: Clear to auscultation  bilaterally. Not using accessory muscles, speaking in full sentences.  Impression and Recommendations:    Annual physical exam Checking routine labs, Cologuard.  Chronic systolic heart failure (HCC) Euvolemic, asymptomatic. Echocardiogram is up-to-date.   Controlled type 2 diabetes mellitus without complication, without long-term current use of insulin (HCC) Checking routine diabetic labs, continue medications.  Essential hypertension, benign Well-controlled, no changes, rechecking potassium  Gout Rechecking uric acid levels.  Left shoulder pain 2 years ago I did a subacromial and glenohumeral injection under ultrasound guidance, he has done well  until recently and only has minimal discomfort. Symptoms are mostly impingement, adding rotator cuff rehabilitation exercises and Thera-Band. Injection if not better in 4 to 6 weeks. ___________________________________________ Ihor Austin. Benjamin Stain, M.D., ABFM., CAQSM. Primary Care and Sports Medicine [facility]  Adjunct Professor of Family Medicine  [employer]

## 2018-07-28 NOTE — Assessment & Plan Note (Signed)
Well-controlled, no changes, rechecking potassium

## 2018-07-28 NOTE — Assessment & Plan Note (Signed)
Rechecking uric acid levels. 

## 2018-07-28 NOTE — Assessment & Plan Note (Signed)
Euvolemic, asymptomatic. Echocardiogram is up-to-date.

## 2018-07-28 NOTE — Assessment & Plan Note (Signed)
Checking routine diabetic labs, continue medications.

## 2018-07-28 NOTE — Assessment & Plan Note (Signed)
2 years ago I did a subacromial and glenohumeral injection under ultrasound guidance, he has done well until recently and only has minimal discomfort. Symptoms are mostly impingement, adding rotator cuff rehabilitation exercises and Thera-Band. Injection if not better in 4 to 6 weeks.

## 2018-07-28 NOTE — Assessment & Plan Note (Signed)
Checking routine labs, Cologuard.

## 2018-07-30 LAB — COMPREHENSIVE METABOLIC PANEL
AG Ratio: 1.5 (calc) (ref 1.0–2.5)
ALT: 62 U/L — ABNORMAL HIGH (ref 9–46)
AST: 44 U/L — ABNORMAL HIGH (ref 10–40)
Albumin: 4.4 g/dL (ref 3.6–5.1)
Alkaline phosphatase (APISO): 49 U/L (ref 40–115)
BUN/Creatinine Ratio: 16 (calc) (ref 6–22)
BUN: 23 mg/dL (ref 7–25)
CO2: 26 mmol/L (ref 20–32)
Calcium: 9.5 mg/dL (ref 8.6–10.3)
Chloride: 98 mmol/L (ref 98–110)
Creat: 1.42 mg/dL — ABNORMAL HIGH (ref 0.60–1.35)
Globulin: 2.9 g/dL (calc) (ref 1.9–3.7)
Glucose, Bld: 227 mg/dL — ABNORMAL HIGH (ref 65–99)
Potassium: 4.5 mmol/L (ref 3.5–5.3)
Sodium: 134 mmol/L — ABNORMAL LOW (ref 135–146)
Total Bilirubin: 1 mg/dL (ref 0.2–1.2)
Total Protein: 7.3 g/dL (ref 6.1–8.1)

## 2018-07-30 LAB — LIPID PANEL W/REFLEX DIRECT LDL
Cholesterol: 175 mg/dL (ref <200)
HDL: 43 mg/dL (ref >40)
LDL Cholesterol (Calc): 108 mg/dL (calc) — ABNORMAL HIGH
Non-HDL Cholesterol (Calc): 132 mg/dL (calc) — ABNORMAL HIGH (ref <130)
Total CHOL/HDL Ratio: 4.1 (calc) (ref <5.0)
Triglycerides: 127 mg/dL (ref <150)

## 2018-07-30 LAB — CBC
HCT: 45.3 % (ref 38.5–50.0)
Hemoglobin: 15.8 g/dL (ref 13.2–17.1)
MCH: 30.8 pg (ref 27.0–33.0)
MCHC: 34.9 g/dL (ref 32.0–36.0)
MCV: 88.3 fL (ref 80.0–100.0)
MPV: 11.8 fL (ref 7.5–12.5)
Platelets: 204 10*3/uL (ref 140–400)
RBC: 5.13 10*6/uL (ref 4.20–5.80)
RDW: 13.5 % (ref 11.0–15.0)
WBC: 7.8 10*3/uL (ref 3.8–10.8)

## 2018-07-30 LAB — PSA, TOTAL AND FREE
PSA, % Free: 14 % (calc) — ABNORMAL LOW (ref >25)
PSA, Free: 0.1 ng/mL
PSA, Total: 0.7 ng/mL (ref <=4.0)

## 2018-07-30 LAB — HEMOGLOBIN A1C
Hgb A1c MFr Bld: 7.6 % of total Hgb — ABNORMAL HIGH (ref <5.7)
Mean Plasma Glucose: 171 (calc)
eAG (mmol/L): 9.5 (calc)

## 2018-07-30 LAB — TSH: TSH: 2.39 mIU/L (ref 0.40–4.50)

## 2018-07-30 LAB — URIC ACID: Uric Acid, Serum: 9.8 mg/dL — ABNORMAL HIGH (ref 4.0–8.0)

## 2018-08-09 ENCOUNTER — Other Ambulatory Visit: Payer: Self-pay | Admitting: Sports Medicine

## 2018-08-11 ENCOUNTER — Encounter: Payer: Self-pay | Admitting: Sports Medicine

## 2018-08-11 DIAGNOSIS — E118 Type 2 diabetes mellitus with unspecified complications: Secondary | ICD-10-CM

## 2018-08-11 MED ORDER — ALLOPURINOL 300 MG PO TABS: 300.0000 mg | ORAL_TABLET | Freq: Two times a day (BID) | ORAL | 3 refills | Status: DC

## 2018-08-11 MED ORDER — CARVEDILOL 25 MG PO TABS: ORAL_TABLET | ORAL | 0 refills | Status: DC

## 2018-08-11 MED ORDER — AMLODIPINE BESYLATE 10 MG PO TABS: 10.0000 mg | ORAL_TABLET | Freq: Every day | ORAL | 1 refills | Status: DC

## 2018-08-11 MED ORDER — LISINOPRIL 2.5 MG PO TABS: 2.5000 mg | ORAL_TABLET | Freq: Every day | ORAL | 3 refills | Status: DC

## 2018-08-11 MED ORDER — SPIRONOLACTONE 25 MG PO TABS: 25.0000 mg | ORAL_TABLET | Freq: Two times a day (BID) | ORAL | 3 refills | Status: DC

## 2018-08-11 MED ORDER — ASPIRIN EC 81 MG PO TBEC: 81.0000 mg | DELAYED_RELEASE_TABLET | Freq: Every day | ORAL | 3 refills | Status: DC

## 2018-08-11 MED ORDER — CANAGLIFLOZIN 300 MG PO TABS: ORAL_TABLET | ORAL | 3 refills | Status: DC

## 2018-08-11 MED ORDER — FUROSEMIDE 40 MG PO TABS: ORAL_TABLET | ORAL | 3 refills | Status: DC

## 2018-08-11 MED ORDER — LEVOTHYROXINE SODIUM 50 MCG PO TABS: ORAL_TABLET | ORAL | 3 refills | Status: DC

## 2018-08-11 MED ORDER — GLIPIZIDE 10 MG PO TABS: ORAL_TABLET | ORAL | 3 refills | Status: DC

## 2018-08-20 ENCOUNTER — Ambulatory Visit (INDEPENDENT_AMBULATORY_CARE_PROVIDER_SITE_OTHER): Payer: BLUE CROSS/BLUE SHIELD

## 2018-08-20 DIAGNOSIS — I428 Other cardiomyopathies: Secondary | ICD-10-CM | POA: Diagnosis not present

## 2018-08-20 DIAGNOSIS — I5022 Chronic systolic (congestive) heart failure: Secondary | ICD-10-CM

## 2018-08-20 NOTE — Progress Notes (Signed)
Remote ICD transmission.   

## 2018-09-06 LAB — COLOGUARD: Cologuard: NEGATIVE

## 2018-09-11 ENCOUNTER — Encounter: Payer: Self-pay | Admitting: Sports Medicine

## 2018-10-10 LAB — CUP PACEART REMOTE DEVICE CHECK
Battery Remaining Longevity: 36 mo
Battery Remaining Percentage: 43 %
Battery Voltage: 2.95 V
Brady Statistic AP VP Percent: 17 %
Brady Statistic AP VS Percent: 1 %
Brady Statistic AS VP Percent: 83 %
Brady Statistic AS VS Percent: 1 %
Brady Statistic RA Percent Paced: 16 %
Date Time Interrogation Session: 20191127082816
HighPow Impedance: 89 Ohm
HighPow Impedance: 89 Ohm
Implantable Lead Implant Date: 20160407
Implantable Lead Implant Date: 20160407
Implantable Lead Implant Date: 20160407
Implantable Lead Location: 753858
Implantable Lead Location: 753859
Implantable Lead Location: 753860
Implantable Lead Model: 7122
Implantable Pulse Generator Implant Date: 20160407
Lead Channel Impedance Value: 450 Ohm
Lead Channel Impedance Value: 600 Ohm
Lead Channel Impedance Value: 660 Ohm
Lead Channel Pacing Threshold Amplitude: 0.5 V
Lead Channel Pacing Threshold Amplitude: 0.5 V
Lead Channel Pacing Threshold Amplitude: 2 V
Lead Channel Pacing Threshold Pulse Width: 0.4 ms
Lead Channel Pacing Threshold Pulse Width: 0.4 ms
Lead Channel Pacing Threshold Pulse Width: 0.5 ms
Lead Channel Sensing Intrinsic Amplitude: 1.6 mV
Lead Channel Sensing Intrinsic Amplitude: 12 mV
Lead Channel Setting Pacing Amplitude: 2 V
Lead Channel Setting Pacing Amplitude: 2.5 V
Lead Channel Setting Pacing Amplitude: 2.5 V
Lead Channel Setting Pacing Pulse Width: 0.4 ms
Lead Channel Setting Pacing Pulse Width: 0.5 ms
Lead Channel Setting Sensing Sensitivity: 0.5 mV
Pulse Gen Serial Number: 7250204

## 2018-10-23 ENCOUNTER — Encounter: Payer: Self-pay | Admitting: Sports Medicine

## 2018-11-19 ENCOUNTER — Ambulatory Visit (INDEPENDENT_AMBULATORY_CARE_PROVIDER_SITE_OTHER): Payer: Self-pay | Admitting: *Deleted

## 2018-11-19 DIAGNOSIS — I5022 Chronic systolic (congestive) heart failure: Secondary | ICD-10-CM

## 2018-11-19 DIAGNOSIS — I428 Other cardiomyopathies: Secondary | ICD-10-CM

## 2018-11-20 LAB — CUP PACEART REMOTE DEVICE CHECK
Battery Remaining Longevity: 32 mo
Battery Remaining Percentage: 39 %
Battery Voltage: 2.95 V
Brady Statistic AP VP Percent: 15 %
Brady Statistic AP VS Percent: 1 %
Brady Statistic AS VP Percent: 85 %
Brady Statistic AS VS Percent: 1 %
Brady Statistic RA Percent Paced: 14 %
Date Time Interrogation Session: 20200226070016
HighPow Impedance: 89 Ohm
HighPow Impedance: 89 Ohm
Implantable Lead Implant Date: 20160407
Implantable Lead Implant Date: 20160407
Implantable Lead Implant Date: 20160407
Implantable Lead Location: 753858
Implantable Lead Location: 753859
Implantable Lead Location: 753860
Implantable Lead Model: 7122
Implantable Pulse Generator Implant Date: 20160407
Lead Channel Impedance Value: 440 Ohm
Lead Channel Impedance Value: 450 Ohm
Lead Channel Impedance Value: 640 Ohm
Lead Channel Pacing Threshold Amplitude: 0.5 V
Lead Channel Pacing Threshold Amplitude: 0.5 V
Lead Channel Pacing Threshold Amplitude: 1.875 V
Lead Channel Pacing Threshold Pulse Width: 0.4 ms
Lead Channel Pacing Threshold Pulse Width: 0.4 ms
Lead Channel Pacing Threshold Pulse Width: 0.5 ms
Lead Channel Sensing Intrinsic Amplitude: 12 mV
Lead Channel Sensing Intrinsic Amplitude: 2.1 mV
Lead Channel Setting Pacing Amplitude: 2 V
Lead Channel Setting Pacing Amplitude: 2.5 V
Lead Channel Setting Pacing Amplitude: 2.5 V
Lead Channel Setting Pacing Pulse Width: 0.4 ms
Lead Channel Setting Pacing Pulse Width: 0.5 ms
Lead Channel Setting Sensing Sensitivity: 0.5 mV
Pulse Gen Serial Number: 7250204

## 2018-11-26 NOTE — Progress Notes (Signed)
Remote ICD transmission.   

## 2018-12-29 ENCOUNTER — Telehealth: Payer: Self-pay

## 2018-12-29 NOTE — Telephone Encounter (Signed)
Pt states since he moved his monitor been red where he now lives. The pt needs a new cell adapter for his [employer] monitor mailed to him. The pt new address is [address], [place], [place] [address]. I told him if the monitor still does not work after he get the new cell adapter to call us back.

## 2018-12-30 NOTE — Telephone Encounter (Signed)
Spoke with patient. Advised monitor is up to date as of 12/27/18. He has been at his new address for over a month. Advised I'm not sure why the light was red, but the monitor is working as intended. Advised if we have any issue getting his next automatic transmission in May we will reassess if he needs a new adapter or monitor at that point. Pt verbalizes understanding and thanked me for my call.

## 2019-01-21 ENCOUNTER — Encounter: Payer: Self-pay | Admitting: Sports Medicine

## 2019-01-21 NOTE — Telephone Encounter (Signed)
I will be checking his A1c, as well as other vital signs and labs, lets do it in the office.

## 2019-01-26 ENCOUNTER — Encounter: Payer: Self-pay | Admitting: Sports Medicine

## 2019-01-26 ENCOUNTER — Ambulatory Visit (INDEPENDENT_AMBULATORY_CARE_PROVIDER_SITE_OTHER): Payer: BLUE CROSS/BLUE SHIELD | Admitting: Sports Medicine

## 2019-01-26 VITALS — BP 133/85 | HR 76 | Ht 70.0 in | Wt 301.0 lb

## 2019-01-26 DIAGNOSIS — L918 Other hypertrophic disorders of the skin: Secondary | ICD-10-CM | POA: Insufficient documentation

## 2019-01-26 DIAGNOSIS — E118 Type 2 diabetes mellitus with unspecified complications: Secondary | ICD-10-CM

## 2019-01-26 DIAGNOSIS — E119 Type 2 diabetes mellitus without complications: Secondary | ICD-10-CM

## 2019-01-26 DIAGNOSIS — M25512 Pain in left shoulder: Secondary | ICD-10-CM | POA: Diagnosis not present

## 2019-01-26 LAB — POCT GLYCOSYLATED HEMOGLOBIN (HGB A1C): Hemoglobin A1C: 10.5 % — AB (ref 4.0–5.6)

## 2019-01-26 MED ORDER — EMPAGLIFLOZIN 25 MG PO TABS: 25.0000 mg | ORAL_TABLET | Freq: Every day | ORAL | 5 refills | Status: DC

## 2019-01-26 NOTE — Assessment & Plan Note (Signed)
Unable to afford Invokana, noncompliant with glipizide. Switching to Jardiance, this has some heart failure data as well. We can recheck his A1c in 3 months. Avoiding metformin due to heart failure.

## 2019-01-26 NOTE — Progress Notes (Signed)
Subjective:    CC: Follow-up multiple issues  HPI: Diabetes mellitus type 2: Has not been doing Invokana, noncompliant with glipizide.  Shoulder pain: Left-sided, last injection was approximately 2.5 years ago, pain is over the deltoid and worse with abduction, waking him from sleep, moderate, persistent, localized without radiation.  Hypertension: Well-controlled.  Skin tags: Would like these removed, present on the face and eyelids.  I reviewed the past medical history, family history, social history, surgical history, and allergies today and no changes were needed.  Please see the problem list section below in epic for further details.  Past Medical History: Past Medical History:  Diagnosis Date  . CHF (congestive heart failure) (HCC)   . Diabetes mellitus without complication (HCC)   . Gout   . Hypertension   . LBBB (left bundle branch block)   . Non-ischemic cardiomyopathy (HCC)    a. s/p STJ CRTD  . PVC (premature ventricular contraction)    Past Surgical History: Past Surgical History:  Procedure Laterality Date  . HIP SURGERY    . IMPLANTABLE CARDIOVERTER DEFIBRILLATOR IMPLANT N/A 12/30/2014   STJ CRTD implanted by Dr Ladona Ridgel  . WRIST SURGERY     Social History: Social History   Socioeconomic History  . Marital status: Single    Spouse name: Not on file  . Number of children: Not on file  . Years of education: Not on file  . Highest education level: Not on file  Occupational History  . Not on file  Social Needs  . Financial resource strain: Not on file  . Food insecurity:    Worry: Not on file    Inability: Not on file  . Transportation needs:    Medical: Not on file    Non-medical: Not on file  Tobacco Use  . Smoking status: Never Smoker  . Smokeless tobacco: Never Used  Substance and Sexual Activity  . Alcohol use: Yes    Comment: social  . Drug use: No  . Sexual activity: Not on file  Lifestyle  . Physical activity:    Days per week: Not on  file    Minutes per session: Not on file  . Stress: Not on file  Relationships  . Social connections:    Talks on phone: Not on file    Gets together: Not on file    Attends religious service: Not on file    Active member of club or organization: Not on file    Attends meetings of clubs or organizations: Not on file    Relationship status: Not on file  Other Topics Concern  . Not on file  Social History Narrative  . Not on file   Family History: Family History  Problem Relation Age of Onset  . Heart attack Father   . Hypertension Father   . Depression Sister   . Alcohol abuse Maternal Grandmother   . Alcohol abuse Maternal Grandfather    Allergies: Allergies  Allergen Reactions  . Metformin Nausea And Vomiting   Medications: See med rec.  Review of Systems: No fevers, chills, night sweats, weight loss, chest pain, or shortness of breath.   Objective:    General: Well Developed, well nourished, and in no acute distress.  Neuro: Alert and oriented x3, extra-ocular muscles intact, sensation grossly intact.  HEENT: Normocephalic, atraumatic, pupils equal round reactive to light, neck supple, no masses, no lymphadenopathy, thyroid nonpalpable.  Several skin tags around the eyelids.   Skin: Warm and dry, no rashes. Cardiac: Regular  rate and rhythm, no murmurs rubs or gallops, no lower extremity edema.  Respiratory: Clear to auscultation bilaterally. Not using accessory muscles, speaking in full sentences. Left shoulder: Inspection reveals no abnormalities, atrophy or asymmetry. Palpation is normal with no tenderness over AC joint or bicipital groove. ROM is full in all planes. Rotator cuff strength normal throughout. Positive Neer and Hawkin's tests, empty can. Speeds and Yergason's tests normal. No labral pathology noted with negative Obrien's, negative crank, negative clunk, and good stability. Normal scapular function observed. No painful arc and no drop arm sign. No  apprehension sign  Procedure:  Cryodestruction of skin tags on the face, 1 on the right lower eyelid, 3 on the left lower eyelid Consent obtained and verified. Time-out conducted. Noted no overlying erythema, induration, or other signs of local infection. Completed without difficulty using Cryo-Gun. Advised to call if fevers/chills, erythema, induration, drainage, or persistent bleeding.  Procedure: Real-time Ultrasound Guided injection of the left subacromial bursa Device: GE Logiq E  Verbal informed consent obtained.  Time-out conducted.  Noted no overlying erythema, induration, or other signs of local infection.  Skin prepped in a sterile fashion.  Local anesthesia: Topical Ethyl chloride.  With sterile technique and under real time ultrasound guidance:  1 cc Kenalog 40, 1 cc lidocaine, 1 cc bupivacaine injected easily Completed without difficulty  Pain immediately resolved suggesting accurate placement of the medication.  Advised to call if fevers/chills, erythema, induration, drainage, or persistent bleeding.  Images permanently stored and available for review in the ultrasound unit.  Impression: Technically successful ultrasound guided injection.  Impression and Recommendations:    Left shoulder pain Subacromial injection today, last injection was approximately 2 years ago. Rehab exercises and Thera-Band given.  Skin tag Cryotherapy x4  Controlled type 2 diabetes mellitus without complication, without long-term current use of insulin (HCC) Unable to afford Invokana, noncompliant with glipizide. Switching to Jardiance, this has some heart failure data as well. We can recheck his A1c in 3 months. Avoiding metformin due to heart failure.   ___________________________________________ Ihor Austin. Benjamin Stain, M.D., ABFM., CAQSM. Primary Care and Sports Medicine [facility]  Adjunct Professor of Family Medicine  [employer]  [employer]

## 2019-01-26 NOTE — Assessment & Plan Note (Signed)
Cryotherapy x4

## 2019-01-26 NOTE — Assessment & Plan Note (Signed)
Subacromial injection today, last injection was approximately 2 years ago. Rehab exercises and Thera-Band given.

## 2019-02-02 ENCOUNTER — Encounter: Payer: Self-pay | Admitting: Sports Medicine

## 2019-02-02 ENCOUNTER — Ambulatory Visit (INDEPENDENT_AMBULATORY_CARE_PROVIDER_SITE_OTHER): Payer: BLUE CROSS/BLUE SHIELD | Admitting: Sports Medicine

## 2019-02-02 ENCOUNTER — Other Ambulatory Visit: Payer: Self-pay | Admitting: Sports Medicine

## 2019-02-02 VITALS — BP 123/85 | HR 86 | Ht 70.0 in | Wt 293.0 lb

## 2019-02-02 DIAGNOSIS — E119 Type 2 diabetes mellitus without complications: Secondary | ICD-10-CM

## 2019-02-02 DIAGNOSIS — E118 Type 2 diabetes mellitus with unspecified complications: Secondary | ICD-10-CM

## 2019-02-02 LAB — POCT CBG (FASTING - GLUCOSE)-MANUAL ENTRY: Glucose Fasting, POC: HIGH mg/dL (ref 70–99)

## 2019-02-02 MED ORDER — AMBULATORY NON FORMULARY MEDICATION: 0 refills | Status: DC

## 2019-02-02 MED ORDER — GLIPIZIDE ER 10 MG PO TB24: 20.0000 mg | ORAL_TABLET | Freq: Every day | ORAL | 3 refills | Status: DC

## 2019-02-02 MED ORDER — FREESTYLE LIBRE 14 DAY SENSOR MISC: 1.0000 "application " | 11 refills | Status: DC

## 2019-02-02 MED ORDER — DEXCOM G6 TRANSMITTER MISC: 1.0000 | Freq: Every day | 11 refills | Status: DC

## 2019-02-02 MED ORDER — DEXCOM G6 RECEIVER DEVI: 1.0000 | Freq: Every day | 11 refills | Status: DC

## 2019-02-02 MED ORDER — FREESTYLE LIBRE 14 DAY READER DEVI: 1.0000 "application " | 0 refills | Status: DC

## 2019-02-02 NOTE — Telephone Encounter (Signed)
Called Pt and scheduled for OV today

## 2019-02-02 NOTE — Assessment & Plan Note (Signed)
Continue current medications, I am going to switch him to extended release glipizide. His blood sugar did go up relatively high to 500, likely secondary to the steroid injection. I do not think he is going to need insulin, we simply need to give it time. Adding the freestyle libre blood sugar meter. I am also going to write them a prescription for a generic stick and check meter as well.

## 2019-02-02 NOTE — Progress Notes (Signed)
Subjective:    CC: ER follow-up  HPI: This is a pleasant 53 year old male with diabetes mellitus type 2, he had been initially noncompliant with his medication regimen.  We got him on a new regimen, he has been taking it appropriately, but still occasionally misses some of the evening doses of glipizide.  Several days ago we did a steroid injection for his shoulder, and a couple of days he noted blood sugars as high as 4-500.  He went to the ED, was discharged and referred back to me for further evaluation.  His blood sugars have steadily trended downward, more recently 300 earlier today.  No headaches, visual changes, chest pain.  I reviewed the past medical history, family history, social history, surgical history, and allergies today and no changes were needed.  Please see the problem list section below in epic for further details.  Past Medical History: Past Medical History:  Diagnosis Date  . CHF (congestive heart failure) (HCC)   . Diabetes mellitus without complication (HCC)   . Gout   . Hypertension   . LBBB (left bundle branch block)   . Non-ischemic cardiomyopathy (HCC)    a. s/p STJ CRTD  . PVC (premature ventricular contraction)    Past Surgical History: Past Surgical History:  Procedure Laterality Date  . HIP SURGERY    . IMPLANTABLE CARDIOVERTER DEFIBRILLATOR IMPLANT N/A 12/30/2014   STJ CRTD implanted by Dr Ladona Ridgel  . WRIST SURGERY     Social History: Social History   Socioeconomic History  . Marital status: Single    Spouse name: Not on file  . Number of children: Not on file  . Years of education: Not on file  . Highest education level: Not on file  Occupational History  . Not on file  Social Needs  . Financial resource strain: Not on file  . Food insecurity:    Worry: Not on file    Inability: Not on file  . Transportation needs:    Medical: Not on file    Non-medical: Not on file  Tobacco Use  . Smoking status: Never Smoker  . Smokeless tobacco:  Never Used  Substance and Sexual Activity  . Alcohol use: Yes    Comment: social  . Drug use: No  . Sexual activity: Not on file  Lifestyle  . Physical activity:    Days per week: Not on file    Minutes per session: Not on file  . Stress: Not on file  Relationships  . Social connections:    Talks on phone: Not on file    Gets together: Not on file    Attends religious service: Not on file    Active member of club or organization: Not on file    Attends meetings of clubs or organizations: Not on file    Relationship status: Not on file  Other Topics Concern  . Not on file  Social History Narrative  . Not on file   Family History: Family History  Problem Relation Age of Onset  . Heart attack Father   . Hypertension Father   . Depression Sister   . Alcohol abuse Maternal Grandmother   . Alcohol abuse Maternal Grandfather    Allergies: Allergies  Allergen Reactions  . Metformin Nausea And Vomiting   Medications: See med rec.  Review of Systems: No fevers, chills, night sweats, weight loss, chest pain, or shortness of breath.   Objective:    General: Well Developed, well nourished, and in no acute  distress.  Neuro: Alert and oriented x3, extra-ocular muscles intact, sensation grossly intact.  HEENT: Normocephalic, atraumatic, pupils equal round reactive to light, neck supple, no masses, no lymphadenopathy, thyroid nonpalpable.  Skin: Warm and dry, no rashes. Cardiac: Regular rate and rhythm, no murmurs rubs or gallops, no lower extremity edema.  Respiratory: Clear to auscultation bilaterally. Not using accessory muscles, speaking in full sentences.  Impression and Recommendations:    Controlled type 2 diabetes mellitus without complication, without long-term current use of insulin (HCC) Continue current medications, I am going to switch him to extended release glipizide. His blood sugar did go up relatively high to 500, likely secondary to the steroid injection. I  do not think he is going to need insulin, we simply need to give it time. Adding the freestyle libre blood sugar meter. I am also going to write them a prescription for a generic stick and check meter as well.  I spent 25 minutes with this patient, greater than 50% was face-to-face time counseling regarding the above diagnoses.  ___________________________________________ Ihor Austin. Benjamin Stain, M.D., ABFM., CAQSM. Primary Care and Sports Medicine [place] [facility]  Adjunct Professor of Family Medicine  [employer]

## 2019-02-19 ENCOUNTER — Ambulatory Visit (INDEPENDENT_AMBULATORY_CARE_PROVIDER_SITE_OTHER): Payer: BLUE CROSS/BLUE SHIELD | Admitting: *Deleted

## 2019-02-19 DIAGNOSIS — I5022 Chronic systolic (congestive) heart failure: Secondary | ICD-10-CM

## 2019-02-19 DIAGNOSIS — I428 Other cardiomyopathies: Secondary | ICD-10-CM | POA: Diagnosis not present

## 2019-02-21 LAB — CUP PACEART REMOTE DEVICE CHECK
Date Time Interrogation Session: 20200530091509
Implantable Lead Implant Date: 20160407
Implantable Lead Implant Date: 20160407
Implantable Lead Implant Date: 20160407
Implantable Lead Location: 753858
Implantable Lead Location: 753859
Implantable Lead Location: 753860
Implantable Lead Model: 7122
Implantable Pulse Generator Implant Date: 20160407
Pulse Gen Serial Number: 7250204

## 2019-02-26 ENCOUNTER — Encounter: Payer: Self-pay | Admitting: Cardiology

## 2019-02-26 NOTE — Progress Notes (Signed)
Remote ICD transmission.   

## 2019-03-03 ENCOUNTER — Encounter: Payer: Self-pay | Admitting: Sports Medicine

## 2019-03-03 DIAGNOSIS — E119 Type 2 diabetes mellitus without complications: Secondary | ICD-10-CM

## 2019-03-04 MED ORDER — DAPAGLIFLOZIN PRO-METFORMIN ER 10-1000 MG PO TB24: 1.0000 | ORAL_TABLET | Freq: Every day | ORAL | 11 refills | Status: DC

## 2019-03-04 NOTE — Assessment & Plan Note (Signed)
Historically stable, switch to extended release glipizide. Switching from [facility] [place] to [place].

## 2019-03-10 ENCOUNTER — Encounter: Payer: Self-pay | Admitting: Sports Medicine

## 2019-03-10 DIAGNOSIS — E119 Type 2 diabetes mellitus without complications: Secondary | ICD-10-CM

## 2019-03-12 MED ORDER — GLIPIZIDE-METFORMIN HCL 2.5-500 MG PO TABS: 2.0000 | ORAL_TABLET | Freq: Two times a day (BID) | ORAL | 11 refills | Status: DC

## 2019-03-12 MED ORDER — OZEMPIC (0.25 OR 0.5 MG/DOSE) 2 MG/1.5ML ~~LOC~~ SOPN: 1.0000 | PEN_INJECTOR | SUBCUTANEOUS | 1 refills | Status: DC

## 2019-03-12 MED ORDER — OZEMPIC (1 MG/DOSE) 2 MG/1.5ML ~~LOC~~ SOPN: 1.0000 mg | PEN_INJECTOR | SUBCUTANEOUS | 11 refills | Status: DC

## 2019-03-12 NOTE — Assessment & Plan Note (Signed)
We have had a lot of difficulties getting something that his insurance will cover, looking at his formulary, we will use only tier 1 and tier 2 drugs, glipizide/metformin 5/500 mg 2 tabs twice a day. Ozempic in an up taper, he will download a discount coupon.

## 2019-03-24 ENCOUNTER — Other Ambulatory Visit: Payer: Self-pay | Admitting: Sports Medicine

## 2019-03-24 MED ORDER — CARVEDILOL 25 MG PO TABS: 25.0000 mg | ORAL_TABLET | Freq: Two times a day (BID) | ORAL | 1 refills | Status: DC

## 2019-04-28 ENCOUNTER — Ambulatory Visit: Payer: BLUE CROSS/BLUE SHIELD | Admitting: Sports Medicine

## 2019-05-21 ENCOUNTER — Ambulatory Visit (INDEPENDENT_AMBULATORY_CARE_PROVIDER_SITE_OTHER): Payer: BC Managed Care – PPO | Admitting: *Deleted

## 2019-05-21 DIAGNOSIS — I5022 Chronic systolic (congestive) heart failure: Secondary | ICD-10-CM

## 2019-05-21 LAB — CUP PACEART REMOTE DEVICE CHECK
Battery Remaining Longevity: 28 mo
Battery Remaining Percentage: 33 %
Battery Voltage: 2.93 V
Brady Statistic AP VP Percent: 11 %
Brady Statistic AP VS Percent: 1 %
Brady Statistic AS VP Percent: 88 %
Brady Statistic AS VS Percent: 1 %
Brady Statistic RA Percent Paced: 11 %
Brady Statistic RV Percent Paced: 99 %
Date Time Interrogation Session: 20200827060020
HighPow Impedance: 88 Ohm
HighPow Impedance: 88 Ohm
Implantable Lead Implant Date: 20160407
Implantable Lead Implant Date: 20160407
Implantable Lead Implant Date: 20160407
Implantable Lead Location: 753858
Implantable Lead Location: 753859
Implantable Lead Location: 753860
Implantable Lead Model: 7122
Implantable Pulse Generator Implant Date: 20160407
Lead Channel Impedance Value: 450 Ohm
Lead Channel Impedance Value: 490 Ohm
Lead Channel Impedance Value: 660 Ohm
Lead Channel Pacing Threshold Amplitude: 1.875 V
Lead Channel Pacing Threshold Pulse Width: 0.5 ms
Lead Channel Sensing Intrinsic Amplitude: 12 mV
Lead Channel Sensing Intrinsic Amplitude: 2.6 mV
Lead Channel Setting Pacing Amplitude: 2 V
Lead Channel Setting Pacing Amplitude: 2.5 V
Lead Channel Setting Pacing Amplitude: 2.5 V
Lead Channel Setting Pacing Pulse Width: 0.4 ms
Lead Channel Setting Pacing Pulse Width: 0.5 ms
Lead Channel Setting Sensing Sensitivity: 0.5 mV
Pulse Gen Serial Number: 7250204

## 2019-05-24 ENCOUNTER — Other Ambulatory Visit: Payer: Self-pay | Admitting: Sports Medicine

## 2019-05-24 DIAGNOSIS — E119 Type 2 diabetes mellitus without complications: Secondary | ICD-10-CM

## 2019-05-28 NOTE — Progress Notes (Signed)
Remote ICD transmission.   

## 2019-08-23 LAB — CUP PACEART REMOTE DEVICE CHECK
Battery Remaining Longevity: 25 mo
Battery Remaining Percentage: 30 %
Battery Voltage: 2.93 V
Brady Statistic AP VP Percent: 11 %
Brady Statistic AP VS Percent: 1 %
Brady Statistic AS VP Percent: 88 %
Brady Statistic AS VS Percent: 1 %
Brady Statistic RA Percent Paced: 10 %
Date Time Interrogation Session: 20201126131612
HighPow Impedance: 88 Ohm
HighPow Impedance: 88 Ohm
Implantable Lead Implant Date: 20160407
Implantable Lead Implant Date: 20160407
Implantable Lead Implant Date: 20160407
Implantable Lead Location: 753858
Implantable Lead Location: 753859
Implantable Lead Location: 753860
Implantable Lead Model: 7122
Implantable Pulse Generator Implant Date: 20160407
Lead Channel Impedance Value: 440 Ohm
Lead Channel Impedance Value: 450 Ohm
Lead Channel Impedance Value: 640 Ohm
Lead Channel Pacing Threshold Amplitude: 0.5 V
Lead Channel Pacing Threshold Amplitude: 0.5 V
Lead Channel Pacing Threshold Amplitude: 1.75 V
Lead Channel Pacing Threshold Pulse Width: 0.4 ms
Lead Channel Pacing Threshold Pulse Width: 0.4 ms
Lead Channel Pacing Threshold Pulse Width: 0.5 ms
Lead Channel Sensing Intrinsic Amplitude: 1 mV
Lead Channel Sensing Intrinsic Amplitude: 12 mV
Lead Channel Setting Pacing Amplitude: 2 V
Lead Channel Setting Pacing Amplitude: 2.5 V
Lead Channel Setting Pacing Amplitude: 2.5 V
Lead Channel Setting Pacing Pulse Width: 0.4 ms
Lead Channel Setting Pacing Pulse Width: 0.5 ms
Lead Channel Setting Sensing Sensitivity: 0.5 mV
Pulse Gen Serial Number: 7250204

## 2019-08-24 ENCOUNTER — Ambulatory Visit (INDEPENDENT_AMBULATORY_CARE_PROVIDER_SITE_OTHER): Payer: BC Managed Care – PPO | Admitting: *Deleted

## 2019-08-24 DIAGNOSIS — I5022 Chronic systolic (congestive) heart failure: Secondary | ICD-10-CM

## 2019-09-16 NOTE — Progress Notes (Signed)
ICD remote 

## 2019-09-29 ENCOUNTER — Other Ambulatory Visit: Payer: Self-pay | Admitting: *Deleted

## 2019-09-29 DIAGNOSIS — E119 Type 2 diabetes mellitus without complications: Secondary | ICD-10-CM

## 2019-09-29 MED ORDER — GLIPIZIDE ER 10 MG PO TB24: 20.0000 mg | ORAL_TABLET | Freq: Every day | ORAL | 0 refills | Status: DC

## 2019-10-13 ENCOUNTER — Other Ambulatory Visit: Payer: Self-pay | Admitting: *Deleted

## 2019-10-13 MED ORDER — CARVEDILOL 25 MG PO TABS: 25.0000 mg | ORAL_TABLET | Freq: Two times a day (BID) | ORAL | 0 refills | Status: DC

## 2019-11-06 ENCOUNTER — Other Ambulatory Visit: Payer: Self-pay | Admitting: Sports Medicine

## 2019-11-06 DIAGNOSIS — E119 Type 2 diabetes mellitus without complications: Secondary | ICD-10-CM

## 2019-11-10 ENCOUNTER — Other Ambulatory Visit: Payer: Self-pay

## 2019-11-10 ENCOUNTER — Ambulatory Visit: Payer: BC Managed Care – PPO | Admitting: Sports Medicine

## 2019-11-10 ENCOUNTER — Encounter: Payer: Self-pay | Admitting: Sports Medicine

## 2019-11-10 DIAGNOSIS — E119 Type 2 diabetes mellitus without complications: Secondary | ICD-10-CM

## 2019-11-10 DIAGNOSIS — Z23 Encounter for immunization: Secondary | ICD-10-CM | POA: Diagnosis not present

## 2019-11-10 DIAGNOSIS — L081 Erythrasma: Secondary | ICD-10-CM

## 2019-11-10 MED ORDER — METFORMIN HCL 1000 MG PO TABS: 1000.0000 mg | ORAL_TABLET | Freq: Two times a day (BID) | ORAL | 3 refills | Status: DC

## 2019-11-10 MED ORDER — OZEMPIC (0.25 OR 0.5 MG/DOSE) 2 MG/1.5ML ~~LOC~~ SOPN: 1.0000 | PEN_INJECTOR | SUBCUTANEOUS | 1 refills | Status: DC

## 2019-11-10 MED ORDER — CLARITHROMYCIN 500 MG PO TABS: 1000.0000 mg | ORAL_TABLET | Freq: Once | ORAL | 0 refills | Status: AC

## 2019-11-10 MED ORDER — GLIPIZIDE 10 MG PO TABS: 10.0000 mg | ORAL_TABLET | Freq: Two times a day (BID) | ORAL | 3 refills | Status: DC

## 2019-11-10 MED ORDER — CARVEDILOL 25 MG PO TABS: 25.0000 mg | ORAL_TABLET | Freq: Two times a day (BID) | ORAL | 3 refills | Status: DC

## 2019-11-10 NOTE — Assessment & Plan Note (Signed)
Mohammad has uncontrolled diabetes. He has also not been taking his Ozempic, has not been taking his Metformin. He apparently got some diarrhea with Metformin, but only took it for a couple of days, we are going to restart this, he can do some Imodium with the dosing of Metformin, and he understands now that after a week or so the diarrhea typically resolves. Also going to send in Ozempic again, he does have new insurance, he will download a discount coupon. I am going to pull the trigger for his diabetic eye exam as well.

## 2019-11-10 NOTE — Progress Notes (Signed)
    Procedures performed today:    None.  Independent interpretation of tests performed by another provider:   None.  Impression and Recommendations:    Controlled type 2 diabetes mellitus without complication, without long-term current use of insulin (HCC) [name] has uncontrolled diabetes. He has also not been taking his Ozempic, has not been taking his Metformin. He apparently got some diarrhea with Metformin, but only took it for a couple of days, we are going to restart this, he can do some Imodium with the dosing of Metformin, and he understands now that after a week or so the diarrhea typically resolves. Also going to send in Ozempic again, he does have new insurance, he will download a discount coupon. I am going to pull the trigger for his diabetic eye exam as well.  Erythrasma Bilateral axillary rash nonresponsive to topical antifungals. Because this is fairly extensive we are going to add clarithromycin 1 g p.o. x1 to treat this. He will follow up with me regarding this in a month.    ___________________________________________ Ihor Austin. Andre [name], M.D., ABFM., CAQSM. Primary Care and Sports Medicine [place] [facility]  Adjunct Instructor of Family Medicine  [employer]

## 2019-11-10 NOTE — Assessment & Plan Note (Signed)
Bilateral axillary rash nonresponsive to topical antifungals. Because this is fairly extensive we are going to add clarithromycin 1 g p.o. x1 to treat this. He will follow up with me regarding this in a month.

## 2019-11-23 ENCOUNTER — Ambulatory Visit (INDEPENDENT_AMBULATORY_CARE_PROVIDER_SITE_OTHER): Payer: BC Managed Care – PPO | Admitting: *Deleted

## 2019-11-23 DIAGNOSIS — I5022 Chronic systolic (congestive) heart failure: Secondary | ICD-10-CM

## 2019-11-23 LAB — CUP PACEART REMOTE DEVICE CHECK
Battery Remaining Longevity: 23 mo
Battery Remaining Percentage: 27 %
Battery Voltage: 2.92 V
Brady Statistic AP VP Percent: 10 %
Brady Statistic AP VS Percent: 1 %
Brady Statistic AS VP Percent: 89 %
Brady Statistic AS VS Percent: 1 %
Brady Statistic RA Percent Paced: 9.3 %
Date Time Interrogation Session: 20210301021315
HighPow Impedance: 78 Ohm
HighPow Impedance: 78 Ohm
Implantable Lead Implant Date: 20160407
Implantable Lead Implant Date: 20160407
Implantable Lead Implant Date: 20160407
Implantable Lead Location: 753858
Implantable Lead Location: 753859
Implantable Lead Location: 753860
Implantable Lead Model: 7122
Implantable Pulse Generator Implant Date: 20160407
Lead Channel Impedance Value: 440 Ohm
Lead Channel Impedance Value: 460 Ohm
Lead Channel Impedance Value: 630 Ohm
Lead Channel Pacing Threshold Amplitude: 0.5 V
Lead Channel Pacing Threshold Amplitude: 0.5 V
Lead Channel Pacing Threshold Amplitude: 2.125 V
Lead Channel Pacing Threshold Pulse Width: 0.4 ms
Lead Channel Pacing Threshold Pulse Width: 0.4 ms
Lead Channel Pacing Threshold Pulse Width: 0.5 ms
Lead Channel Sensing Intrinsic Amplitude: 12 mV
Lead Channel Sensing Intrinsic Amplitude: 3.6 mV
Lead Channel Setting Pacing Amplitude: 2 V
Lead Channel Setting Pacing Amplitude: 2.5 V
Lead Channel Setting Pacing Amplitude: 2.5 V
Lead Channel Setting Pacing Pulse Width: 0.4 ms
Lead Channel Setting Pacing Pulse Width: 0.5 ms
Lead Channel Setting Sensing Sensitivity: 0.5 mV
Pulse Gen Serial Number: 7250204

## 2019-11-23 NOTE — Progress Notes (Signed)
ICD Remote  

## 2019-12-02 ENCOUNTER — Other Ambulatory Visit: Payer: Self-pay | Admitting: Sports Medicine

## 2019-12-02 DIAGNOSIS — E119 Type 2 diabetes mellitus without complications: Secondary | ICD-10-CM

## 2019-12-08 ENCOUNTER — Other Ambulatory Visit: Payer: Self-pay

## 2019-12-08 ENCOUNTER — Ambulatory Visit (INDEPENDENT_AMBULATORY_CARE_PROVIDER_SITE_OTHER): Payer: BC Managed Care – PPO | Admitting: Sports Medicine

## 2019-12-08 DIAGNOSIS — L081 Erythrasma: Secondary | ICD-10-CM

## 2019-12-08 MED ORDER — CLOTRIMAZOLE-BETAMETHASONE 1-0.05 % EX CREA: 1.0000 "application " | TOPICAL_CREAM | Freq: Two times a day (BID) | CUTANEOUS | 0 refills | Status: DC

## 2019-12-08 NOTE — Assessment & Plan Note (Signed)
Rash is still present after clarithromycin. We initially thought erythrasma, because some of this may be intertrigo I am going to add topical clotrimazole/betamethasone twice daily. Return in a month.

## 2019-12-08 NOTE — Progress Notes (Signed)
    Procedures performed today:    None.  Independent interpretation of notes and tests performed by another provider:   None.  Impression and Recommendations:    Erythrasma Rash is still present after clarithromycin. We initially thought erythrasma, because some of this may be intertrigo I am going to add topical clotrimazole/betamethasone twice daily. Return in a month.    ___________________________________________ Ihor Austin. Benjamin Stain, M.D., ABFM., CAQSM. Primary Care and Sports Medicine [facility]  Adjunct Instructor of Family Medicine  [employer]

## 2019-12-28 ENCOUNTER — Other Ambulatory Visit: Payer: Self-pay | Admitting: Sports Medicine

## 2019-12-28 DIAGNOSIS — E119 Type 2 diabetes mellitus without complications: Secondary | ICD-10-CM

## 2020-01-25 ENCOUNTER — Other Ambulatory Visit: Payer: Self-pay | Admitting: Sports Medicine

## 2020-01-25 DIAGNOSIS — E119 Type 2 diabetes mellitus without complications: Secondary | ICD-10-CM

## 2020-02-23 ENCOUNTER — Other Ambulatory Visit: Payer: Self-pay | Admitting: Sports Medicine

## 2020-02-23 DIAGNOSIS — E119 Type 2 diabetes mellitus without complications: Secondary | ICD-10-CM

## 2020-02-24 ENCOUNTER — Ambulatory Visit (INDEPENDENT_AMBULATORY_CARE_PROVIDER_SITE_OTHER): Payer: BC Managed Care – PPO | Admitting: *Deleted

## 2020-02-24 DIAGNOSIS — I428 Other cardiomyopathies: Secondary | ICD-10-CM

## 2020-02-24 DIAGNOSIS — I5022 Chronic systolic (congestive) heart failure: Secondary | ICD-10-CM

## 2020-02-24 LAB — CUP PACEART REMOTE DEVICE CHECK
Battery Remaining Longevity: 19 mo
Battery Remaining Percentage: 24 %
Battery Voltage: 2.9 V
Brady Statistic AP VP Percent: 8.9 %
Brady Statistic AP VS Percent: 1 %
Brady Statistic AS VP Percent: 90 %
Brady Statistic AS VS Percent: 1 %
Brady Statistic RA Percent Paced: 8.2 %
Date Time Interrogation Session: 20210531035012
HighPow Impedance: 84 Ohm
HighPow Impedance: 84 Ohm
Implantable Lead Implant Date: 20160407
Implantable Lead Implant Date: 20160407
Implantable Lead Implant Date: 20160407
Implantable Lead Location: 753858
Implantable Lead Location: 753859
Implantable Lead Location: 753860
Implantable Lead Model: 7122
Implantable Pulse Generator Implant Date: 20160407
Lead Channel Impedance Value: 440 Ohm
Lead Channel Impedance Value: 480 Ohm
Lead Channel Impedance Value: 640 Ohm
Lead Channel Pacing Threshold Amplitude: 0.5 V
Lead Channel Pacing Threshold Amplitude: 0.5 V
Lead Channel Pacing Threshold Amplitude: 2.125 V
Lead Channel Pacing Threshold Pulse Width: 0.4 ms
Lead Channel Pacing Threshold Pulse Width: 0.4 ms
Lead Channel Pacing Threshold Pulse Width: 0.5 ms
Lead Channel Sensing Intrinsic Amplitude: 12 mV
Lead Channel Sensing Intrinsic Amplitude: 2.9 mV
Lead Channel Setting Pacing Amplitude: 2 V
Lead Channel Setting Pacing Amplitude: 2.5 V
Lead Channel Setting Pacing Amplitude: 2.5 V
Lead Channel Setting Pacing Pulse Width: 0.4 ms
Lead Channel Setting Pacing Pulse Width: 0.5 ms
Lead Channel Setting Sensing Sensitivity: 0.5 mV
Pulse Gen Serial Number: 7250204

## 2020-02-26 NOTE — Progress Notes (Signed)
Remote ICD transmission.   

## 2020-03-25 ENCOUNTER — Other Ambulatory Visit: Payer: Self-pay | Admitting: Nurse Practitioner

## 2020-03-25 DIAGNOSIS — E119 Type 2 diabetes mellitus without complications: Secondary | ICD-10-CM

## 2020-05-04 ENCOUNTER — Other Ambulatory Visit: Payer: Self-pay | Admitting: Nurse Practitioner

## 2020-05-04 DIAGNOSIS — E119 Type 2 diabetes mellitus without complications: Secondary | ICD-10-CM

## 2020-05-23 ENCOUNTER — Ambulatory Visit (INDEPENDENT_AMBULATORY_CARE_PROVIDER_SITE_OTHER): Payer: BC Managed Care – PPO | Admitting: *Deleted

## 2020-05-23 DIAGNOSIS — I428 Other cardiomyopathies: Secondary | ICD-10-CM

## 2020-05-24 LAB — CUP PACEART REMOTE DEVICE CHECK
Battery Remaining Longevity: 19 mo
Battery Remaining Percentage: 24 %
Battery Voltage: 2.9 V
Brady Statistic AP VP Percent: 8.2 %
Brady Statistic AP VS Percent: 1 %
Brady Statistic AS VP Percent: 90 %
Brady Statistic AS VS Percent: 1 %
Brady Statistic RA Percent Paced: 7.5 %
Date Time Interrogation Session: 20210830202219
HighPow Impedance: 83 Ohm
HighPow Impedance: 83 Ohm
Implantable Lead Implant Date: 20160407
Implantable Lead Implant Date: 20160407
Implantable Lead Implant Date: 20160407
Implantable Lead Location: 753858
Implantable Lead Location: 753859
Implantable Lead Location: 753860
Implantable Lead Model: 7122
Implantable Pulse Generator Implant Date: 20160407
Lead Channel Impedance Value: 440 Ohm
Lead Channel Impedance Value: 510 Ohm
Lead Channel Impedance Value: 650 Ohm
Lead Channel Pacing Threshold Amplitude: 0.5 V
Lead Channel Pacing Threshold Amplitude: 0.5 V
Lead Channel Pacing Threshold Amplitude: 1.75 V
Lead Channel Pacing Threshold Pulse Width: 0.4 ms
Lead Channel Pacing Threshold Pulse Width: 0.4 ms
Lead Channel Pacing Threshold Pulse Width: 0.5 ms
Lead Channel Sensing Intrinsic Amplitude: 12 mV
Lead Channel Sensing Intrinsic Amplitude: 2.4 mV
Lead Channel Setting Pacing Amplitude: 2 V
Lead Channel Setting Pacing Amplitude: 2.5 V
Lead Channel Setting Pacing Amplitude: 2.5 V
Lead Channel Setting Pacing Pulse Width: 0.4 ms
Lead Channel Setting Pacing Pulse Width: 0.5 ms
Lead Channel Setting Sensing Sensitivity: 0.5 mV
Pulse Gen Serial Number: 7250204

## 2020-05-25 NOTE — Progress Notes (Signed)
Remote ICD transmission.   

## 2020-05-25 NOTE — Addendum Note (Signed)
Addended by: Geralyn Flash D on: 05/25/2020 11:31 AM   Modules accepted: Level of Service

## 2020-05-28 ENCOUNTER — Other Ambulatory Visit: Payer: Self-pay | Admitting: Nurse Practitioner

## 2020-05-28 DIAGNOSIS — E119 Type 2 diabetes mellitus without complications: Secondary | ICD-10-CM

## 2020-06-25 ENCOUNTER — Other Ambulatory Visit: Payer: Self-pay | Admitting: Nurse Practitioner

## 2020-06-25 DIAGNOSIS — E119 Type 2 diabetes mellitus without complications: Secondary | ICD-10-CM

## 2020-07-10 ENCOUNTER — Other Ambulatory Visit: Payer: Self-pay | Admitting: Nurse Practitioner

## 2020-07-10 DIAGNOSIS — E119 Type 2 diabetes mellitus without complications: Secondary | ICD-10-CM

## 2020-08-22 ENCOUNTER — Ambulatory Visit (INDEPENDENT_AMBULATORY_CARE_PROVIDER_SITE_OTHER): Payer: BC Managed Care – PPO

## 2020-08-22 DIAGNOSIS — I428 Other cardiomyopathies: Secondary | ICD-10-CM

## 2020-08-22 LAB — CUP PACEART REMOTE DEVICE CHECK
Battery Remaining Longevity: 23 mo
Battery Remaining Percentage: 27 %
Battery Voltage: 2.87 V
Brady Statistic AP VP Percent: 8 %
Brady Statistic AP VS Percent: 1 %
Brady Statistic AS VP Percent: 91 %
Brady Statistic AS VS Percent: 1 %
Brady Statistic RA Percent Paced: 7.3 %
Date Time Interrogation Session: 20211129020020
HighPow Impedance: 84 Ohm
HighPow Impedance: 84 Ohm
Implantable Lead Implant Date: 20160407
Implantable Lead Implant Date: 20160407
Implantable Lead Implant Date: 20160407
Implantable Lead Location: 753858
Implantable Lead Location: 753859
Implantable Lead Location: 753860
Implantable Lead Model: 7122
Implantable Pulse Generator Implant Date: 20160407
Lead Channel Impedance Value: 440 Ohm
Lead Channel Impedance Value: 510 Ohm
Lead Channel Impedance Value: 660 Ohm
Lead Channel Pacing Threshold Amplitude: 0.5 V
Lead Channel Pacing Threshold Amplitude: 0.5 V
Lead Channel Pacing Threshold Amplitude: 1.875 V
Lead Channel Pacing Threshold Pulse Width: 0.4 ms
Lead Channel Pacing Threshold Pulse Width: 0.4 ms
Lead Channel Pacing Threshold Pulse Width: 0.5 ms
Lead Channel Sensing Intrinsic Amplitude: 1 mV
Lead Channel Sensing Intrinsic Amplitude: 12 mV
Lead Channel Setting Pacing Amplitude: 2 V
Lead Channel Setting Pacing Amplitude: 2.5 V
Lead Channel Setting Pacing Amplitude: 2.5 V
Lead Channel Setting Pacing Pulse Width: 0.4 ms
Lead Channel Setting Pacing Pulse Width: 0.5 ms
Lead Channel Setting Sensing Sensitivity: 0.5 mV
Pulse Gen Serial Number: 7250204

## 2020-08-26 NOTE — Progress Notes (Signed)
Remote ICD transmission.   

## 2020-11-06 ENCOUNTER — Other Ambulatory Visit: Payer: Self-pay | Admitting: Sports Medicine

## 2020-11-06 ENCOUNTER — Other Ambulatory Visit: Payer: Self-pay | Admitting: Nurse Practitioner

## 2020-11-06 DIAGNOSIS — E119 Type 2 diabetes mellitus without complications: Secondary | ICD-10-CM

## 2020-11-19 ENCOUNTER — Other Ambulatory Visit: Payer: Self-pay | Admitting: Sports Medicine

## 2020-11-19 DIAGNOSIS — E119 Type 2 diabetes mellitus without complications: Secondary | ICD-10-CM

## 2020-11-21 ENCOUNTER — Ambulatory Visit (INDEPENDENT_AMBULATORY_CARE_PROVIDER_SITE_OTHER): Payer: BC Managed Care – PPO

## 2020-11-21 DIAGNOSIS — I428 Other cardiomyopathies: Secondary | ICD-10-CM

## 2020-11-23 LAB — CUP PACEART REMOTE DEVICE CHECK
Battery Remaining Longevity: 23 mo
Battery Remaining Percentage: 28 %
Battery Voltage: 2.84 V
Brady Statistic AP VP Percent: 7.8 %
Brady Statistic AP VS Percent: 1 %
Brady Statistic AS VP Percent: 91 %
Brady Statistic AS VS Percent: 1 %
Brady Statistic RA Percent Paced: 7.2 %
Date Time Interrogation Session: 20220228020041
HighPow Impedance: 77 Ohm
HighPow Impedance: 77 Ohm
Implantable Lead Implant Date: 20160407
Implantable Lead Implant Date: 20160407
Implantable Lead Implant Date: 20160407
Implantable Lead Location: 753858
Implantable Lead Location: 753859
Implantable Lead Location: 753860
Implantable Lead Model: 7122
Implantable Pulse Generator Implant Date: 20160407
Lead Channel Impedance Value: 410 Ohm
Lead Channel Impedance Value: 430 Ohm
Lead Channel Impedance Value: 630 Ohm
Lead Channel Pacing Threshold Amplitude: 0.5 V
Lead Channel Pacing Threshold Amplitude: 0.5 V
Lead Channel Pacing Threshold Amplitude: 1.875 V
Lead Channel Pacing Threshold Pulse Width: 0.4 ms
Lead Channel Pacing Threshold Pulse Width: 0.4 ms
Lead Channel Pacing Threshold Pulse Width: 0.5 ms
Lead Channel Sensing Intrinsic Amplitude: 12 mV
Lead Channel Sensing Intrinsic Amplitude: 2 mV
Lead Channel Setting Pacing Amplitude: 2 V
Lead Channel Setting Pacing Amplitude: 2.5 V
Lead Channel Setting Pacing Amplitude: 2.5 V
Lead Channel Setting Pacing Pulse Width: 0.4 ms
Lead Channel Setting Pacing Pulse Width: 0.5 ms
Lead Channel Setting Sensing Sensitivity: 0.5 mV
Pulse Gen Serial Number: 7250204

## 2020-11-29 NOTE — Progress Notes (Signed)
Remote ICD transmission.   

## 2021-04-05 ENCOUNTER — Other Ambulatory Visit: Payer: Self-pay | Admitting: Family Medicine

## 2021-04-05 DIAGNOSIS — E119 Type 2 diabetes mellitus without complications: Secondary | ICD-10-CM

## 2021-04-05 MED ORDER — LEVOTHYROXINE SODIUM 50 MCG PO TABS: ORAL_TABLET | ORAL | 0 refills | Status: DC

## 2021-04-05 MED ORDER — SPIRONOLACTONE 25 MG PO TABS: 25.0000 mg | ORAL_TABLET | Freq: Two times a day (BID) | ORAL | 0 refills | Status: DC

## 2021-04-05 MED ORDER — FUROSEMIDE 40 MG PO TABS: ORAL_TABLET | ORAL | 0 refills | Status: DC

## 2021-04-05 MED ORDER — ASPIRIN EC 81 MG PO TBEC: 81.0000 mg | DELAYED_RELEASE_TABLET | Freq: Every day | ORAL | 0 refills | Status: DC

## 2021-04-05 MED ORDER — CARVEDILOL 25 MG PO TABS: 25.0000 mg | ORAL_TABLET | Freq: Two times a day (BID) | ORAL | 0 refills | Status: DC

## 2021-04-05 MED ORDER — AMLODIPINE BESYLATE 10 MG PO TABS: 10.0000 mg | ORAL_TABLET | Freq: Every day | ORAL | 0 refills | Status: DC

## 2021-04-05 MED ORDER — GLIPIZIDE ER 10 MG PO TB24: ORAL_TABLET | ORAL | 0 refills | Status: DC

## 2021-04-05 MED ORDER — ALLOPURINOL 300 MG PO TABS: 300.0000 mg | ORAL_TABLET | Freq: Two times a day (BID) | ORAL | 0 refills | Status: DC

## 2021-04-05 MED ORDER — METFORMIN HCL 1000 MG PO TABS: 1000.0000 mg | ORAL_TABLET | Freq: Two times a day (BID) | ORAL | 0 refills | Status: DC

## 2021-04-05 MED ORDER — LISINOPRIL 2.5 MG PO TABS: 2.5000 mg | ORAL_TABLET | Freq: Every day | ORAL | 0 refills | Status: DC

## 2021-06-22 ENCOUNTER — Encounter: Payer: Self-pay | Admitting: Internal Medicine

## 2021-06-27 ENCOUNTER — Other Ambulatory Visit: Payer: Self-pay | Admitting: Family Medicine

## 2021-06-27 DIAGNOSIS — E119 Type 2 diabetes mellitus without complications: Secondary | ICD-10-CM

## 2021-06-28 NOTE — Telephone Encounter (Signed)
Please call patient to schedule follow-up/annual appt with Dr. Karie Schwalbe. Sending 30 day refills on meds. Not seen since March 2021

## 2021-06-28 NOTE — Telephone Encounter (Signed)
LVM informing pt of 30 day refill and that he will need to have a follow up for any further refills

## 2021-07-14 ENCOUNTER — Ambulatory Visit (INDEPENDENT_AMBULATORY_CARE_PROVIDER_SITE_OTHER): Payer: Managed Care, Other (non HMO)

## 2021-07-14 ENCOUNTER — Ambulatory Visit: Payer: Managed Care, Other (non HMO) | Admitting: Sports Medicine

## 2021-07-14 ENCOUNTER — Encounter: Payer: Self-pay | Admitting: Sports Medicine

## 2021-07-14 ENCOUNTER — Other Ambulatory Visit: Payer: Self-pay

## 2021-07-14 VITALS — BP 119/83 | HR 75 | Ht 70.0 in | Wt 289.1 lb

## 2021-07-14 DIAGNOSIS — M1A9XX0 Chronic gout, unspecified, without tophus (tophi): Secondary | ICD-10-CM | POA: Diagnosis not present

## 2021-07-14 DIAGNOSIS — M545 Low back pain, unspecified: Secondary | ICD-10-CM | POA: Diagnosis not present

## 2021-07-14 DIAGNOSIS — M5136 Other intervertebral disc degeneration, lumbar region: Secondary | ICD-10-CM | POA: Diagnosis not present

## 2021-07-14 DIAGNOSIS — E119 Type 2 diabetes mellitus without complications: Secondary | ICD-10-CM

## 2021-07-14 DIAGNOSIS — G4762 Sleep related leg cramps: Secondary | ICD-10-CM

## 2021-07-14 DIAGNOSIS — Z1211 Encounter for screening for malignant neoplasm of colon: Secondary | ICD-10-CM | POA: Diagnosis not present

## 2021-07-14 DIAGNOSIS — M51369 Other intervertebral disc degeneration, lumbar region without mention of lumbar back pain or lower extremity pain: Secondary | ICD-10-CM | POA: Insufficient documentation

## 2021-07-14 MED ORDER — MAGNESIUM OXIDE 400 MG PO TABS: 800.0000 mg | ORAL_TABLET | Freq: Every day | ORAL | 3 refills | Status: AC

## 2021-07-14 MED ORDER — INVOKAMET XR 150-1000 MG PO TB24: 1.0000 | ORAL_TABLET | Freq: Every day | ORAL | 11 refills | Status: DC

## 2021-07-14 MED ORDER — CARVEDILOL 25 MG PO TABS: 25.0000 mg | ORAL_TABLET | Freq: Two times a day (BID) | ORAL | 3 refills | Status: DC

## 2021-07-14 MED ORDER — GLIPIZIDE ER 10 MG PO TB24: ORAL_TABLET | ORAL | 3 refills | Status: DC

## 2021-07-14 NOTE — Assessment & Plan Note (Signed)
Had an episode of fairly severe axial low back pain, nothing radicular, no red flag symptoms. Worse with sitting, flexion, Valsalva. We discussed the anatomy, pathophysiology of low back pain, adding home physical therapy, x-rays. Return to see me in 6 weeks, MRI for interventional planning if not sufficiently better.

## 2021-07-14 NOTE — Assessment & Plan Note (Signed)
Restarting magnesium oxide, this worked well in the past.

## 2021-07-14 NOTE — Progress Notes (Addendum)
    Procedures performed today:    None.  Independent interpretation of notes and tests performed by another provider:   None.  Brief History, Exam, Impression, and Recommendations:    Controlled type 2 diabetes mellitus without complication, without long-term current use of insulin (HCC) Rechecking labs, adding invokamet, discontinue standard release metformin, having excessive loose stools.  Update: Not surprisingly Invokamet not covered like it has been previously as insurance companies tend to rotate their coverage once patient's get controlled on a certain medication, they are telling me that Synjardy XR is covered, we will do maximum dose.  Lumbar degenerative disc disease Had an episode of fairly severe axial low back pain, nothing radicular, no red flag symptoms. Worse with sitting, flexion, Valsalva. We discussed the anatomy, pathophysiology of low back pain, adding home physical therapy, x-rays. Return to see me in 6 weeks, MRI for interventional planning if not sufficiently better.  Nocturnal leg cramps Restarting magnesium oxide, this worked well in the past.    ___________________________________________ Ihor Austin. Benjamin Stain, M.D., ABFM., CAQSM. Primary Care and Sports Medicine [place] [facility]  Adjunct Instructor of Family Medicine  [employer] [facility] [facility] [employer]

## 2021-07-14 NOTE — Assessment & Plan Note (Addendum)
Rechecking labs, adding invokamet, discontinue standard release metformin, having excessive loose stools.  Update: Not surprisingly Invokamet not covered like it has been previously as insurance companies tend to rotate their coverage once patient's get controlled on a certain medication, they are telling me that Synjardy XR is covered, we will do maximum dose.

## 2021-07-18 ENCOUNTER — Other Ambulatory Visit: Payer: Self-pay

## 2021-07-18 DIAGNOSIS — E119 Type 2 diabetes mellitus without complications: Secondary | ICD-10-CM

## 2021-07-18 MED ORDER — INVOKAMET XR 150-1000 MG PO TB24: 1.0000 | ORAL_TABLET | Freq: Every day | ORAL | 4 refills | Status: DC

## 2021-07-20 LAB — CBC
HCT: 43.6 % (ref 38.5–50.0)
Hemoglobin: 14.8 g/dL (ref 13.2–17.1)
MCH: 30.8 pg (ref 27.0–33.0)
MCHC: 33.9 g/dL (ref 32.0–36.0)
MCV: 90.8 fL (ref 80.0–100.0)
MPV: 11.4 fL (ref 7.5–12.5)
Platelets: 187 10*3/uL (ref 140–400)
RBC: 4.8 10*6/uL (ref 4.20–5.80)
RDW: 13 % (ref 11.0–15.0)
WBC: 7.7 10*3/uL (ref 3.8–10.8)

## 2021-07-20 LAB — COMPREHENSIVE METABOLIC PANEL
AG Ratio: 1.4 (calc) (ref 1.0–2.5)
ALT: 46 U/L (ref 9–46)
AST: 32 U/L (ref 10–35)
Albumin: 4.2 g/dL (ref 3.6–5.1)
Alkaline phosphatase (APISO): 55 U/L (ref 35–144)
BUN: 15 mg/dL (ref 7–25)
CO2: 23 mmol/L (ref 20–32)
Calcium: 9.4 mg/dL (ref 8.6–10.3)
Chloride: 98 mmol/L (ref 98–110)
Creat: 0.91 mg/dL (ref 0.70–1.30)
Globulin: 3 g/dL (calc) (ref 1.9–3.7)
Glucose, Bld: 267 mg/dL — ABNORMAL HIGH (ref 65–99)
Potassium: 4.7 mmol/L (ref 3.5–5.3)
Sodium: 134 mmol/L — ABNORMAL LOW (ref 135–146)
Total Bilirubin: 0.9 mg/dL (ref 0.2–1.2)
Total Protein: 7.2 g/dL (ref 6.1–8.1)

## 2021-07-20 LAB — LIPID PANEL
Cholesterol: 164 mg/dL (ref <200)
HDL: 45 mg/dL (ref >=40)
LDL Cholesterol (Calc): 97 mg/dL (calc)
Non-HDL Cholesterol (Calc): 119 mg/dL (calc) (ref <130)
Total CHOL/HDL Ratio: 3.6 (calc) (ref <5.0)
Triglycerides: 119 mg/dL (ref <150)

## 2021-07-20 LAB — HEMOGLOBIN A1C
Hgb A1c MFr Bld: 8.4 % of total Hgb — ABNORMAL HIGH (ref <5.7)
Mean Plasma Glucose: 194 mg/dL
eAG (mmol/L): 10.8 mmol/L

## 2021-07-20 LAB — TSH: TSH: 2.33 mIU/L (ref 0.40–4.50)

## 2021-07-20 LAB — URIC ACID: Uric Acid, Serum: 4.6 mg/dL (ref 4.0–8.0)

## 2021-07-21 MED ORDER — SYNJARDY XR 25-1000 MG PO TB24: 1.0000 | ORAL_TABLET | Freq: Every day | ORAL | 3 refills | Status: DC

## 2021-07-21 NOTE — Addendum Note (Signed)
Addended by: Monica Becton on: 07/21/2021 01:13 PM   Modules accepted: Orders

## 2021-08-02 LAB — HM DIABETES EYE EXAM

## 2021-08-04 ENCOUNTER — Encounter: Payer: Self-pay | Admitting: Sports Medicine

## 2021-08-05 ENCOUNTER — Other Ambulatory Visit: Payer: Self-pay | Admitting: Family Medicine

## 2021-08-21 ENCOUNTER — Other Ambulatory Visit: Payer: Self-pay | Admitting: Family Medicine

## 2021-08-25 ENCOUNTER — Other Ambulatory Visit: Payer: Self-pay

## 2021-08-25 ENCOUNTER — Ambulatory Visit: Payer: Managed Care, Other (non HMO) | Admitting: Sports Medicine

## 2021-08-25 DIAGNOSIS — M51369 Other intervertebral disc degeneration, lumbar region without mention of lumbar back pain or lower extremity pain: Secondary | ICD-10-CM

## 2021-08-25 DIAGNOSIS — H01004 Unspecified blepharitis left upper eyelid: Secondary | ICD-10-CM | POA: Diagnosis not present

## 2021-08-25 DIAGNOSIS — E119 Type 2 diabetes mellitus without complications: Secondary | ICD-10-CM | POA: Diagnosis not present

## 2021-08-25 DIAGNOSIS — M5136 Other intervertebral disc degeneration, lumbar region: Secondary | ICD-10-CM

## 2021-08-25 MED ORDER — TRIAMCINOLONE ACETONIDE 0.025 % EX CREA: 1.0000 "application " | TOPICAL_CREAM | Freq: Two times a day (BID) | CUTANEOUS | 0 refills | Status: DC

## 2021-08-25 NOTE — Progress Notes (Signed)
    Procedures performed today:    None.  Independent interpretation of notes and tests performed by another provider:   None.  Brief History, Exam, Impression, and Recommendations:    Lumbar degenerative disc disease Lumbar DDD much better with home physical therapy. Return to see me as needed for this.  Blepharitis of left upper eyelid Andre [name] has history of eczema, he has developed a scaly reddish rash on the left upper eyelid, does not interfere with vision. Will add a low potency steroid for what appears to be a simple blepharitis/eczema.  Controlled type 2 diabetes mellitus without complication, without long-term current use of insulin (HCC) We discontinued his extended release metformin and loose stools improved, Invokamet also was no longer covered so we switched to Synjardy XR, we do need to recheck his hemoglobin A1c middle of January.  Chronic process with exacerbation and pharmacologic intervention.   ___________________________________________ Andre [name]. Benjamin Stain, M.D., ABFM., CAQSM. Primary Care and Sports Medicine [place] [facility]  Adjunct Instructor of Family Medicine  [employer]

## 2021-08-25 NOTE — Assessment & Plan Note (Signed)
Andre [name] has history of eczema, he has developed a scaly reddish rash on the left upper eyelid, does not interfere with vision. Will add a low potency steroid for what appears to be a simple blepharitis/eczema.

## 2021-08-25 NOTE — Assessment & Plan Note (Signed)
Lumbar DDD much better with home physical therapy. Return to see me as needed for this.

## 2021-08-25 NOTE — Assessment & Plan Note (Signed)
We discontinued his extended release metformin and loose stools improved, Invokamet also was no longer covered so we switched to Synjardy XR, we do need to recheck his hemoglobin A1c middle of January.

## 2021-09-16 ENCOUNTER — Other Ambulatory Visit: Payer: Self-pay | Admitting: Family Medicine

## 2021-10-11 ENCOUNTER — Ambulatory Visit: Payer: Managed Care, Other (non HMO) | Admitting: Sports Medicine

## 2021-10-11 ENCOUNTER — Other Ambulatory Visit: Payer: Self-pay

## 2021-10-11 VITALS — BP 124/83 | HR 73 | Wt 283.1 lb

## 2021-10-11 DIAGNOSIS — E119 Type 2 diabetes mellitus without complications: Secondary | ICD-10-CM | POA: Diagnosis not present

## 2021-10-11 LAB — POCT GLYCOSYLATED HEMOGLOBIN (HGB A1C): Hemoglobin A1C: 8.6 % — AB (ref 4.0–5.6)

## 2021-10-11 MED ORDER — OZEMPIC (0.25 OR 0.5 MG/DOSE) 2 MG/1.5ML ~~LOC~~ SOPN: PEN_INJECTOR | SUBCUTANEOUS | 1 refills | Status: DC

## 2021-10-11 NOTE — Progress Notes (Addendum)
° ° °  Procedures performed today:    None.  Independent interpretation of notes and tests performed by another provider:   None.  Brief History, Exam, Impression, and Recommendations:    Controlled type 2 diabetes mellitus without complication, without long-term current use of insulin (HCC) Andre [name] returns, he is a very pleasant 56 year old male, historically controlled type 2 diabetes, was having excessive diarrhea on metformin standard release so we switched him to [place] with extended release and the diarrhea has resolved, unfortunately he has blood sugars have worsened, A1c up to 8.6%. He did have some difficulty with GLP-1's in the past, we will try Ozempic this time at a low dose, if he can tolerate it I like to see him back in 3 months.  Update: Ozempic on backorder, switching to Rybelsus.  If he is able to get it I will send in the titration doses for the second and third months.    ___________________________________________ Andre [name]. Benjamin Stain, M.D., ABFM., CAQSM. Primary Care and Sports Medicine [place] [facility]  Adjunct Instructor of Family Medicine  [employer]

## 2021-10-11 NOTE — Assessment & Plan Note (Addendum)
Andre [name] returns, he is a very pleasant 56 year old male, historically controlled type 2 diabetes, was having excessive diarrhea on metformin standard release so we switched him to [place] with extended release and the diarrhea has resolved, unfortunately he has blood sugars have worsened, A1c up to 8.6%. He did have some difficulty with GLP-1's in the past, we will try Ozempic this time at a low dose, if he can tolerate it I like to see him back in 3 months.  Update: Ozempic on backorder, switching to Rybelsus.  If he is able to get it I will send in the titration doses for the second and third months.

## 2021-10-15 ENCOUNTER — Other Ambulatory Visit: Payer: Self-pay | Admitting: Sports Medicine

## 2021-10-17 ENCOUNTER — Other Ambulatory Visit: Payer: Self-pay

## 2021-10-17 DIAGNOSIS — E119 Type 2 diabetes mellitus without complications: Secondary | ICD-10-CM

## 2021-10-17 MED ORDER — SYNJARDY XR 25-1000 MG PO TB24: 1.0000 | ORAL_TABLET | Freq: Every day | ORAL | 3 refills | Status: DC

## 2021-10-22 ENCOUNTER — Other Ambulatory Visit: Payer: Self-pay | Admitting: Sports Medicine

## 2021-10-22 DIAGNOSIS — E119 Type 2 diabetes mellitus without complications: Secondary | ICD-10-CM

## 2021-10-24 MED ORDER — RYBELSUS 3 MG PO TABS: 3.0000 mg | ORAL_TABLET | Freq: Every day | ORAL | 0 refills | Status: DC

## 2021-10-24 NOTE — Addendum Note (Signed)
Addended by: Monica Becton on: 10/24/2021 11:29 AM   Modules accepted: Orders

## 2021-11-20 ENCOUNTER — Telehealth: Payer: Self-pay

## 2021-11-20 ENCOUNTER — Ambulatory Visit (INDEPENDENT_AMBULATORY_CARE_PROVIDER_SITE_OTHER): Payer: Managed Care, Other (non HMO)

## 2021-11-20 DIAGNOSIS — E119 Type 2 diabetes mellitus without complications: Secondary | ICD-10-CM

## 2021-11-20 DIAGNOSIS — I428 Other cardiomyopathies: Secondary | ICD-10-CM

## 2021-11-20 MED ORDER — RYBELSUS 7 MG PO TABS: 7.0000 mg | ORAL_TABLET | Freq: Every day | ORAL | 3 refills | Status: DC

## 2021-11-20 NOTE — Telephone Encounter (Signed)
Absolutely, new dose called in.

## 2021-11-20 NOTE — Telephone Encounter (Signed)
Andre [name] called and states he needs an increased dose of Rybelsus. Please advise.

## 2021-11-27 NOTE — Progress Notes (Signed)
Remote ICD transmission.   

## 2021-12-11 ENCOUNTER — Other Ambulatory Visit: Payer: Self-pay

## 2021-12-11 DIAGNOSIS — E119 Type 2 diabetes mellitus without complications: Secondary | ICD-10-CM

## 2021-12-11 MED ORDER — RYBELSUS 7 MG PO TABS: 7.0000 mg | ORAL_TABLET | Freq: Every day | ORAL | 3 refills | Status: DC

## 2022-01-09 ENCOUNTER — Ambulatory Visit: Payer: Managed Care, Other (non HMO) | Admitting: Sports Medicine

## 2022-01-12 ENCOUNTER — Ambulatory Visit: Payer: Managed Care, Other (non HMO) | Admitting: Sports Medicine

## 2022-01-12 VITALS — BP 113/77 | HR 74

## 2022-01-12 DIAGNOSIS — E119 Type 2 diabetes mellitus without complications: Secondary | ICD-10-CM | POA: Diagnosis not present

## 2022-01-12 DIAGNOSIS — Z0000 Encounter for general adult medical examination without abnormal findings: Secondary | ICD-10-CM

## 2022-01-12 DIAGNOSIS — Z23 Encounter for immunization: Secondary | ICD-10-CM

## 2022-01-12 LAB — POCT GLYCOSYLATED HEMOGLOBIN (HGB A1C): Hemoglobin A1C: 7.2 % — AB (ref 4.0–5.6)

## 2022-01-12 MED ORDER — RYBELSUS 14 MG PO TABS: 14.0000 mg | ORAL_TABLET | Freq: Every day | ORAL | 3 refills | Status: DC

## 2022-01-12 NOTE — Progress Notes (Signed)
? ? ?  Procedures performed today:   ? ?None. ? ?Independent interpretation of notes and tests performed by another provider:  ? ?None. ? ?Brief History, Exam, Impression, and Recommendations:   ? ?Controlled type 2 diabetes mellitus without complication, without long-term current use of insulin ([place]) ?Andre [name] returns, he is a very pleasant 56 year old male, historically controlled type 2 diabetes, he had excessive diarrhea on metformin standard release so we switched him to [place], diarrhea resolved with the blood sugars worsened with an A1c up to 8.6% 3 months ago, he was also unable to tolerate Ozempic so we switched to Rybelsus, recently increased to 7 mg and he returns today with an A1c of 7.2% and a 13 pound weight loss. ?Feels good, peripheral neuropathic symptoms are resolving, increasing Rybelsus up to 14 mg, recheck weight and A1c in 3 months. ? ?Annual physical exam ?Patient has Cologuard but has not done the test yet, he agrees to do it ASAP, Shingrix #1 today, he will come back in 3 months for repeat A1c at which point we can do Shingrix #2. ? ?Chronic process with exacerbation and pharmacologic intervention ? ?___________________________________________ ?Gwen Her. Dianah Field, M.D., ABFM., CAQSM. ?Primary Care and Sports Medicine ?[place] ? ?Adjunct Instructor of Family Medicine  ?[employer] ?

## 2022-01-12 NOTE — Assessment & Plan Note (Signed)
Patient has Cologuard but has not done the test yet, he agrees to do it ASAP, Shingrix #1 today, he will come back in 3 months for repeat A1c at which point we can do Shingrix #2. ?

## 2022-01-12 NOTE — Addendum Note (Signed)
Addended by: Dema Severin on: 01/12/2022 10:06 AM ? ? Modules accepted: Orders ? ?

## 2022-01-12 NOTE — Assessment & Plan Note (Signed)
Andre [name] returns, he is a very pleasant 56 year old male, historically controlled type 2 diabetes, he had excessive diarrhea on metformin standard release so we switched him to [place], diarrhea resolved with the blood sugars worsened with an A1c up to 8.6% 3 months ago, he was also unable to tolerate Ozempic so we switched to Rybelsus, recently increased to 7 mg and he returns today with an A1c of 7.2% and a 13 pound weight loss. ?Feels good, peripheral neuropathic symptoms are resolving, increasing Rybelsus up to 14 mg, recheck weight and A1c in 3 months. ?

## 2022-02-06 ENCOUNTER — Other Ambulatory Visit: Payer: Self-pay

## 2022-02-06 DIAGNOSIS — E119 Type 2 diabetes mellitus without complications: Secondary | ICD-10-CM

## 2022-02-06 MED ORDER — RYBELSUS 14 MG PO TABS: 14.0000 mg | ORAL_TABLET | Freq: Every day | ORAL | 3 refills | Status: DC

## 2022-03-02 ENCOUNTER — Telehealth: Payer: Self-pay

## 2022-03-02 NOTE — Telephone Encounter (Signed)
I spoke with the patient and he agreed to come on 03-09-2022 at 10:55 am.

## 2022-03-02 NOTE — Telephone Encounter (Signed)
LMOVM the patient needs to be on device clinic or app schedule. RF Telemetry is off. [facility] rep needs to be present for appointment.

## 2022-03-09 ENCOUNTER — Ambulatory Visit: Payer: Managed Care, Other (non HMO) | Admitting: Physician Assistant

## 2022-03-09 ENCOUNTER — Encounter: Payer: Self-pay | Admitting: Physician Assistant

## 2022-03-09 ENCOUNTER — Ambulatory Visit (INDEPENDENT_AMBULATORY_CARE_PROVIDER_SITE_OTHER): Payer: Managed Care, Other (non HMO)

## 2022-03-09 VITALS — BP 100/62 | HR 72 | Ht 70.0 in | Wt 269.0 lb

## 2022-03-09 DIAGNOSIS — Z95810 Presence of automatic (implantable) cardiac defibrillator: Secondary | ICD-10-CM

## 2022-03-09 DIAGNOSIS — I428 Other cardiomyopathies: Secondary | ICD-10-CM

## 2022-03-09 DIAGNOSIS — Z79899 Other long term (current) drug therapy: Secondary | ICD-10-CM

## 2022-03-09 DIAGNOSIS — I10 Essential (primary) hypertension: Secondary | ICD-10-CM

## 2022-03-09 MED ORDER — LOSARTAN POTASSIUM 25 MG PO TABS: 25.0000 mg | ORAL_TABLET | Freq: Every day | ORAL | 2 refills | Status: DC

## 2022-03-09 MED ORDER — AMLODIPINE BESYLATE 5 MG PO TABS: 5.0000 mg | ORAL_TABLET | Freq: Every day | ORAL | 1 refills | Status: DC

## 2022-03-09 NOTE — Patient Instructions (Addendum)
Medication Instructions:     START TAKING:  LOSARTAN 25 MG ONCE A DAY   STOP TAKING AND REMOVE THIS MEDICATION FROM YOUR MEDICATION LIST:  AMLODIPINE 5 MG AND LISINOPRIL 2.5 MG    *If you need a refill on your cardiac medications before your next appointment, please call your pharmacy*   Lab Work:  BMET TODAY    If you have labs (blood work) drawn today and your tests are completely normal, you will receive your results only by: MyChart Message (if you have MyChart) OR A paper copy in the mail If you have any lab test that is abnormal or we need to change your treatment, we will call you to review the results.   Testing/Procedures: Your physician has requested that you have an echocardiogram. Echocardiography is a painless test that uses sound waves to create images of your heart. It provides your doctor with information about the size and shape of your heart and how well your heart's chambers and valves are working. This procedure takes approximately one hour. There are no restrictions for this procedure.    Follow-Up: At [facility], you and your health needs are our priority.  As part of our continuing mission to provide you with exceptional heart care, we have created designated Provider Care Teams.  These Care Teams include your primary Cardiologist (physician) and Advanced Practice Providers (APPs -  Physician Assistants and Nurse Practitioners) who all work together to provide you with the care you need, when you need it.  We recommend signing up for the patient portal called "MyChart".  Sign up information is provided on this After Visit Summary.  MyChart is used to connect with patients for Virtual Visits (Telemedicine).  Patients are able to view lab/test results, encounter notes, upcoming appointments, etc.  Non-urgent messages can be sent to your provider as well.   To learn more about what you can do with MyChart, go to [email or web address].    Your next appointment:    6 month(s)  The format for your next appointment:   In Person  Provider:   Lewayne Bunting, MD    Other Instructions   Important Information About Sugar

## 2022-03-09 NOTE — Progress Notes (Signed)
Cardiology Office Note Date:  03/09/2022  Patient ID:  Andre Fels., DOB 1966/09/06, MRN [id number] PCP:  Monica Becton, MD  Electrophysiologist: Dr. Ladona Ridgel     Chief Complaint:  RF telemetry issue  History of Present Illness: Andre Perault. is a 56 y.o. male with history of LBBB, NICM, Chronic CHF, ICD, DM, HTN  He comes in today to be seen for Dr. Ladona Ridgel, last visit by him was 2019, at that time it had a 3 year hiatus from the clinic, doing ok. Class II symptoms. LV lead thresholds were up, device programmed accordingly, no other changes made.  Looks like he has been following with PMD.  He comes in today to be seen by industry for RF issue.  TODAY He comes accompanied by his wife He feels quite good No CP, palpitations No SOB at rest or w/ADLs, will get winded with heavier activities He works Holiday representative, a Lobbyist, no specific exercise. No near syncope or syncope.  Mentions he had not been in 2/2 not being told to. Has been seeing his PMD regularly    Device information [employer], CRT-D implanted 12/30/2014   Past Medical History:  Diagnosis Date   CHF (congestive heart failure) (HCC)    Diabetes mellitus without complication (HCC)    Gout    Hypertension    LBBB (left bundle branch block)    Non-ischemic cardiomyopathy (HCC)    a. s/p STJ CRTD   PVC (premature ventricular contraction)     Past Surgical History:  Procedure Laterality Date   HIP SURGERY     IMPLANTABLE CARDIOVERTER DEFIBRILLATOR IMPLANT N/A 12/30/2014   STJ CRTD implanted by Dr Ladona Ridgel   WRIST SURGERY      Current Outpatient Medications  Medication Sig Dispense Refill   allopurinol (ZYLOPRIM) 300 MG tablet TAKE 1 TABLET TWICE A DAY 60 tablet 11   AMBULATORY NON FORMULARY MEDICATION Single glucometer with lancets, test strips, testing 3 times daily 1 each 0   amLODipine (NORVASC) 10 MG tablet TAKE 1 TABLET DAILY 30 tablet 11   ASPIRIN LOW DOSE 81 MG EC tablet  TAKE 1 TABLET DAILY 30 tablet 11   carvedilol (COREG) 25 MG tablet Take 1 tablet (25 mg total) by mouth 2 (two) times daily with a meal. 180 tablet 3   clotrimazole-betamethasone (LOTRISONE) cream Apply 1 application topically 2 (two) times daily. 45 g 0   Continuous Blood Gluc Receiver (DEXCOM G6 RECEIVER) DEVI 1 each by Does not apply route daily. 1 Device 11   Continuous Blood Gluc Transmit (DEXCOM G6 TRANSMITTER) MISC 1 each by Does not apply route daily. 1 each 11   Empagliflozin-metFORMIN HCl ER (SYNJARDY XR) 25-1000 MG TB24 Take 1 tablet by mouth daily. 90 tablet 3   furosemide (LASIX) 40 MG tablet TAKE ONE TABLET BY MOUTH TWICE A WEEK ON MONDAYS AND THURSDAYS 90 tablet 0   glipiZIDE (GLUCOTROL XL) 10 MG 24 hr tablet TAKE 2 TABLETS DAILY WITH BREAKFAST 180 tablet 3   levothyroxine (SYNTHROID) 50 MCG tablet TAKE 1 TABLET DAILY BEFORE BREAKFAST 90 tablet 1   lisinopril (ZESTRIL) 2.5 MG tablet TAKE 1 TABLET DAILY 30 tablet 11   magnesium oxide (MAG-OX) 400 MG tablet Take 2 tablets (800 mg total) by mouth at bedtime. 180 tablet 3   Semaglutide (RYBELSUS) 14 MG TABS Take 1 tablet (14 mg total) by mouth daily. 90 tablet 3   spironolactone (ALDACTONE) 25 MG tablet TAKE 1 TABLET TWICE A DAY  60 tablet 11   triamcinolone (KENALOG) 0.025 % cream Apply 1 application topically 2 (two) times daily. To left upper eyelid 30 g 0   No current facility-administered medications for this visit.    Allergies:   Patient has no known allergies.   Social History:  The patient  reports that he has never smoked. He has never used smokeless tobacco. He reports current alcohol use. He reports that he does not use drugs.   Family History:  The patient's family history includes Alcohol abuse in his maternal grandfather and maternal grandmother; Depression in his sister; Heart attack in his father; Hypertension in his father.  ROS:  Please see the history of present illness.    All other systems are reviewed and  otherwise negative.   PHYSICAL EXAM:  VS:  There were no vitals taken for this visit. BMI: There is no height or weight on file to calculate BMI. Well nourished, well developed, in no acute distress HEENT: normocephalic, atraumatic Neck: no JVD, carotid bruits or masses Cardiac:   RRR; no significant murmurs, no rubs, or gallops Lungs:   CTA b/l, no wheezing, rhonchi or rales Abd: soft, nontender MS: no deformity or atrophy Ext: no edema Skin: warm and dry, no rash Neuro:  No gross deficits appreciated Psych: euthymic mood, full affect  ICD site is stable, no tethering or discomfort   EKG:  Done today and reviewed by myself shows  SR/V paced, 72bpm, morphology looks OK, a little long QR 158  Device interrogation done today and reviewed by myself:  Battery estimate is 43mo Lead measurements are stable There was a remote noise reversion on the A lead Not reproducible today, lead measurements are stable No arrhythmias  09/20/2017: TTE Left ventricle: The cavity size was moderately dilated. Wall    thickness was increased in a pattern of moderate LVH. Systolic    function was mildly to moderately reduced. The estimated ejection    fraction was in the range of 40% to 45%. Diffuse hypokinesis.    Doppler parameters are consistent with abnormal left ventricular    relaxation (grade 1 diastolic dysfunction). Doppler parameters    are consistent with high ventricular filling pressure.  - Aortic valve: Valve area (VTI): 2.52 cm^2. Valve area (Vmax):    2.69 cm^2. Valve area (Vmean): 2.44 cm^2.   Recent Labs: 07/19/2021: ALT 46; BUN 15; Creat 0.91; Hemoglobin 14.8; Platelets 187; Potassium 4.7; Sodium 134; TSH 2.33  07/19/2021: Cholesterol 164; HDL 45; LDL Cholesterol (Calc) 97; Total CHOL/HDL Ratio 3.6; Triglycerides 119   CrCl cannot be calculated (Patient's most recent lab result is older than the maximum 21 days allowed.).   Wt Readings from Last 3 Encounters:  10/11/21 283 lb  1.9 oz (128.4 kg)  07/14/21 289 lb 1.3 oz (131.1 kg)  11/10/19 (!) 302 lb 1.9 oz (137 kg)     Other studies reviewed: Additional studies/records reviewed today include: summarized above  ASSESSMENT AND PLAN:  ICD Intact function Nearing ERI  [employer] rep was able to re-pair his transmitter Suspects happened because he had been working nights away from the transmitter when it was trying to pair  NICM Chronic CHF Last echo 2018 LVEF 40-45% CorVue would suggest OL Though no symptoms or exam findings of volume OL  Given length of time since here, d/w DOD Will stop amlodipine and lisinopril Start losartan 25mg  daily BMET in 2 weeks  Update echo, particularly since he will be getting gen change in the near future  HTN Med changes as above He will keep an eye on his BP and let us know if gets high, we can titrate his ARB   Disposition: F/u with Dr. Ladona Ridgel in 81mo, sooner if needed.  Current medicines are reviewed at length with the patient today.  The patient did not have any concerns regarding medicines.  Norma Fredrickson, PA-C 03/09/2022 4:53 AM     [facility] [address] [place] [address] [phone] (office)  [phone] (fax)

## 2022-03-12 LAB — CUP PACEART REMOTE DEVICE CHECK
Battery Remaining Longevity: 10 mo
Battery Remaining Percentage: 11 %
Battery Voltage: 2.69 V
Brady Statistic AP VP Percent: 8 %
Brady Statistic AP VS Percent: 1 %
Brady Statistic AS VP Percent: 91 %
Brady Statistic AS VS Percent: 1 %
Brady Statistic RA Percent Paced: 7.4 %
Date Time Interrogation Session: 20230616111646
HighPow Impedance: 84 Ohm
HighPow Impedance: 88 Ohm
Implantable Lead Implant Date: 20160407
Implantable Lead Implant Date: 20160407
Implantable Lead Implant Date: 20160407
Implantable Lead Location: 753858
Implantable Lead Location: 753859
Implantable Lead Location: 753860
Implantable Lead Model: 7122
Implantable Pulse Generator Implant Date: 20160407
Lead Channel Impedance Value: 480 Ohm
Lead Channel Impedance Value: 510 Ohm
Lead Channel Impedance Value: 580 Ohm
Lead Channel Pacing Threshold Amplitude: 0.75 V
Lead Channel Pacing Threshold Amplitude: 1 V
Lead Channel Pacing Threshold Amplitude: 1.5 V
Lead Channel Pacing Threshold Pulse Width: 0.4 ms
Lead Channel Pacing Threshold Pulse Width: 0.4 ms
Lead Channel Pacing Threshold Pulse Width: 0.5 ms
Lead Channel Sensing Intrinsic Amplitude: 1.9 mV
Lead Channel Sensing Intrinsic Amplitude: 12 mV
Lead Channel Setting Pacing Amplitude: 2 V
Lead Channel Setting Pacing Amplitude: 2.5 V
Lead Channel Setting Pacing Amplitude: 2.5 V
Lead Channel Setting Pacing Pulse Width: 0.4 ms
Lead Channel Setting Pacing Pulse Width: 0.5 ms
Lead Channel Setting Sensing Sensitivity: 0.5 mV
Pulse Gen Serial Number: 7250204

## 2022-03-15 NOTE — Progress Notes (Signed)
Remote ICD transmission.   

## 2022-03-24 ENCOUNTER — Other Ambulatory Visit: Payer: Self-pay | Admitting: Sports Medicine

## 2022-03-29 ENCOUNTER — Ambulatory Visit (HOSPITAL_COMMUNITY): Payer: Managed Care, Other (non HMO) | Attending: Cardiology

## 2022-03-29 ENCOUNTER — Other Ambulatory Visit: Payer: Managed Care, Other (non HMO) | Admitting: *Deleted

## 2022-03-29 DIAGNOSIS — I447 Left bundle-branch block, unspecified: Secondary | ICD-10-CM | POA: Insufficient documentation

## 2022-03-29 DIAGNOSIS — I509 Heart failure, unspecified: Secondary | ICD-10-CM | POA: Insufficient documentation

## 2022-03-29 DIAGNOSIS — Z8249 Family history of ischemic heart disease and other diseases of the circulatory system: Secondary | ICD-10-CM | POA: Insufficient documentation

## 2022-03-29 DIAGNOSIS — Z79899 Other long term (current) drug therapy: Secondary | ICD-10-CM

## 2022-03-29 DIAGNOSIS — Z950 Presence of cardiac pacemaker: Secondary | ICD-10-CM | POA: Diagnosis not present

## 2022-03-29 DIAGNOSIS — I428 Other cardiomyopathies: Secondary | ICD-10-CM | POA: Diagnosis present

## 2022-03-29 DIAGNOSIS — E119 Type 2 diabetes mellitus without complications: Secondary | ICD-10-CM | POA: Insufficient documentation

## 2022-03-29 DIAGNOSIS — I110 Hypertensive heart disease with heart failure: Secondary | ICD-10-CM | POA: Diagnosis not present

## 2022-03-29 LAB — ECHOCARDIOGRAM COMPLETE
Area-P 1/2: 4.89 cm2
S' Lateral: 4.2 cm

## 2022-03-30 LAB — BASIC METABOLIC PANEL
BUN/Creatinine Ratio: 13 (ref 9–20)
BUN: 19 mg/dL (ref 6–24)
CO2: 21 mmol/L (ref 20–29)
Calcium: 9.8 mg/dL (ref 8.7–10.2)
Chloride: 95 mmol/L — ABNORMAL LOW (ref 96–106)
Creatinine, Ser: 1.5 mg/dL — ABNORMAL HIGH (ref 0.76–1.27)
Glucose: 153 mg/dL — ABNORMAL HIGH (ref 70–99)
Potassium: 5 mmol/L (ref 3.5–5.2)
Sodium: 137 mmol/L (ref 134–144)
eGFR: 55 mL/min/{1.73_m2} — ABNORMAL LOW (ref >59)

## 2022-04-06 ENCOUNTER — Other Ambulatory Visit: Payer: Self-pay

## 2022-04-06 ENCOUNTER — Other Ambulatory Visit: Payer: Managed Care, Other (non HMO)

## 2022-04-06 DIAGNOSIS — I428 Other cardiomyopathies: Secondary | ICD-10-CM

## 2022-04-06 LAB — BASIC METABOLIC PANEL
BUN/Creatinine Ratio: 14 (ref 9–20)
BUN: 16 mg/dL (ref 6–24)
CO2: 24 mmol/L (ref 20–29)
Calcium: 9.3 mg/dL (ref 8.7–10.2)
Chloride: 97 mmol/L (ref 96–106)
Creatinine, Ser: 1.18 mg/dL (ref 0.76–1.27)
Glucose: 228 mg/dL — ABNORMAL HIGH (ref 70–99)
Potassium: 4.8 mmol/L (ref 3.5–5.2)
Sodium: 136 mmol/L (ref 134–144)
eGFR: 74 mL/min/{1.73_m2} (ref >59)

## 2022-04-11 ENCOUNTER — Ambulatory Visit: Payer: Managed Care, Other (non HMO) | Admitting: Sports Medicine

## 2022-04-11 VITALS — BP 113/80 | HR 94 | Ht 70.0 in | Wt 267.0 lb

## 2022-04-11 DIAGNOSIS — E119 Type 2 diabetes mellitus without complications: Secondary | ICD-10-CM

## 2022-04-11 DIAGNOSIS — Z0000 Encounter for general adult medical examination without abnormal findings: Secondary | ICD-10-CM

## 2022-04-11 DIAGNOSIS — Z23 Encounter for immunization: Secondary | ICD-10-CM

## 2022-04-11 LAB — POCT GLYCOSYLATED HEMOGLOBIN (HGB A1C): HbA1c POC (<> result, manual entry): 6 % (ref 4.0–5.6)

## 2022-04-11 NOTE — Assessment & Plan Note (Signed)
Shingrix #2 today, Cologuard is still sitting on his toilet. He agrees to get it done before his 57-month follow-up.

## 2022-04-11 NOTE — Progress Notes (Signed)
    Procedures performed today:    None.  Independent interpretation of notes and tests performed by another provider:   None.  Brief History, Exam, Impression, and Recommendations:    Annual physical exam Shingrix #2 today, Cologuard is still sitting on his toilet. He agrees to get it done before his 43-month follow-up.  Controlled type 2 diabetes mellitus without complication, without long-term current use of insulin (HCC) We have made some major changes to Andre [name] his diabetes regimen, he is now on [place] and Rybelsus 14. He lost 20 pounds and his A1c is now 6%, no change in plan, return in 6 months for repeat A1c. Still has some neuropathic symptoms, but not bad enough to change medication, I did inform him 6 months to a year could be needed for the nerves to recover from his previously uncontrolled diabetes.    ____________________________________________ Andre [name]. Andre [name], M.D., ABFM., CAQSM., AME. 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

## 2022-04-11 NOTE — Assessment & Plan Note (Addendum)
We have made some major changes to [place] his diabetes regimen, he is now on Synjardy and Rybelsus 14. He lost 20 pounds and his A1c is now 6%, no change in plan, return in 6 months for repeat A1c. Still has some neuropathic symptoms, but not bad enough to change medication, I did inform him 6 months to a year could be needed for the nerves to recover from his previously uncontrolled diabetes.

## 2022-04-21 ENCOUNTER — Encounter: Payer: Self-pay | Admitting: Sports Medicine

## 2022-04-27 LAB — COLOGUARD: COLOGUARD: NEGATIVE

## 2022-05-15 ENCOUNTER — Ambulatory Visit (INDEPENDENT_AMBULATORY_CARE_PROVIDER_SITE_OTHER): Payer: Managed Care, Other (non HMO)

## 2022-05-15 ENCOUNTER — Ambulatory Visit
Admission: EM | Admit: 2022-05-15 | Discharge: 2022-05-15 | Disposition: A | Discharge status: Home / Self Care | Payer: Managed Care, Other (non HMO)

## 2022-05-15 DIAGNOSIS — M25571 Pain in right ankle and joints of right foot: Secondary | ICD-10-CM | POA: Diagnosis not present

## 2022-05-15 DIAGNOSIS — S82831A Other fracture of upper and lower end of right fibula, initial encounter for closed fracture: Secondary | ICD-10-CM | POA: Diagnosis not present

## 2022-05-15 DIAGNOSIS — S8261XA Displaced fracture of lateral malleolus of right fibula, initial encounter for closed fracture: Secondary | ICD-10-CM

## 2022-05-15 DIAGNOSIS — M25561 Pain in right knee: Secondary | ICD-10-CM

## 2022-05-15 MED ORDER — HYDROCODONE-ACETAMINOPHEN 5-325 MG PO TABS: 1.0000 | ORAL_TABLET | Freq: Four times a day (QID) | ORAL | 0 refills | Status: DC | PRN

## 2022-05-15 MED ORDER — CELECOXIB 100 MG PO CAPS: 100.0000 mg | ORAL_CAPSULE | Freq: Two times a day (BID) | ORAL | 0 refills | Status: AC
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
## 2022-05-15 NOTE — Discharge Instructions (Addendum)
Advised/informed patient of right ankle x-ray/right knee x-ray results with hard copy provided.  Advised patient to wear cam walker boot 24/7 until being evaluated by [facility] orthopedic provider.  Contact information for this provider is below.  Instructed patient to contact this provider now after this office visit.  Advised patient to take medication (Celebrex) as directed with food to completion.  Advised patient may use Vicodin daily, as needed for breakthrough pain.
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
## 2022-05-16 ENCOUNTER — Telehealth: Payer: Self-pay

## 2022-05-16 ENCOUNTER — Encounter: Payer: Self-pay | Admitting: Family Medicine

## 2022-05-16 ENCOUNTER — Ambulatory Visit: Payer: Managed Care, Other (non HMO) | Admitting: Family Medicine

## 2022-05-16 VITALS — BP 110/78 | Ht 70.0 in | Wt 260.0 lb

## 2022-05-16 DIAGNOSIS — S8261XA Displaced fracture of lateral malleolus of right fibula, initial encounter for closed fracture: Secondary | ICD-10-CM | POA: Insufficient documentation
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
## 2022-05-16 NOTE — Patient Instructions (Addendum)
Nice to meet you Please use crutches  Please elevate and ice as needed   Please send me a message in MyChart with any questions or updates.  Please see me back as needed.   --Dr. Dierd[facility] [address] Nicki [place] 05/21/22 @ 1p [place] [phone]

## 2022-05-16 NOTE — Telephone Encounter (Signed)
Pt states feeling ok since UC visit. Headed to pick up meds today and has ortho appt later today as well. Advised to call if any questions or concerns.

## 2022-05-22 ENCOUNTER — Telehealth: Payer: Self-pay | Admitting: *Deleted

## 2022-05-22 NOTE — Telephone Encounter (Signed)
Pt agreeable to add on for 05/24/22 pre op tele appt. Med rec and consent are done. Pre op schedules are full for the remainder of the is week. Office closed for Labor Monday 05/28/22. Pt's procedure is 05/31/22.     Patient Consent for Virtual Visit        Andre [name]. has provided verbal consent on 05/22/2022 for a virtual visit (video or telephone).   CONSENT FOR VIRTUAL VISIT FOR:  Andre [name].  By participating in this virtual visit I agree to the following:  I hereby voluntarily request, consent and authorize [facility] and its employed or contracted physicians, physician assistants, nurse practitioners or other licensed health care professionals (the Practitioner), to provide me with telemedicine health care services (the "Services") as deemed necessary by the treating Practitioner. I acknowledge and consent to receive the Services by the Practitioner via telemedicine. I understand that the telemedicine visit will involve communicating with the Practitioner through live audiovisual communication technology and the disclosure of certain medical information by electronic transmission. I acknowledge that I have been given the opportunity to request an in-person assessment or other available alternative prior to the telemedicine visit and am voluntarily participating in the telemedicine visit.  I understand that I have the right to withhold or withdraw my consent to the use of telemedicine in the course of my care at any time, without affecting my right to future care or treatment, and that the Practitioner or I may terminate the telemedicine visit at any time. I understand that I have the right to inspect all information obtained and/or recorded in the course of the telemedicine visit and may receive copies of available information for a reasonable fee.  I understand that some of the potential risks of receiving the Services via telemedicine include:  Delay or interruption in  medical evaluation due to technological equipment failure or disruption; Information transmitted may not be sufficient (e.g. poor resolution of images) to allow for appropriate medical decision making by the Practitioner; and/or  In rare instances, security protocols could fail, causing a breach of personal health information.  Furthermore, I acknowledge that it is my responsibility to provide information about my medical history, conditions and care that is complete and accurate to the best of my ability. I acknowledge that Practitioner's advice, recommendations, and/or decision may be based on factors not within their control, such as incomplete or inaccurate data provided by me or distortions of diagnostic images or specimens that may result from electronic transmissions. I understand that the practice of medicine is not an exact science and that Practitioner makes no warranties or guarantees regarding treatment outcomes. I acknowledge that a copy of this consent can be made available to me via my patient portal [facility] MyChart), or I can request a printed copy by calling the office of [facility].    I understand that my insurance will be billed for this visit.   I have read or had this consent read to me. I understand the contents of this consent, which adequately explains the benefits and risks of the Services being provided via telemedicine.  I have been provided ample opportunity to ask questions regarding this consent and the Services and have had my questions answered to my satisfaction. I give my informed consent for the services to be provided through the use of telemedicine in my medical care

## 2022-05-22 NOTE — Telephone Encounter (Signed)
   Name: Andre [name].  DOB: Sep 22, 1966  MRN: [id number]  Primary Cardiologist: None   Preoperative team, please contact this patient and set up a phone call appointment for further preoperative risk assessment. Please obtain consent and complete medication review. Thank you for your help.  I confirm that guidance regarding antiplatelet and oral anticoagulation therapy has been completed and, if necessary, noted below.  - There does not appear to be any contraindication to holding ASA from a cardiac standpoint.  Tereso Newcomer, PA-C 05/22/2022, 12:16 PM [facility]

## 2022-05-22 NOTE — Telephone Encounter (Signed)
   Pre-operative Risk Assessment    Patient Name: Andre [name].  DOB: 09-04-1966 MRN: [id number]      Request for Surgical Clearance    Procedure:   OPEN TREATMENT RIGHT BIMALLEOLAR ANKLE FRACTURE, POSSIBLE OPEN TREATMENT OF SYNDESMOSIS  Date of Surgery:  Clearance 05/31/22                                 Surgeon:  DR. Nicki Guadalajara Surgeon's Group or Practice Name:  [facility] Phone number:  [phone] Fax number:  [phone] ATTN: Barbara Cower   Type of Clearance Requested:   - Medical ; ASA    Type of Anesthesia:  General    Additional requests/questions:    Elpidio Anis   05/22/2022, 11:07 AM

## 2022-05-22 NOTE — Telephone Encounter (Signed)
Pt agreeable to add on for 05/24/22 pre op tele appt. Med rec and consent are done. Pre op schedules are full for the remainder of the is week. Office closed for Labor Monday 05/28/22. Pt's procedure is 05/31/22.

## 2022-05-24 ENCOUNTER — Ambulatory Visit: Payer: Managed Care, Other (non HMO) | Attending: Cardiology | Admitting: Physician Assistant

## 2022-05-24 DIAGNOSIS — Z01810 Encounter for preprocedural cardiovascular examination: Secondary | ICD-10-CM | POA: Diagnosis not present

## 2022-05-24 NOTE — Progress Notes (Signed)
Virtual Visit via Telephone Note   Because of Andre WINDHORST Jr.'s co-morbid illnesses, he is at least at moderate risk for complications without adequate follow up.  This format is felt to be most appropriate for this patient at this time.  The patient did not have access to video technology/had technical difficulties with video requiring transitioning to audio format only (telephone).  All issues noted in this document were discussed and addressed.  No physical exam could be performed with this format.  Please refer to the patient's chart for his consent to telehealth for [facility].  Evaluation Performed:  Preoperative cardiovascular risk assessment _____________   Date:  05/24/2022   Patient ID:  Andre [name]., DOB 03-02-1966, MRN [id number] Patient Location:  Home Provider location:   Office  Primary Care Provider:  Monica Becton, MD Primary Cardiologist:  Dr. Ladona Ridgel  Chief Complaint / Patient Profile   56 y.o. y/o male with a h/o LBBB, NICM, chronic systolic heart failure s/p ICD, hypertension and DM2 who is pending ankle surgery and presents today for telephonic preoperative cardiovascular risk assessment.  Past Medical History    Past Medical History:  Diagnosis Date   CHF (congestive heart failure) (HCC)    Diabetes mellitus without complication (HCC)    Gout    Hypertension    LBBB (left bundle branch block)    Non-ischemic cardiomyopathy (HCC)    a. s/p STJ CRTD   PVC (premature ventricular contraction)    Past Surgical History:  Procedure Laterality Date   HIP SURGERY     IMPLANTABLE CARDIOVERTER DEFIBRILLATOR IMPLANT N/A 12/30/2014   STJ CRTD implanted by Dr Ladona Ridgel   WRIST SURGERY      Allergies  No Known Allergies  History of Present Illness    Andre Verbrugge. is a 56 y.o. male who presents via audio/video conferencing for a telehealth visit today.  Pt was last seen in cardiology clinic on 03/09/2022 by Francis Dowse PA-C.   At that time Andre Pagnotta. was doing well.  The patient is now pending procedure as outlined above. Since his last visit, he has been doing well without any exertional chest pain or worsening dyspnea.  He has no problem accomplishing more than 4 METS of activity prior to the recent fracture.  Unfortunately, he suffered a right ankle fracture while the motorcycle he was working on fell over him.   Home Medications    Prior to Admission medications   Medication Sig Start Date End Date Taking? Authorizing Provider  allopurinol (ZYLOPRIM) 300 MG tablet TAKE 1 TABLET TWICE A DAY 08/21/21   Monica Becton, MD  AMBULATORY NON FORMULARY MEDICATION Single glucometer with lancets, test strips, testing 3 times daily 02/02/19   Monica Becton, MD  ASPIRIN LOW DOSE 81 MG EC tablet TAKE 1 TABLET DAILY 09/19/21   Monica Becton, MD  carvedilol (COREG) 25 MG tablet Take 1 tablet (25 mg total) by mouth 2 (two) times daily with a meal. 07/14/21 05/22/22  Monica Becton, MD  celecoxib (CELEBREX) 100 MG capsule Take 1 capsule (100 mg total) by mouth 2 (two) times daily for 15 days. 05/15/22 05/30/22  Trevor Iha, FNP  clotrimazole-betamethasone (LOTRISONE) cream Apply 1 application topically 2 (two) times daily. 12/08/19   Monica Becton, MD  Continuous Blood Gluc Receiver (DEXCOM G6 RECEIVER) DEVI 1 each by Does not apply route daily. 02/02/19   Monica Becton, MD  Continuous Blood Gluc  Transmit (DEXCOM G6 TRANSMITTER) MISC 1 each by Does not apply route daily. 02/02/19   Monica Becton, MD  Empagliflozin-metFORMIN HCl ER (SYNJARDY XR) 25-1000 MG TB24 Take 1 tablet by mouth daily. 10/17/21   Monica Becton, MD  furosemide (LASIX) 40 MG tablet TAKE ONE TABLET BY MOUTH TWICE A WEEK ON MONDAYS AND THURSDAYS 04/05/21   Everrett Coombe, DO  glipiZIDE (GLUCOTROL XL) 10 MG 24 hr tablet TAKE 2 TABLETS DAILY WITH BREAKFAST 07/14/21   Monica Becton, MD   HYDROcodone-acetaminophen (NORCO/VICODIN) 5-325 MG tablet Take 1-2 tablets by mouth every 6 (six) hours as needed. 05/15/22   Trevor Iha, FNP  ibuprofen (ADVIL) 400 MG tablet Take 400 mg by mouth every 6 (six) hours as needed.    [provider]  levothyroxine (SYNTHROID) 50 MCG tablet TAKE 1 TABLET DAILY BEFORE BREAKFAST 03/26/22   Monica Becton, MD  losartan (COZAAR) 25 MG tablet Take 1 tablet (25 mg total) by mouth daily. 03/09/22 06/07/22  Sheilah Pigeon, PA-C  magnesium oxide (MAG-OX) 400 MG tablet Take 2 tablets (800 mg total) by mouth at bedtime. 07/14/21   Monica Becton, MD  Semaglutide (RYBELSUS) 14 MG TABS Take 1 tablet (14 mg total) by mouth daily. 02/06/22   Monica Becton, MD  spironolactone (ALDACTONE) 25 MG tablet TAKE 1 TABLET TWICE A DAY 08/21/21   Monica Becton, MD  triamcinolone (KENALOG) 0.025 % cream Apply 1 application topically 2 (two) times daily. To left upper eyelid 08/25/21   Monica Becton, MD    Physical Exam    Vital Signs:  Andre [name]. does not have vital signs available for review today.  Given telephonic nature of communication, physical exam is limited. AAOx3. NAD. Normal affect.  Speech and respirations are unlabored.  Accessory Clinical Findings    None  Assessment & Plan    1.  Preoperative Cardiovascular Risk Assessment:  -Patient has recent ankle fracture and require surgery.  He reportedly had a normal cardiac catheterization in 2004, low risk nuclear stress test in 2015.  He has a history of nonischemic cardiomyopathy.  Most recent echocardiogram obtained in July 2023 showed EF 35 to 40%.  Patient has no exertional chest pain or worsening dyspnea.  Prior to the ankle fracture, he was able to accomplish more than 4 METS of activity without any issue.  He is at acceptable risk to proceed from the cardiac perspective.  If needed, he may hold aspirin for 5 to 7 days prior to the surgery and  restart as soon as possible afterward at the surgeon's discretion.    A copy of this note will be routed to requesting surgeon.  Time:   Today, I have spent 6 minutes with the patient with telehealth technology discussing medical history, symptoms, and management plan.     [place], [place]  05/24/2022, 3:12 PM

## 2022-05-29 ENCOUNTER — Other Ambulatory Visit: Payer: Self-pay | Admitting: Orthopaedic Surgery

## 2022-05-30 ENCOUNTER — Other Ambulatory Visit: Payer: Self-pay

## 2022-05-30 ENCOUNTER — Ambulatory Visit: Payer: Managed Care, Other (non HMO) | Admitting: Sports Medicine

## 2022-05-30 ENCOUNTER — Encounter (HOSPITAL_COMMUNITY): Payer: Self-pay | Admitting: Orthopaedic Surgery

## 2022-05-30 NOTE — Progress Notes (Signed)
PERIOPERATIVE PRESCRIPTION FOR IMPLANTED CARDIAC DEVICE PROGRAMMING  Patient Information: Name:  Andre [name].  DOB:  13-Mar-1966  MRN:  [id number]    Planned Procedure:  Open Treatment ankle fracture  Surgeon:  Susa Simmonds  Date of Procedure:  05/31/22  Cautery will be used.  Position during surgery:  Supine   Device Information:  Clinic EP Physician:  Lewayne Bunting, MD   Device Type:  Defibrillator Manufacturer and Phone #:  [employer]: [phone] Pacemaker Dependent?:  No. Date of Last Device Check:  03/09/22 in clinic, 05/21/22 Remote Normal Device Function?:  Yes.    Electrophysiologist's Recommendations:  Have magnet available. Provide continuous ECG monitoring when magnet is used or reprogramming is to be performed.  Procedure should not interfere with device function.  No device programming or magnet placement needed.  Per Device Clinic Standing Orders, Kizzie Ide, RN  4:35 PM 05/30/2022

## 2022-05-30 NOTE — Telephone Encounter (Deleted)
-----   Message from Ginger E Gleason, RN sent at 05/30/2022 10:21 AM EDT ----- Regarding: Perioperative Cardiac Device Programming Planned Procedure:  Open Treatment ankle fracture Surgeon:  Susa Simmonds Date of Procedure:  05/31/22 Cautery will be used. Position during surgery:  Supine  Please send documentation back to: [facility] (Fax # [phone])  Gleason, Gretta Cool, RN  05/12/2021 3:57 PM

## 2022-05-30 NOTE — Anesthesia Preprocedure Evaluation (Addendum)
Anesthesia Evaluation  Patient identified by MRN, date of birth, ID band Patient awake    Reviewed: Allergy & Precautions, NPO status , Patient's Chart, lab work & pertinent test results  Airway Mallampati: II  TM Distance: >3 FB Neck ROM: Full    Dental no notable dental hx. (+) Teeth Intact   Pulmonary neg pulmonary ROS,    Pulmonary exam normal breath sounds clear to auscultation       Cardiovascular Exercise Tolerance: Good hypertension, Normal cardiovascular exam+ dysrhythmias + Cardiac Defibrillator (for Non ischemic Cardiomyopaty)  Rhythm:Regular Rate:Normal  Non ischemic cardiomyopathy  TTE 03/29/2022: 1. Notable dyssynchrony. Left ventricular ejection fraction, by  estimation, is 35 to 40%. The left ventricle has moderately decreased  function. The left ventricle demonstrates global hypokinesis. There is  mild concentric left ventricular hypertrophy.  Left ventricular diastolic parameters are indeterminate. The average left  ventricular global longitudinal strain is -16.5 %. The global longitudinal  strain is abnormal.  2. Right ventricular systolic function is normal. The right ventricular  size is mildly enlarged. There is normal pulmonary artery systolic  pressure.  3. Left atrial size was mildly dilated.  4. The mitral valve is grossly normal. Trivial mitral valve  regurgitation. No evidence of mitral stenosis.  5. The aortic valve is grossly normal. Aortic valve regurgitation is not  visualized. No aortic stenosis is present.  6. Aortic dilatation noted. There is mild dilatation of the ascending  aorta, measuring 40 mm.  7. The inferior vena cava is normal in size with <50% respiratory  variability, suggesting right atrial pressure of 8 mmHg.    Neuro/Psych negative neurological ROS  negative psych ROS   GI/Hepatic   Endo/Other  diabetes, Type 2Hypothyroidism   Renal/GU       Musculoskeletal  (+) Arthritis ,   Abdominal (+) + obese,   Peds  Hematology   Anesthesia Other Findings   Reproductive/Obstetrics                          Anesthesia Physical Anesthesia Plan  ASA: 3  Anesthesia Plan: Regional and MAC   Post-op Pain Management: Regional block* and Minimal or no pain anticipated   Induction:   PONV Risk Score and Plan: 2 and Propofol infusion and Treatment may vary due to age or medical condition  Airway Management Planned: Nasal Cannula, Natural Airway and Simple Face Mask  Additional Equipment: None  Intra-op Plan:   Post-operative Plan:   Informed Consent: I have reviewed the patients History and Physical, chart, labs and discussed the procedure including the risks, benefits and alternatives for the proposed anesthesia with the patient or authorized representative who has indicated his/her understanding and acceptance.     Dental advisory given  Plan Discussed with: CRNA  Anesthesia Plan Comments: (PAT note by Karoline Caldwell, PA-C:  [place] cardiology for history of left bundle branch block, nonischemic cardiomyopathy, systolic heart failure s/p [employer] CRT-D 12/2014, HTN.  Last seen via televisit by Almyra Deforest, PA on 05/24/2022 for preop evaluation.  Per note, "-Patient has recent ankle fracture and require surgery. He reportedly had a normal cardiac catheterization in 2004, low risk nuclear stress test in 2015. He has a history of nonischemic cardiomyopathy. Most recent echocardiogram obtained in July 2023 showed EF 35 to 40%. Patient has no exertional chest pain or worsening dyspnea. Prior to the ankle fracture, he was able to accomplish more than 4 METS of activity without any issue. He is at  acceptable risk to proceed from the cardiac perspective. If needed, he may hold aspirin for 5 to 7 days prior to the surgery and restart as soon as possible afterward at the surgeon's discretion."   Pt on semiglutide  po qD last taken yesterday  Non-insulin-dependent DM2, last A1c 6.0 on 04/11/2022.  Patient will need day of surgery labs and evaluation.  EKG 03/09/2022: Paced rhythm, rate 72  TTE 03/29/2022: 1. Notable dyssynchrony. Left ventricular ejection fraction, by  estimation, is 35 to 40%. The left ventricle has moderately decreased  function. The left ventricle demonstrates global hypokinesis. There is  mild concentric left ventricular hypertrophy.  Left ventricular diastolic parameters are indeterminate. The average left  ventricular global longitudinal strain is -16.5 %. The global longitudinal  strain is abnormal.  2. Right ventricular systolic function is normal. The right ventricular  size is mildly enlarged. There is normal pulmonary artery systolic  pressure.  3. Left atrial size was mildly dilated.  4. The mitral valve is grossly normal. Trivial mitral valve  regurgitation. No evidence of mitral stenosis.  5. The aortic valve is grossly normal. Aortic valve regurgitation is not  visualized. No aortic stenosis is present.  6. Aortic dilatation noted. There is mild dilatation of the ascending  aorta, measuring 40 mm.  7. The inferior vena cava is normal in size with <50% respiratory  variability, suggesting right atrial pressure of 8 mmHg.   Comparison(s): Prior images reviewed side by side. EF slightly worsened  compared to prior, with significant LV dyssynchrony.  )      Anesthesia Quick Evaluation

## 2022-05-30 NOTE — Progress Notes (Addendum)
Device orders requested through staff message on 05/30/22 @ 1027  1029 Called [employer] for [employer], awaiting call back.   1207 Received return phone call from [employer], Jeannett Senior.  Informed him of date and time of surgery.  He stated that there should not need to be anything done for the patient's ICD with this type of surgery.

## 2022-05-30 NOTE — Telephone Encounter (Signed)
Erroneous encounter

## 2022-05-30 NOTE — Progress Notes (Signed)
PCP - Dr. Reed Breech Cardiologist - Dr. Tenny Craw EKG - 03/09/22 Chest x-ray -  ECHO - 03/29/22 Cardiac Cath - Yes back in maybe 2006 CPAP - No OSA Fasting Blood Sugar:  250 Checks Blood Sugar:  Intermittently Blood Thinner Instructions: No Blood thinner Aspirin Instructions: Has been on hold for 5 days ERAS Protcol -  Clears until 3 hours prior 1130 COVID TEST- NI  Anesthesia review: Yes cardiac history  -------------  SDW INSTRUCTIONS:  Your procedure is scheduled on 05/31/22 Thursday. Please report to [facility] Main Entrance "A" at 1200 P.M., and check in at the Admitting office. Call this number if you have problems the morning of surgery: [phone]   Remember: Do not eat solid food after midnight the night before your surgery  You may drink clear liquids until  1130 the morning of your surgery.   Clear liquids allowed are: Water, Non-Citrus Juices (without pulp), Carbonated Beverages, Clear Tea, Black Coffee Only, and Gatorade   Medications to take morning of surgery with a sip of water include: Allopurinol, Coreg, Synthroid  IF needed: Tylenol, Flonase and Vicodin DM MEDICATIONS: NONE on day of surgery  As of today, STOP taking any Aspirin (unless otherwise instructed by your surgeon), Aleve, Naproxen, Ibuprofen, Motrin, Advil, Goody's, BC's, all herbal medications, fish oil, and all vitamins.    The Morning of Surgery Do not wear jewelry Do not wear lotions, powders, or colognes, or deodorant Do not bring valuables to the hospital. [facility] is not responsible for any belongings or valuables.  If you are a smoker, DO NOT Smoke 24 hours prior to surgery  Remember that you must have someone to transport you home after your surgery, and remain with you for 24 hours if you are discharged the same day.  Please bring cases for contacts, glasses, hearing aids, dentures or bridgework because it cannot be worn into surgery.   Patients discharged the day of surgery  will not be allowed to drive home.   Please shower the NIGHT BEFORE/MORNING OF SURGERY (use antibacterial soap like DIAL soap if possible). Wear comfortable clothes the morning of surgery. Oral Hygiene is also important to reduce your risk of infection.  Remember - BRUSH YOUR TEETH THE MORNING OF SURGERY WITH YOUR REGULAR TOOTHPASTE  Patient denies shortness of breath, fever, cough and chest pain.

## 2022-05-30 NOTE — Progress Notes (Signed)
Anesthesia Chart Review: Same day workup  Follows cardiology for history of left bundle branch block, nonischemic cardiomyopathy, systolic heart failure s/p Abbott CRT-D 12/2014, HTN.  Last seen via televisit by Azalee Course, PA on 05/24/2022 for preop evaluation.  Per note, "   -Patient has recent ankle fracture and require surgery.  He reportedly had a normal cardiac catheterization in 2004, low risk nuclear stress test in 2015.  He has a history of nonischemic cardiomyopathy.  Most recent echocardiogram obtained in July 2023 showed EF 35 to 40%.  Patient has no exertional chest pain or worsening dyspnea.  Prior to the ankle fracture, he was able to accomplish more than 4 METS of activity without any issue.  He is at acceptable risk to proceed from the cardiac perspective.  If needed, he may hold aspirin for 5 to 7 days prior to the surgery and restart as soon as possible afterward at the surgeon's discretion."  Non-insulin-dependent DM2, last A1c 6.0 on 04/11/2022.  Patient will need day of surgery labs and evaluation.  EKG 03/09/2022: Paced rhythm, rate 72  TTE 03/29/2022:  1. Notable dyssynchrony. Left ventricular ejection fraction, by  estimation, is 35 to 40%. The left ventricle has moderately decreased  function. The left ventricle demonstrates global hypokinesis. There is  mild concentric left ventricular hypertrophy.  Left ventricular diastolic parameters are indeterminate. The average left  ventricular global longitudinal strain is -16.5 %. The global longitudinal  strain is abnormal.   2. Right ventricular systolic function is normal. The right ventricular  size is mildly enlarged. There is normal pulmonary artery systolic  pressure.   3. Left atrial size was mildly dilated.   4. The mitral valve is grossly normal. Trivial mitral valve  regurgitation. No evidence of mitral stenosis.   5. The aortic valve is grossly normal. Aortic valve regurgitation is not  visualized. No aortic stenosis  is present.   6. Aortic dilatation noted. There is mild dilatation of the ascending  aorta, measuring 40 mm.   7. The inferior vena cava is normal in size with <50% respiratory  variability, suggesting right atrial pressure of 8 mmHg.   Comparison(s): Prior images reviewed side by side. EF slightly worsened  compared to prior, with significant LV dyssynchrony.     [place], [place] [facility] Short Stay Center/Anesthesiology Phone [phone] 05/30/2022 12:42 PM

## 2022-05-31 ENCOUNTER — Other Ambulatory Visit: Payer: Self-pay

## 2022-05-31 ENCOUNTER — Ambulatory Visit (HOSPITAL_COMMUNITY): Payer: Managed Care, Other (non HMO)

## 2022-05-31 ENCOUNTER — Ambulatory Visit (HOSPITAL_COMMUNITY): Payer: Managed Care, Other (non HMO) | Admitting: Physician Assistant

## 2022-05-31 ENCOUNTER — Encounter (HOSPITAL_COMMUNITY): Payer: Self-pay | Admitting: Orthopaedic Surgery

## 2022-05-31 ENCOUNTER — Encounter (HOSPITAL_COMMUNITY)
Admission: RE | Disposition: A | Discharge status: Home / Self Care | Payer: Self-pay | Source: Ambulatory Visit | Attending: Orthopaedic Surgery

## 2022-05-31 ENCOUNTER — Ambulatory Visit (HOSPITAL_COMMUNITY)
Admission: RE | Admit: 2022-05-31 | Discharge: 2022-05-31 | Disposition: A | Discharge status: Home / Self Care | Payer: Managed Care, Other (non HMO) | Source: Ambulatory Visit | Attending: Orthopaedic Surgery | Admitting: Orthopaedic Surgery

## 2022-05-31 DIAGNOSIS — I110 Hypertensive heart disease with heart failure: Secondary | ICD-10-CM | POA: Insufficient documentation

## 2022-05-31 DIAGNOSIS — E039 Hypothyroidism, unspecified: Secondary | ICD-10-CM

## 2022-05-31 DIAGNOSIS — E669 Obesity, unspecified: Secondary | ICD-10-CM | POA: Insufficient documentation

## 2022-05-31 DIAGNOSIS — Z6837 Body mass index (BMI) 37.0-37.9, adult: Secondary | ICD-10-CM | POA: Insufficient documentation

## 2022-05-31 DIAGNOSIS — E119 Type 2 diabetes mellitus without complications: Secondary | ICD-10-CM

## 2022-05-31 DIAGNOSIS — W010XXA Fall on same level from slipping, tripping and stumbling without subsequent striking against object, initial encounter: Secondary | ICD-10-CM | POA: Insufficient documentation

## 2022-05-31 DIAGNOSIS — I509 Heart failure, unspecified: Secondary | ICD-10-CM | POA: Diagnosis not present

## 2022-05-31 DIAGNOSIS — Z7984 Long term (current) use of oral hypoglycemic drugs: Secondary | ICD-10-CM | POA: Diagnosis not present

## 2022-05-31 DIAGNOSIS — S82841A Displaced bimalleolar fracture of right lower leg, initial encounter for closed fracture: Secondary | ICD-10-CM | POA: Insufficient documentation

## 2022-05-31 DIAGNOSIS — I428 Other cardiomyopathies: Secondary | ICD-10-CM | POA: Diagnosis not present

## 2022-05-31 DIAGNOSIS — I10 Essential (primary) hypertension: Secondary | ICD-10-CM

## 2022-05-31 HISTORY — PX: ORIF ANKLE FRACTURE: SHX5408

## 2022-05-31 HISTORY — PX: SYNDESMOSIS REPAIR: SHX5182

## 2022-05-31 LAB — SURGICAL PCR SCREEN
MRSA, PCR: NEGATIVE
Staphylococcus aureus: NEGATIVE

## 2022-05-31 LAB — POCT I-STAT, CHEM 8
BUN: 16 mg/dL (ref 6–20)
Calcium, Ion: 1.23 mmol/L (ref 1.15–1.40)
Chloride: 98 mmol/L (ref 98–111)
Creatinine, Ser: 1 mg/dL (ref 0.61–1.24)
Glucose, Bld: 163 mg/dL — ABNORMAL HIGH (ref 70–99)
HCT: 50 % (ref 39.0–52.0)
Hemoglobin: 17 g/dL (ref 13.0–17.0)
Potassium: 4.2 mmol/L (ref 3.5–5.1)
Sodium: 137 mmol/L (ref 135–145)
TCO2: 26 mmol/L (ref 22–32)

## 2022-05-31 LAB — GLUCOSE, CAPILLARY
Glucose-Capillary: 165 mg/dL — ABNORMAL HIGH (ref 70–99)
Glucose-Capillary: 141 mg/dL — ABNORMAL HIGH (ref 70–99)

## 2022-05-31 SURGERY — OPEN REDUCTION INTERNAL FIXATION (ORIF) ANKLE FRACTURE
Anesthesia: Monitor Anesthesia Care | Site: Ankle | Laterality: Right

## 2022-05-31 MED ORDER — FENTANYL CITRATE (PF) 250 MCG/5ML IJ SOLN
INTRAMUSCULAR | Status: AC
  Filled 2022-05-31: qty 5

## 2022-05-31 MED ORDER — INSULIN ASPART 100 UNIT/ML IJ SOLN
0.0000 [IU] | INTRAMUSCULAR | Status: DC | PRN
  Administered 2022-05-31: 2 [IU] via SUBCUTANEOUS

## 2022-05-31 MED ORDER — LIDOCAINE 2% (20 MG/ML) 5 ML SYRINGE
INTRAMUSCULAR | Status: AC
  Filled 2022-05-31: qty 5

## 2022-05-31 MED ORDER — MIDAZOLAM HCL 2 MG/2ML IJ SOLN
INTRAMUSCULAR | Status: AC
  Administered 2022-05-31: 2 mg via INTRAVENOUS
  Filled 2022-05-31: qty 2

## 2022-05-31 MED ORDER — CHLORHEXIDINE GLUCONATE 0.12 % MT SOLN: 15.0000 mL | Freq: Once | OROMUCOSAL | Status: AC

## 2022-05-31 MED ORDER — PROPOFOL 500 MG/50ML IV EMUL
INTRAVENOUS | Status: DC | PRN
  Administered 2022-05-31: 100 ug/kg/min via INTRAVENOUS
  Administered 2022-05-31: 75 ug/kg/min via INTRAVENOUS

## 2022-05-31 MED ORDER — PROPOFOL 10 MG/ML IV BOLUS
INTRAVENOUS | Status: AC
  Filled 2022-05-31: qty 20

## 2022-05-31 MED ORDER — CEFAZOLIN IN SODIUM CHLORIDE 3-0.9 GM/100ML-% IV SOLN
INTRAVENOUS | Status: DC
Start: 2022-05-31 — End: 2022-05-31
  Filled 2022-05-31: qty 100

## 2022-05-31 MED ORDER — ROPIVACAINE HCL 5 MG/ML IJ SOLN
INTRAMUSCULAR | Status: DC | PRN
  Administered 2022-05-31: 25 mL via PERINEURAL

## 2022-05-31 MED ORDER — PROPOFOL 10 MG/ML IV BOLUS
INTRAVENOUS | Status: DC | PRN
  Administered 2022-05-31: 10 mg via INTRAVENOUS

## 2022-05-31 MED ORDER — INSULIN ASPART 100 UNIT/ML IJ SOLN
INTRAMUSCULAR | Status: AC
  Filled 2022-05-31: qty 1

## 2022-05-31 MED ORDER — FENTANYL CITRATE (PF) 100 MCG/2ML IJ SOLN
INTRAMUSCULAR | Status: AC
  Administered 2022-05-31: 100 ug via INTRAVENOUS
  Filled 2022-05-31: qty 2

## 2022-05-31 MED ORDER — ASPIRIN 325 MG PO TABS: 325.0000 mg | ORAL_TABLET | Freq: Every day | ORAL | 0 refills | Status: AC

## 2022-05-31 MED ORDER — ONDANSETRON HCL 4 MG/2ML IJ SOLN
INTRAMUSCULAR | Status: AC
  Filled 2022-05-31: qty 2

## 2022-05-31 MED ORDER — FENTANYL CITRATE (PF) 100 MCG/2ML IJ SOLN: 50.0000 ug | INTRAMUSCULAR | Status: DC

## 2022-05-31 MED ORDER — MIDAZOLAM HCL 2 MG/2ML IJ SOLN: 1.0000 mg | INTRAMUSCULAR | Status: DC

## 2022-05-31 MED ORDER — ROPIVACAINE HCL 7.5 MG/ML IJ SOLN
INTRAMUSCULAR | Status: DC | PRN
  Administered 2022-05-31: 20 mL via PERINEURAL

## 2022-05-31 MED ORDER — CEFAZOLIN SODIUM-DEXTROSE 2-4 GM/100ML-% IV SOLN
2.0000 g | INTRAVENOUS | Status: AC
  Administered 2022-05-31: 2 g via INTRAVENOUS
  Filled 2022-05-31: qty 100

## 2022-05-31 MED ORDER — CHLORHEXIDINE GLUCONATE 0.12 % MT SOLN
OROMUCOSAL | Status: AC
  Administered 2022-05-31: 15 mL via OROMUCOSAL
  Filled 2022-05-31: qty 15

## 2022-05-31 MED ORDER — OXYCODONE HCL 5 MG PO TABS: 5.0000 mg | ORAL_TABLET | ORAL | 0 refills | Status: AC | PRN

## 2022-05-31 MED ORDER — ORAL CARE MOUTH RINSE: 15.0000 mL | Freq: Once | OROMUCOSAL | Status: AC

## 2022-05-31 MED ORDER — LACTATED RINGERS IV SOLN: INTRAVENOUS | Status: DC

## 2022-05-31 SURGICAL SUPPLY — 73 items
ALCOHOL 70% 16 OZ (MISCELLANEOUS) ×2 IMPLANT
APL PRP STRL LF DISP 70% ISPRP (MISCELLANEOUS) ×4
BAG COUNTER SPONGE SURGICOUNT (BAG) ×2 IMPLANT
BAG SPNG CNTER NS LX DISP (BAG) ×2
BANDAGE ESMARK 6X9 LF (GAUZE/BANDAGES/DRESSINGS) IMPLANT
BIT DRILL 2 CANN GRADUATED (BIT) IMPLANT
BIT DRILL 2.5 CANN STRL (BIT) IMPLANT
BIT DRILL 3 CANN ENDOSCOPIC (BIT) IMPLANT
BLADE SURG 15 STRL LF DISP TIS (BLADE) ×2 IMPLANT
BLADE SURG 15 STRL SS (BLADE) ×2
BNDG CMPR 9X6 STRL LF SNTH (GAUZE/BANDAGES/DRESSINGS)
BNDG CMPR MED 10X6 ELC LF (GAUZE/BANDAGES/DRESSINGS) ×2
BNDG COHESIVE 4X5 TAN STRL (GAUZE/BANDAGES/DRESSINGS) IMPLANT
BNDG COHESIVE 6X5 TAN STRL LF (GAUZE/BANDAGES/DRESSINGS) IMPLANT
BNDG ELASTIC 6X10 VLCR STRL LF (GAUZE/BANDAGES/DRESSINGS) ×2 IMPLANT
BNDG ESMARK 6X9 LF (GAUZE/BANDAGES/DRESSINGS)
CANISTER SUCT 3000ML PPV (MISCELLANEOUS) ×2 IMPLANT
CHLORAPREP W/TINT 26 (MISCELLANEOUS) ×4 IMPLANT
COVER SURGICAL LIGHT HANDLE (MISCELLANEOUS) ×2 IMPLANT
CUFF TOURN SGL QUICK 34 (TOURNIQUET CUFF) ×2
CUFF TOURN SGL QUICK 42 (TOURNIQUET CUFF) IMPLANT
CUFF TRNQT CYL 34X4.125X (TOURNIQUET CUFF) ×2 IMPLANT
DRAPE C-ARM 42X72 X-RAY (DRAPES) IMPLANT
DRAPE C-ARMOR (DRAPES) IMPLANT
DRAPE OEC MINIVIEW 54X84 (DRAPES) ×2 IMPLANT
DRAPE U-SHAPE 47X51 STRL (DRAPES) ×2 IMPLANT
DRSG MEPITEL 4X7.2 (GAUZE/BANDAGES/DRESSINGS) ×2 IMPLANT
DRSG XEROFORM 1X8 (GAUZE/BANDAGES/DRESSINGS) ×2 IMPLANT
ELECT REM PT RETURN 9FT ADLT (ELECTROSURGICAL) ×2
ELECTRODE REM PT RTRN 9FT ADLT (ELECTROSURGICAL) ×2 IMPLANT
GAUZE PAD ABD 8X10 STRL (GAUZE/BANDAGES/DRESSINGS) ×4 IMPLANT
GAUZE SPONGE 4X4 12PLY STRL (GAUZE/BANDAGES/DRESSINGS) IMPLANT
GAUZE SPONGE 4X4 12PLY STRL LF (GAUZE/BANDAGES/DRESSINGS) ×2 IMPLANT
GAUZE XEROFORM 1X8 LF (GAUZE/BANDAGES/DRESSINGS) IMPLANT
GLOVE BIOGEL M STRL SZ7.5 (GLOVE) ×2 IMPLANT
GLOVE BIOGEL PI IND STRL 8 (GLOVE) ×2 IMPLANT
GLOVE SRG 8 PF TXTR STRL LF DI (GLOVE) ×2 IMPLANT
GLOVE SURG ENC TEXT LTX SZ7.5 (GLOVE) ×2 IMPLANT
GLOVE SURG UNDER POLY LF SZ8 (GLOVE) ×2
GOWN STRL REUS W/ TWL LRG LVL3 (GOWN DISPOSABLE) ×2 IMPLANT
GOWN STRL REUS W/ TWL XL LVL3 (GOWN DISPOSABLE) ×4 IMPLANT
GOWN STRL REUS W/TWL LRG LVL3 (GOWN DISPOSABLE) ×2
GOWN STRL REUS W/TWL XL LVL3 (GOWN DISPOSABLE) ×4
KIT BASIN OR (CUSTOM PROCEDURE TRAY) ×2 IMPLANT
KIT TURNOVER KIT B (KITS) ×2 IMPLANT
NS IRRIG 1000ML POUR BTL (IV SOLUTION) ×2 IMPLANT
PACK ORTHO EXTREMITY (CUSTOM PROCEDURE TRAY) ×2 IMPLANT
PAD ARMBOARD 7.5X6 YLW CONV (MISCELLANEOUS) ×4 IMPLANT
PAD CAST 4YDX4 CTTN HI CHSV (CAST SUPPLIES) ×2 IMPLANT
PADDING CAST 4 STERILE IMPLANT
PADDING CAST COTTON 4X4 STRL (CAST SUPPLIES) ×2
PADDING WEBRIL 4 STERILE (GAUZE/BANDAGES/DRESSINGS) IMPLANT
PLATE LOCK DIST FIB RT 5H (Plate) IMPLANT
SCREW CORT FT ANKLE 3.5X54 (Screw) IMPLANT
SCREW CORT FT LP 3.5X58 (Screw) IMPLANT
SCREW CORTICAL 3MMX16MM (Screw) IMPLANT
SCREW CORTICAL 3MMX18MM (Screw) IMPLANT
SCREW LOCK COMP 3X16 (Screw) IMPLANT
SCREW LP TI 3.5X14MM (Screw) IMPLANT
SCREW VAL KREULOCK 3.0X12 TI (Screw) IMPLANT
SCREW VAL KREULOCK 3.0X18 TI (Screw) IMPLANT
SPLINT PLASTER 5X30 (CAST SUPPLIES) IMPLANT
SPONGE T-LAP 18X18 ~~LOC~~+RFID (SPONGE) ×2 IMPLANT
STOCKINETTE IMPLANT
SUCTION FRAZIER HANDLE 10FR (MISCELLANEOUS) ×2
SUCTION TUBE FRAZIER 10FR DISP (MISCELLANEOUS) ×2 IMPLANT
SUT ETHILON 3 0 PS 1 (SUTURE) ×2 IMPLANT
SUT MNCRL AB 3-0 PS2 27 (SUTURE) ×2 IMPLANT
SUT VIC AB 2-0 CT1 27 (SUTURE) ×4
SUT VIC AB 2-0 CT1 TAPERPNT 27 (SUTURE) ×4 IMPLANT
TOWEL GREEN STERILE (TOWEL DISPOSABLE) ×2 IMPLANT
TOWEL GREEN STERILE FF (TOWEL DISPOSABLE) ×2 IMPLANT
TUBE CONNECTING 12X1/4 (SUCTIONS) ×2 IMPLANT

## 2022-05-31 NOTE — Discharge Instructions (Signed)

## 2022-05-31 NOTE — Anesthesia Procedure Notes (Signed)
Anesthesia Regional Block: Popliteal block   Pre-Anesthetic Checklist: , timeout performed,  Correct Patient, Correct Site, Correct Laterality,  Correct Procedure, Correct Position, site marked,  Risks and benefits discussed,  Pre-op evaluation,  At surgeon's request and post-op pain management  Laterality: Lower and Right  Prep: Maximum Sterile Barrier Precautions used, chloraprep       Needles:  Injection technique: Single-shot  Needle Type: Echogenic Needle     Needle Length: 9cm  Needle Gauge: 21     Additional Needles:   Procedures:,,,, ultrasound used (permanent image in chart),,    Narrative:  Start time: 05/31/2022 11:51 AM End time: 05/31/2022 11:55 AM Injection made incrementally with aspirations every 5 mL.  Performed by: Personally  Anesthesiologist: Trevor Iha, MD  Additional Notes: Block assessed. Patient tolerated procedure well.

## 2022-05-31 NOTE — H&P (Signed)
PREOPERATIVE H&P  Chief Complaint: Right bimalleolar ankle fracture  HPI: Andre [name]. is a 56 y.o. male who presents for preoperative history and physical with a diagnosis of right bimalleolar ankle fracture.  Sustained this approximately 2 weeks ago after slip and fall.  He had displaced fibula fracture and evidence of posterior malleolar fracture.  There was concern for syndesmosis injury.  He is here today for surgical fixation.. Symptoms are rated as moderate to severe, and have been worsening.  This is significantly impairing activities of daily living.  He has elected for surgical management.   Past Medical History:  Diagnosis Date   CHF (congestive heart failure) (HCC)    Diabetes mellitus without complication (HCC)    Gout    Hypertension    LBBB (left bundle branch block)    Non-ischemic cardiomyopathy (HCC)    a. s/p STJ CRTD   PVC (premature ventricular contraction)    Past Surgical History:  Procedure Laterality Date   APPENDECTOMY  2011   CHOLECYSTECTOMY     FRACTURE SURGERY Right 2007   Plate and screws in arm   HIP SURGERY     IMPLANTABLE CARDIOVERTER DEFIBRILLATOR IMPLANT N/A 12/30/2014   STJ CRTD implanted by Dr Ladona Ridgel   WRIST SURGERY Right 2010   Thumb   Social History   Socioeconomic History   Marital status: Married    Spouse name: Marchelle Folks   Number of children: Not on file   Years of education: Not on file   Highest education level: Not on file  Occupational History   Not on file  Tobacco Use   Smoking status: Never   Smokeless tobacco: Never  Vaping Use   Vaping Use: Never used  Substance and Sexual Activity   Alcohol use: Yes    Comment: social   Drug use: No   Sexual activity: Not Currently  Other Topics Concern   Not on file  Social History Narrative   Not on file   Social Determinants of Health   Financial Resource Strain: Not on file  Food Insecurity: Not on file  Transportation Needs: Not on file  Physical Activity: Not on  file  Stress: Not on file  Social Connections: Not on file   Family History  Problem Relation Age of Onset   Healthy Mother    Heart attack Father    Hypertension Father    Depression Sister    Alcohol abuse Maternal Grandmother    Alcohol abuse Maternal Grandfather    No Known Allergies Prior to Admission medications   Medication Sig Start Date End Date Taking? Authorizing Provider  acetaminophen (TYLENOL) 325 MG tablet Take 650 mg by mouth 3 (three) times daily as needed for mild pain or moderate pain.   Yes [provider]  allopurinol (ZYLOPRIM) 300 MG tablet TAKE 1 TABLET TWICE A DAY 08/21/21  Yes Monica Becton, MD  ASPIRIN LOW DOSE 81 MG EC tablet TAKE 1 TABLET DAILY 09/19/21  Yes Monica Becton, MD  carvedilol (COREG) 25 MG tablet Take 1 tablet (25 mg total) by mouth 2 (two) times daily with a meal. 07/14/21 05/29/22 Yes Monica Becton, MD  clotrimazole-betamethasone (LOTRISONE) cream Apply 1 application topically 2 (two) times daily. Patient taking differently: Apply 1 application  topically daily as needed (Rash). 12/08/19  Yes Monica Becton, MD  Empagliflozin-metFORMIN HCl ER (SYNJARDY XR) 25-1000 MG TB24 Take 1 tablet by mouth daily. 10/17/21  Yes Monica Becton, MD  fluticasone (FLONASE) 50  MCG/ACT nasal spray Place 1 spray into both nostrils daily as needed for allergies or rhinitis.   Yes [provider]  furosemide (LASIX) 40 MG tablet TAKE ONE TABLET BY MOUTH TWICE A WEEK ON MONDAYS AND THURSDAYS 04/05/21  Yes Ashley Royalty, [place], DO  glipiZIDE (GLUCOTROL XL) 10 MG 24 hr tablet TAKE 2 TABLETS DAILY WITH BREAKFAST Patient taking differently: Take 10 mg by mouth 2 (two) times daily. 07/14/21  Yes Monica Becton, MD  HYDROcodone-acetaminophen (NORCO/VICODIN) 5-325 MG tablet Take 1-2 tablets by mouth every 6 (six) hours as needed. Patient taking differently: Take 1-2 tablets by mouth every 6 (six) hours as needed for  severe pain or moderate pain. 05/15/22  Yes Trevor Iha, FNP  levothyroxine (SYNTHROID) 50 MCG tablet TAKE 1 TABLET DAILY BEFORE BREAKFAST 03/26/22  Yes Monica Becton, MD  losartan (COZAAR) 25 MG tablet Take 1 tablet (25 mg total) by mouth daily. 03/09/22 06/07/22 Yes Sheilah Pigeon, PA-C  magnesium oxide (MAG-OX) 400 MG tablet Take 2 tablets (800 mg total) by mouth at bedtime. 07/14/21  Yes Monica Becton, MD  Semaglutide (RYBELSUS) 14 MG TABS Take 1 tablet (14 mg total) by mouth daily. 02/06/22  Yes Monica Becton, MD  spironolactone (ALDACTONE) 25 MG tablet TAKE 1 TABLET TWICE A DAY 08/21/21  Yes Monica Becton, MD  AMBULATORY NON FORMULARY MEDICATION Single glucometer with lancets, test strips, testing 3 times daily 02/02/19   Monica Becton, MD  Continuous Blood Gluc Receiver (DEXCOM G6 RECEIVER) DEVI 1 each by Does not apply route daily. 02/02/19   Monica Becton, MD  Continuous Blood Gluc Transmit (DEXCOM G6 TRANSMITTER) MISC 1 each by Does not apply route daily. 02/02/19   Monica Becton, MD  ibuprofen (ADVIL) 400 MG tablet Take 400 mg by mouth every 6 (six) hours as needed for moderate pain or mild pain.    [provider]  triamcinolone (KENALOG) 0.025 % cream Apply 1 application topically 2 (two) times daily. To left upper eyelid Patient taking differently: Apply 1 application  topically daily as needed (rash). To left upper eyelid 08/25/21   Monica Becton, MD     Positive ROS: All other systems have been reviewed and were otherwise negative with the exception of those mentioned in the HPI and as above.  Physical Exam:  There were no vitals filed for this visit. General: Alert, no acute distress Cardiovascular: No pedal edema Respiratory: No cyanosis, no use of accessory musculature GI: No organomegaly, abdomen is soft and non-tender Skin: No lesions in the area of chief complaint Neurologic: Sensation intact  distally Psychiatric: Patient is competent for consent with normal mood and affect Lymphatic: No axillary or cervical lymphadenopathy  MUSCULOSKELETAL: Right leg in a short leg splint.  Mild swelling to the dorsal foot.  Sensation grossly intact to light touch.  No tenderness proximal to the splint.  Foot is warm and well-perfused.  Assessment: Right bimalleolar ankle fracture with possible syndesmotic disruption   Plan: Plan for open treatment of his bimalleolar fracture with possible syndesmosis.  Patient does have diabetes and heart failure.  He understands the risks of wound healing problems, infection and nonunion due to these diagnoses.  He wishes to proceed.  We discussed the risks, benefits and alternatives of surgery which include but are not limited to wound healing complications, infection, nonunion, malunion, need for further surgery, damage to surrounding structures and continued pain.  They understand there is no guarantees to an acceptable outcome.  After weighing these risks they opted to proceed with surgery.     Terance Hart, MD    05/31/2022 11:32 AM

## 2022-05-31 NOTE — Anesthesia Procedure Notes (Signed)
Procedure Name: MAC Date/Time: 05/31/2022 12:30 PM  Performed by: Harden Mo, CRNAPre-anesthesia Checklist: Patient identified, Emergency Drugs available, Suction available and Patient being monitored Patient Re-evaluated:Patient Re-evaluated prior to induction Oxygen Delivery Method: Simple face mask Preoxygenation: Pre-oxygenation with 100% oxygen Induction Type: IV induction Placement Confirmation: positive ETCO2 and breath sounds checked- equal and bilateral Dental Injury: Teeth and Oropharynx as per pre-operative assessment

## 2022-05-31 NOTE — Transfer of Care (Signed)
Immediate Anesthesia Transfer of Care Note  Patient: Andre [name].  Procedure(s) Performed: OPEN TREATMENT RIGHT BIMALLEOLAR ANKLE FRACTURE (Right: Ankle) POSSIBLE OPEN TREATMENT OF SYNDESMOSIS (Right)  Patient Location: PACU  Anesthesia Type:MAC and Regional  Level of Consciousness: awake, alert  and oriented  Airway & Oxygen Therapy: Patient Spontanous Breathing  Post-op Assessment: Report given to RN and Post -op Vital signs reviewed and stable  Post vital signs: Reviewed and stable  Last Vitals:  Vitals Value Taken Time  BP 114/81 05/31/22 1352  Temp    Pulse 89 05/31/22 1353  Resp 13 05/31/22 1353  SpO2 91 % 05/31/22 1353  Vitals shown include unvalidated device data.  Last Pain:  Vitals:   05/31/22 1201  PainSc: 0-No pain         Complications: No notable events documented.

## 2022-05-31 NOTE — Anesthesia Procedure Notes (Signed)
Anesthesia Regional Block: Adductor canal block   Pre-Anesthetic Checklist: , timeout performed,  Correct Patient, Correct Site, Correct Laterality,  Correct Procedure, Correct Position, site marked,  Risks and benefits discussed,  Surgical consent,  Pre-op evaluation,  At surgeon's request and post-op pain management  Laterality: Lower and Right  Prep: chloraprep       Needles:  Injection technique: Single-shot  Needle Type: Echogenic Needle     Needle Length: 9cm  Needle Gauge: 22     Additional Needles:   Procedures:,,,, ultrasound used (permanent image in chart),,    Narrative:  Start time: 05/31/2022 11:56 AM End time: 05/31/2022 11:59 AM Injection made incrementally with aspirations every 5 mL.  Performed by: Personally  Anesthesiologist: Trevor Iha, MD  Additional Notes: Block assessed prior to surgery. Pt tolerated procedure well.
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
## 2022-06-03 NOTE — Anesthesia Postprocedure Evaluation (Signed)
Anesthesia Post Note  Patient: Andre [name].  Procedure(s) Performed: OPEN TREATMENT RIGHT BIMALLEOLAR ANKLE FRACTURE (Right: Ankle) POSSIBLE OPEN TREATMENT OF SYNDESMOSIS (Right)     Patient location during evaluation: PACU Anesthesia Type: Regional and MAC Level of consciousness: awake Pain management: pain level controlled Vital Signs Assessment: post-procedure vital signs reviewed and stable Respiratory status: spontaneous breathing Cardiovascular status: stable Postop Assessment: no apparent nausea or vomiting Anesthetic complications: no   No notable events documented.  Last Vitals:  Vitals:   05/31/22 1410 05/31/22 1420  BP: 112/88 124/87  Pulse: 76 71  Resp: 12 13  Temp:  36.6 C  SpO2: 94% 96%    Last Pain:  Vitals:   05/31/22 1420  PainSc: 0-No pain                 John F Wylie Coon Jr

## 2022-06-04 ENCOUNTER — Encounter (HOSPITAL_COMMUNITY): Payer: Self-pay | Admitting: Orthopaedic Surgery

## 2022-06-08 ENCOUNTER — Ambulatory Visit (INDEPENDENT_AMBULATORY_CARE_PROVIDER_SITE_OTHER): Payer: Managed Care, Other (non HMO)

## 2022-06-08 ENCOUNTER — Ambulatory Visit: Payer: Managed Care, Other (non HMO)

## 2022-06-08 DIAGNOSIS — I428 Other cardiomyopathies: Secondary | ICD-10-CM

## 2022-06-09 LAB — CUP PACEART REMOTE DEVICE CHECK
Battery Remaining Longevity: 5 mo
Battery Remaining Percentage: 6 %
Battery Voltage: 2.63 V
Brady Statistic AP VP Percent: 7.9 %
Brady Statistic AP VS Percent: 1 %
Brady Statistic AS VP Percent: 91 %
Brady Statistic AS VS Percent: 1 %
Brady Statistic RA Percent Paced: 7.3 %
Date Time Interrogation Session: 20230915020016
HighPow Impedance: 92 Ohm
HighPow Impedance: 92 Ohm
Implantable Lead Implant Date: 20160407
Implantable Lead Implant Date: 20160407
Implantable Lead Implant Date: 20160407
Implantable Lead Location: 753858
Implantable Lead Location: 753859
Implantable Lead Location: 753860
Implantable Lead Model: 7122
Implantable Pulse Generator Implant Date: 20160407
Lead Channel Impedance Value: 380 Ohm
Lead Channel Impedance Value: 450 Ohm
Lead Channel Impedance Value: 600 Ohm
Lead Channel Pacing Threshold Amplitude: 0.75 V
Lead Channel Pacing Threshold Amplitude: 1 V
Lead Channel Pacing Threshold Amplitude: 2 V
Lead Channel Pacing Threshold Pulse Width: 0.4 ms
Lead Channel Pacing Threshold Pulse Width: 0.4 ms
Lead Channel Pacing Threshold Pulse Width: 0.5 ms
Lead Channel Sensing Intrinsic Amplitude: 0.9 mV
Lead Channel Sensing Intrinsic Amplitude: 12 mV
Lead Channel Setting Pacing Amplitude: 2 V
Lead Channel Setting Pacing Amplitude: 2.5 V
Lead Channel Setting Pacing Amplitude: 2.5 V
Lead Channel Setting Pacing Pulse Width: 0.4 ms
Lead Channel Setting Pacing Pulse Width: 0.5 ms
Lead Channel Setting Sensing Sensitivity: 0.5 mV
Pulse Gen Serial Number: 7250204

## 2022-06-11 ENCOUNTER — Telehealth: Payer: Self-pay

## 2022-06-11 NOTE — Telephone Encounter (Signed)
I let the patient know that he has 5 months left on his battery. I let him know that we increased remote checks. The patient verbalized understanding

## 2022-06-11 NOTE — Telephone Encounter (Signed)
Called patient to advise increase in monthly battery checks.    Completed in [place].  No answer, LMTCB.

## 2022-06-22 NOTE — Progress Notes (Signed)
Remote ICD transmission.   

## 2022-06-25 ENCOUNTER — Encounter: Payer: Self-pay | Admitting: Internal Medicine

## 2022-06-26 ENCOUNTER — Encounter: Payer: Self-pay | Admitting: Sports Medicine

## 2022-07-11 ENCOUNTER — Other Ambulatory Visit: Payer: Self-pay | Admitting: Sports Medicine

## 2022-07-11 DIAGNOSIS — E119 Type 2 diabetes mellitus without complications: Secondary | ICD-10-CM

## 2022-07-19 ENCOUNTER — Encounter: Payer: Managed Care, Other (non HMO) | Admitting: Internal Medicine

## 2022-07-24 ENCOUNTER — Encounter: Payer: Managed Care, Other (non HMO) | Admitting: Internal Medicine

## 2022-09-03 ENCOUNTER — Encounter: Payer: Managed Care, Other (non HMO) | Admitting: Internal Medicine

## 2022-09-04 ENCOUNTER — Other Ambulatory Visit: Payer: Self-pay | Admitting: Sports Medicine

## 2022-09-06 LAB — HM DIABETES EYE EXAM

## 2022-09-07 ENCOUNTER — Ambulatory Visit: Payer: Managed Care, Other (non HMO)

## 2022-09-07 DIAGNOSIS — I428 Other cardiomyopathies: Secondary | ICD-10-CM | POA: Diagnosis not present

## 2022-09-07 LAB — CUP PACEART REMOTE DEVICE CHECK
Battery Remaining Longevity: 3 mo
Battery Remaining Percentage: 3 %
Battery Voltage: 2.62 V
Brady Statistic AP VP Percent: 7.5 %
Brady Statistic AP VS Percent: 1 %
Brady Statistic AS VP Percent: 91 %
Brady Statistic AS VS Percent: 1 %
Brady Statistic RA Percent Paced: 7 %
Date Time Interrogation Session: 20231215020017
HighPow Impedance: 87 Ohm
HighPow Impedance: 87 Ohm
Implantable Lead Connection Status: 753985
Implantable Lead Connection Status: 753985
Implantable Lead Connection Status: 753985
Implantable Lead Implant Date: 20160407
Implantable Lead Implant Date: 20160407
Implantable Lead Implant Date: 20160407
Implantable Lead Location: 753858
Implantable Lead Location: 753859
Implantable Lead Location: 753860
Implantable Lead Model: 7122
Implantable Pulse Generator Implant Date: 20160407
Lead Channel Impedance Value: 380 Ohm
Lead Channel Impedance Value: 440 Ohm
Lead Channel Impedance Value: 530 Ohm
Lead Channel Pacing Threshold Amplitude: 0.75 V
Lead Channel Pacing Threshold Amplitude: 1 V
Lead Channel Pacing Threshold Amplitude: 2 V
Lead Channel Pacing Threshold Pulse Width: 0.4 ms
Lead Channel Pacing Threshold Pulse Width: 0.4 ms
Lead Channel Pacing Threshold Pulse Width: 0.5 ms
Lead Channel Sensing Intrinsic Amplitude: 1.4 mV
Lead Channel Sensing Intrinsic Amplitude: 12 mV
Lead Channel Setting Pacing Amplitude: 2 V
Lead Channel Setting Pacing Amplitude: 2.5 V
Lead Channel Setting Pacing Amplitude: 2.5 V
Lead Channel Setting Pacing Pulse Width: 0.4 ms
Lead Channel Setting Pacing Pulse Width: 0.5 ms
Lead Channel Setting Sensing Sensitivity: 0.5 mV
Pulse Gen Serial Number: 7250204
Zone Setting Status: 755011

## 2022-09-21 ENCOUNTER — Telehealth: Payer: Self-pay

## 2022-09-21 NOTE — Telephone Encounter (Signed)
Patient left VM and needs refill on allopurinol  he is out please advise .

## 2022-09-24 MED ORDER — ALLOPURINOL 300 MG PO TABS: 300.0000 mg | ORAL_TABLET | Freq: Two times a day (BID) | ORAL | 3 refills | Status: DC

## 2022-09-24 NOTE — Addendum Note (Signed)
Addended by: Silverio Decamp on: 09/24/2022 05:47 PM   Modules accepted: Orders

## 2022-09-24 NOTE — Telephone Encounter (Signed)
Refilled

## 2022-09-25 ENCOUNTER — Encounter: Payer: Self-pay | Admitting: Sports Medicine

## 2022-09-26 ENCOUNTER — Other Ambulatory Visit: Payer: Self-pay

## 2022-09-26 MED ORDER — ALLOPURINOL 300 MG PO TABS: 300.0000 mg | ORAL_TABLET | Freq: Two times a day (BID) | ORAL | 1 refills | Status: DC

## 2022-09-27 NOTE — Progress Notes (Signed)
Remote ICD transmission.   

## 2022-10-01 DIAGNOSIS — I447 Left bundle-branch block, unspecified: Secondary | ICD-10-CM | POA: Insufficient documentation

## 2022-10-01 DIAGNOSIS — Z950 Presence of cardiac pacemaker: Secondary | ICD-10-CM | POA: Insufficient documentation

## 2022-10-01 NOTE — Progress Notes (Unsigned)
Cardiology Office Note Date:  10/02/2022  Patient ID:  Andre Colquhoun., DOB 05/26/1966, MRN [id number] PCP:  Silverio Decamp, MD  Cardiologist:  Dorris Carnes, MD Electrophysiologist: Cristopher Peru, MD    Chief Complaint: 33mon device check, nearing ERI  History of Present Illness: Andre Boomhower. is a 57 y.o. male with PMH notable for LBBB, NICM, HFrEF s/p ICD, HTN; seen today for Cristopher Peru, MD for routine electrophysiology followup. Since last being seen in our clinic the patient reports doing really well.   Has not been seen by Dr. Lovena Le since 2019.   Last saw PA Charlcie Cradle 02/2022, feeling well, nearing device gen change, rec updated echo. Stopped amlodipine & lisinopril, started losartan. Device had remote noise reversion on the A-lead, not reproducible.  Today, he has lost ~45lbs with medications, still enjoys eating cheeseburgers at times. He has recently had R ankle surgery and has some swelling in that area. Works for a [employer], 12h shifts on feet throughout. Has mild lower extremity swelling at the end of work shift, but resolves by the morning.    he denies chest pain, palpitations, dyspnea, PND, orthopnea, nausea, vomiting, dizziness, syncope, weight gain, or early satiety.    He has not seen general cardiology in quite a while. Follows regularly with PCP for DM mgmt.   Device Information: Abbott CRT-D, implant 12/2014; dx NICM, LBBB  Past Medical History:  Diagnosis Date   CHF (congestive heart failure) ([place])    Diabetes mellitus without complication (HCC)    Gout    Hypertension    LBBB (left bundle branch block)    Non-ischemic cardiomyopathy ([place])    a. s/p STJ CRTD   PVC (premature ventricular contraction)     Past Surgical History:  Procedure Laterality Date   APPENDECTOMY  2011   CHOLECYSTECTOMY     FRACTURE SURGERY Right 2007   Plate and screws in arm   HIP SURGERY     IMPLANTABLE CARDIOVERTER DEFIBRILLATOR IMPLANT N/A  12/30/2014   STJ CRTD implanted by Dr Lovena Le   ORIF ANKLE FRACTURE Right 05/31/2022   Procedure: OPEN TREATMENT RIGHT BIMALLEOLAR ANKLE FRACTURE;  Surgeon: Erle Crocker, MD;  Location: [place];  Service: Orthopedics;  Laterality: Right;  LENGTH OF SURGERY: 90 MINUTES   SYNDESMOSIS REPAIR Right 05/31/2022   Procedure: POSSIBLE OPEN TREATMENT OF SYNDESMOSIS;  Surgeon: Erle Crocker, MD;  Location: [place];  Service: Orthopedics;  Laterality: Right;   WRIST SURGERY Right 2010   Thumb    Current Outpatient Medications  Medication Instructions   acetaminophen (TYLENOL) 650 mg, Oral, 3 times daily PRN   allopurinol (ZYLOPRIM) 300 mg, Oral, 2 times daily   AMBULATORY NON FORMULARY MEDICATION Single glucometer with lancets, test strips, testing 3 times daily   aspirin EC 81 mg, Oral, Daily, Swallow whole.   carvedilol (COREG) 25 mg, Oral, 2 times daily with meals   clotrimazole-betamethasone (LOTRISONE) cream 1 application , Topical, 2 times daily   Continuous Blood Gluc Receiver (DEXCOM G6 RECEIVER) DEVI 1 each, Does not apply, Daily   Continuous Blood Gluc Transmit (DEXCOM G6 TRANSMITTER) MISC 1 each, Does not apply, Daily   Empagliflozin-metFORMIN HCl ER (SYNJARDY XR) 25-1000 MG TB24 1 tablet, Oral, Daily   fluticasone (FLONASE) 50 MCG/ACT nasal spray 1 spray, Each Nare, Daily PRN   furosemide (LASIX) 40 MG tablet TAKE ONE TABLET BY MOUTH TWICE A WEEK ON MONDAYS AND THURSDAYS   glipiZIDE (GLUCOTROL XL) 10 MG 24 hr  tablet TAKE 2 TABLETS DAILY WITH BREAKFAST   ibuprofen (ADVIL) 400 mg, Oral, Every 6 hours PRN   levothyroxine (SYNTHROID) 50 MCG tablet TAKE 1 TABLET DAILY BEFORE BREAKFAST   losartan (COZAAR) 25 mg, Oral, Daily   magnesium oxide (MAG-OX) 800 mg, Oral, Daily at bedtime   Rybelsus 14 mg, Oral, Daily   spironolactone (ALDACTONE) 25 MG tablet TAKE 1 TABLET TWICE A DAY   triamcinolone (KENALOG) 0.025 % cream 1 application , Topical, 2 times daily, To left upper eyelid       Social History:  The patient  reports that he has never smoked. He has never used smokeless tobacco. He reports current alcohol use. He reports that he does not use drugs.   Family History:   include only if pertinent The patient's family history includes Alcohol abuse in his maternal grandfather and maternal grandmother; Depression in his sister; Healthy in his mother; Heart attack in his father; Hypertension in his father.  ROS:  Please see the history of present illness. All other systems are reviewed and otherwise negative.   PHYSICAL EXAM:  VS:  BP 120/82   Pulse 74   Ht 5\' 10"  (1.778 m)   Wt 260 lb (117.9 kg)   SpO2 98%   BMI 37.31 kg/m  BMI: Body mass index is 37.31 kg/m.  GEN- The patient is well appearing, alert and oriented x 3 today.   HEENT: normocephalic, atraumatic; sclera clear, conjunctiva pink; hearing intact; oropharynx clear; neck supple, no JVP Lungs- Clear to ausculation bilaterally, normal work of breathing.  No wheezes, rales, rhonchi Heart- Regular rate and rhythm, no murmurs, rubs or gallops, PMI not laterally displaced GI- soft, non-tender, non-distended, bowel sounds present, no hepatosplenomegaly Extremities- No peripheral edema on LLE; 1+ edema on RLE. no clubbing or cyanosis; DP/PT/radial pulses 2+ bilaterally MS- no significant deformity or atrophy Skin- warm and dry, no rash or lesion,  device pocket well-healed Psych- euthymic mood, full affect Neuro- strength and sensation are intact   Device interrogation done today and reviewed by myself:  Battery nearing ERI Lead thresholds, impedence, sensing stable  No episodes No changes made today  EKG is ordered. Personal review of EKG from today shows:  A-sensed v-paced, rate 74bpm, LAD, Poor R-wave progression  Recent Labs: 05/31/2022: BUN 16; Creatinine, Ser 1.00; Hemoglobin 17.0; Potassium 4.2; Sodium 137  No results found for requested labs within last 365 days.   CrCl cannot be  calculated (Patient's most recent lab result is older than the maximum 21 days allowed.).   Wt Readings from Last 3 Encounters:  10/02/22 260 lb (117.9 kg)  05/31/22 260 lb (117.9 kg)  05/16/22 260 lb (117.9 kg)     Additional studies reviewed include: Previous EP, cardiology notes.   TTE 03/29/2022  1. Notable dyssynchrony. Left ventricular ejection fraction, by estimation, is 35 to 40%. The left ventricle has moderately decreased function. The left ventricle demonstrates global hypokinesis. There is mild concentric left ventricular hypertrophy. Left ventricular diastolic parameters are indeterminate. The average left ventricular global longitudinal strain is -16.5 %. The global longitudinal strain is abnormal.   2. Right ventricular systolic function is normal. The right ventricular size is mildly enlarged. There is normal pulmonary artery systolic pressure.   3. Left atrial size was mildly dilated.   4. The mitral valve is grossly normal. Trivial mitral valve regurgitation. No evidence of mitral stenosis.   5. The aortic valve is grossly normal. Aortic valve regurgitation is not visualized. No aortic stenosis  is present.   6. Aortic dilatation noted. There is mild dilatation of the ascending aorta, measuring 40 mm.   7. The inferior vena cava is normal in size with <50% respiratory variability, suggesting right atrial pressure of 8 mmHg.   Comparison(s): Prior images reviewed side by side. EF slightly worsened compared to prior, with significant LV dyssynchrony.   ASSESSMENT AND PLAN:  #) NICM, LBBB s/p CRT-D Set up for monthly remotes as nearing ERI Device functioning well, no changes Discussed risks/benefits of generator change procedure to include infection, bleeding. Patient verbalized understanding and wished to proceed once recommended.   #) HFrEF Euvolemic on exam with good exercise tolerance I will have him to re-establish with general cardiology to discuss titration of  medications to GDMT Taking coreg, spiro, losartan; lasix twice per week  #) HTN At goal today.  Recommend checking blood pressures 1-2 times per week at home and recording the values.  Recommend bringing these recordings to the primary care physician.   Current medicines are reviewed at length with the patient today.   The patient does not have concerns regarding his medicines.  The following changes were made today:  none  Labs/ tests ordered today include:  Orders Placed This Encounter  Procedures   EKG 12-Lead    Disposition: Follow up with Dr. Ladona Ridgel in as usual post procedure   Signed, Sherie Don, NP  10/02/22 11:46 AM   [facility] [address] [place] [address] [phone] (office)  [phone] (fax)

## 2022-10-02 ENCOUNTER — Encounter: Payer: Self-pay | Admitting: Student

## 2022-10-02 ENCOUNTER — Ambulatory Visit: Payer: Managed Care, Other (non HMO) | Attending: Student | Admitting: Cardiology

## 2022-10-02 VITALS — BP 120/82 | HR 74 | Ht 70.0 in | Wt 260.0 lb

## 2022-10-02 DIAGNOSIS — I447 Left bundle-branch block, unspecified: Secondary | ICD-10-CM | POA: Diagnosis not present

## 2022-10-02 DIAGNOSIS — Z950 Presence of cardiac pacemaker: Secondary | ICD-10-CM

## 2022-10-02 DIAGNOSIS — I5022 Chronic systolic (congestive) heart failure: Secondary | ICD-10-CM

## 2022-10-02 DIAGNOSIS — I10 Essential (primary) hypertension: Secondary | ICD-10-CM | POA: Diagnosis not present

## 2022-10-02 LAB — CUP PACEART INCLINIC DEVICE CHECK
Battery Remaining Longevity: 2 mo
Brady Statistic RA Percent Paced: 6.8 %
Brady Statistic RV Percent Paced: 99 %
Date Time Interrogation Session: 20240109123811
HighPow Impedance: 75.375
Implantable Lead Connection Status: 753985
Implantable Lead Connection Status: 753985
Implantable Lead Connection Status: 753985
Implantable Lead Implant Date: 20160407
Implantable Lead Implant Date: 20160407
Implantable Lead Implant Date: 20160407
Implantable Lead Location: 753858
Implantable Lead Location: 753859
Implantable Lead Location: 753860
Implantable Lead Model: 7122
Implantable Pulse Generator Implant Date: 20160407
Lead Channel Impedance Value: 462.5 Ohm
Lead Channel Impedance Value: 475 Ohm
Lead Channel Impedance Value: 600 Ohm
Lead Channel Pacing Threshold Amplitude: 0.75 V
Lead Channel Pacing Threshold Amplitude: 0.75 V
Lead Channel Pacing Threshold Amplitude: 1 V
Lead Channel Pacing Threshold Amplitude: 1 V
Lead Channel Pacing Threshold Amplitude: 1.375 V
Lead Channel Pacing Threshold Pulse Width: 0.4 ms
Lead Channel Pacing Threshold Pulse Width: 0.4 ms
Lead Channel Pacing Threshold Pulse Width: 0.4 ms
Lead Channel Pacing Threshold Pulse Width: 0.4 ms
Lead Channel Pacing Threshold Pulse Width: 0.5 ms
Lead Channel Sensing Intrinsic Amplitude: 1.5 mV
Lead Channel Sensing Intrinsic Amplitude: 12 mV
Lead Channel Setting Pacing Amplitude: 2 V
Lead Channel Setting Pacing Amplitude: 2.5 V
Lead Channel Setting Pacing Amplitude: 2.5 V
Lead Channel Setting Pacing Pulse Width: 0.4 ms
Lead Channel Setting Pacing Pulse Width: 0.5 ms
Lead Channel Setting Sensing Sensitivity: 0.5 mV
Pulse Gen Serial Number: 7250204
Zone Setting Status: 755011

## 2022-10-02 NOTE — Patient Instructions (Signed)
Medication Instructions:  Your physician recommends that you continue on your current medications as directed. Please refer to the Current Medication list given to you today.  *If you need a refill on your cardiac medications before your next appointment, please call your pharmacy*   Lab Work: None If you have labs (blood work) drawn today and your tests are completely normal, you will receive your results only by: [place] (if you have MyChart) OR A paper copy in the mail If you have any lab test that is abnormal or we need to change your treatment, we will call you to review the results.   Follow-Up: At [facility], you and your health needs are our priority.  As part of our continuing mission to provide you with exceptional heart care, we have created designated Provider Care Teams.  These Care Teams include your primary Cardiologist (physician) and Advanced Practice Providers (APPs -  Physician Assistants and Nurse Practitioners) who all work together to provide you with the care you need, when you need it.   Your next appointment:   Routine follow up after Pacemaker Generator Change

## 2022-10-06 ENCOUNTER — Other Ambulatory Visit: Payer: Self-pay | Admitting: Sports Medicine

## 2022-10-07 ENCOUNTER — Other Ambulatory Visit: Payer: Self-pay | Admitting: Sports Medicine

## 2022-10-07 DIAGNOSIS — E119 Type 2 diabetes mellitus without complications: Secondary | ICD-10-CM

## 2022-10-10 ENCOUNTER — Ambulatory Visit: Payer: Managed Care, Other (non HMO) | Admitting: Sports Medicine

## 2022-10-10 ENCOUNTER — Encounter: Payer: Self-pay | Admitting: Sports Medicine

## 2022-10-10 VITALS — BP 121/83 | HR 65 | Wt 260.0 lb

## 2022-10-10 DIAGNOSIS — Z23 Encounter for immunization: Secondary | ICD-10-CM | POA: Diagnosis not present

## 2022-10-10 DIAGNOSIS — E119 Type 2 diabetes mellitus without complications: Secondary | ICD-10-CM | POA: Diagnosis not present

## 2022-10-10 DIAGNOSIS — Z0000 Encounter for general adult medical examination without abnormal findings: Secondary | ICD-10-CM

## 2022-10-10 LAB — POCT GLYCOSYLATED HEMOGLOBIN (HGB A1C): Hemoglobin A1C: 6.1 % — AB (ref 4.0–5.6)

## 2022-10-10 MED ORDER — ROSUVASTATIN CALCIUM 5 MG PO TABS: 5.0000 mg | ORAL_TABLET | Freq: Every day | ORAL | 3 refills | Status: DC

## 2022-10-10 NOTE — Progress Notes (Signed)
    Procedures performed today:    None.  Independent interpretation of notes and tests performed by another provider:   None.  Brief History, Exam, Impression, and Recommendations:    Controlled type 2 diabetes mellitus without complication, without long-term current use of insulin (HCC) Andre [name] returns, he is a very pleasant 57 year old male, he is doing well on his current regimen, Synjardy and Rybelsus 14, glipizide 10, A1c is 6.1% today. Neuropathic symptoms have improved to some degree, they only occur at night, he can live with it for now. Adding low-dose Crestor, he will return for urine microalbumin with our lab. He recently had a diabetic eye exam prior to his cataract surgery.   Annual physical exam We will give him his flu and COVID shots today.  Chronic process not at goal with pharmacologic intervention  ____________________________________________ Gwen Her. Dianah Field, M.D., ABFM., CAQSM., AME. Primary Care and Sports Medicine [facility]  Adjunct Professor of [place] [employer] [facility] [employer] of Medicine  Risk manager

## 2022-10-10 NOTE — Assessment & Plan Note (Signed)
We will give him his flu and COVID shots today.

## 2022-10-10 NOTE — Assessment & Plan Note (Signed)
Andre [name] returns, he is a very pleasant 57 year old male, he is doing well on his current regimen, Synjardy and Rybelsus 14, glipizide 10, A1c is 6.1% today. Neuropathic symptoms have improved to some degree, they only occur at night, he can live with it for now. Adding low-dose Crestor, he will return for urine microalbumin with our lab. He recently had a diabetic eye exam prior to his cataract surgery.

## 2022-10-11 ENCOUNTER — Ambulatory Visit: Payer: Managed Care, Other (non HMO) | Attending: Internal Medicine

## 2022-10-16 LAB — CUP PACEART REMOTE DEVICE CHECK
Battery Remaining Longevity: 1 mo
Battery Remaining Percentage: 3 %
Battery Voltage: 2.6 V
Brady Statistic AP VP Percent: 7.4 %
Brady Statistic AP VS Percent: 1 %
Brady Statistic AS VP Percent: 92 %
Brady Statistic AS VS Percent: 1 %
Brady Statistic RA Percent Paced: 6.9 %
Date Time Interrogation Session: 20240122154106
HighPow Impedance: 73 Ohm
HighPow Impedance: 73 Ohm
Implantable Lead Connection Status: 753985
Implantable Lead Connection Status: 753985
Implantable Lead Connection Status: 753985
Implantable Lead Implant Date: 20160407
Implantable Lead Implant Date: 20160407
Implantable Lead Implant Date: 20160407
Implantable Lead Location: 753858
Implantable Lead Location: 753859
Implantable Lead Location: 753860
Implantable Lead Model: 7122
Implantable Pulse Generator Implant Date: 20160407
Lead Channel Impedance Value: 380 Ohm
Lead Channel Impedance Value: 440 Ohm
Lead Channel Impedance Value: 600 Ohm
Lead Channel Pacing Threshold Amplitude: 0.75 V
Lead Channel Pacing Threshold Amplitude: 1 V
Lead Channel Pacing Threshold Amplitude: 1.625 V
Lead Channel Pacing Threshold Pulse Width: 0.4 ms
Lead Channel Pacing Threshold Pulse Width: 0.4 ms
Lead Channel Pacing Threshold Pulse Width: 0.5 ms
Lead Channel Sensing Intrinsic Amplitude: 0.6 mV
Lead Channel Sensing Intrinsic Amplitude: 12 mV
Lead Channel Setting Pacing Amplitude: 2 V
Lead Channel Setting Pacing Amplitude: 2.5 V
Lead Channel Setting Pacing Amplitude: 2.5 V
Lead Channel Setting Pacing Pulse Width: 0.4 ms
Lead Channel Setting Pacing Pulse Width: 0.5 ms
Lead Channel Setting Sensing Sensitivity: 0.5 mV
Pulse Gen Serial Number: 7250204
Zone Setting Status: 755011

## 2022-10-25 ENCOUNTER — Telehealth: Payer: Self-pay

## 2022-10-25 NOTE — Telephone Encounter (Signed)
Alert remote reviewed. Normal device function.   The device reached the elective replacement indicator 10/25/2022, sent to triage Next remote 11/12/2022. Kathy Breach, RN, CCDS, [employer]  LM to notify patient device at [facility]. Forwarding to Dr. Lovena Le and his nurse to reach out and set up scheduling. Patient saw Mamie Levers, NP on 10/02/22, she addressed device nearing ERI.

## 2022-10-25 NOTE — Telephone Encounter (Signed)
I spoke with the patient and let him know that he reached ERI. I told him that the nurse sent a note to dr. Lovena Le and his nurse. Someone will be reaching out to him to set up the Gen change arraignments.

## 2022-10-28 ENCOUNTER — Encounter: Payer: Self-pay | Admitting: Sports Medicine

## 2022-10-28 DIAGNOSIS — E119 Type 2 diabetes mellitus without complications: Secondary | ICD-10-CM

## 2022-10-29 ENCOUNTER — Other Ambulatory Visit: Payer: Self-pay

## 2022-10-29 DIAGNOSIS — I428 Other cardiomyopathies: Secondary | ICD-10-CM

## 2022-10-29 MED ORDER — SPIRONOLACTONE 25 MG PO TABS: 25.0000 mg | ORAL_TABLET | Freq: Two times a day (BID) | ORAL | 1 refills | Status: DC

## 2022-10-29 MED ORDER — ROSUVASTATIN CALCIUM 5 MG PO TABS: 5.0000 mg | ORAL_TABLET | Freq: Every day | ORAL | 3 refills | Status: DC

## 2022-10-29 NOTE — Telephone Encounter (Signed)
Pt is scheduled for 12/17/22 at 7:30am..he will have labs done on 11/27/22 and pick up his soap scrub while he's here for that appt.   Pt has been instructed to hold Semaglutide tablets for 24 hours prior to procedure per Pharm D Megan Supple.

## 2022-11-09 ENCOUNTER — Encounter: Payer: Self-pay | Admitting: Internal Medicine

## 2022-11-12 ENCOUNTER — Other Ambulatory Visit: Payer: Self-pay | Admitting: Physician Assistant

## 2022-11-12 ENCOUNTER — Ambulatory Visit (INDEPENDENT_AMBULATORY_CARE_PROVIDER_SITE_OTHER): Payer: Managed Care, Other (non HMO)

## 2022-11-12 DIAGNOSIS — I428 Other cardiomyopathies: Secondary | ICD-10-CM

## 2022-11-12 LAB — CUP PACEART REMOTE DEVICE CHECK
Battery Remaining Longevity: 0 mo
Battery Voltage: 2.59 V
Brady Statistic AP VP Percent: 7.3 %
Brady Statistic AP VS Percent: 1 %
Brady Statistic AS VP Percent: 92 %
Brady Statistic AS VS Percent: 1 %
Brady Statistic RA Percent Paced: 6.8 %
Date Time Interrogation Session: 20240219064641
HighPow Impedance: 79 Ohm
HighPow Impedance: 79 Ohm
Implantable Lead Connection Status: 753985
Implantable Lead Connection Status: 753985
Implantable Lead Connection Status: 753985
Implantable Lead Implant Date: 20160407
Implantable Lead Implant Date: 20160407
Implantable Lead Implant Date: 20160407
Implantable Lead Location: 753858
Implantable Lead Location: 753859
Implantable Lead Location: 753860
Implantable Lead Model: 7122
Implantable Pulse Generator Implant Date: 20160407
Lead Channel Impedance Value: 460 Ohm
Lead Channel Impedance Value: 510 Ohm
Lead Channel Impedance Value: 630 Ohm
Lead Channel Pacing Threshold Amplitude: 0.75 V
Lead Channel Pacing Threshold Amplitude: 1 V
Lead Channel Pacing Threshold Amplitude: 1.25 V
Lead Channel Pacing Threshold Pulse Width: 0.4 ms
Lead Channel Pacing Threshold Pulse Width: 0.4 ms
Lead Channel Pacing Threshold Pulse Width: 0.5 ms
Lead Channel Sensing Intrinsic Amplitude: 1.6 mV
Lead Channel Sensing Intrinsic Amplitude: 12 mV
Lead Channel Setting Pacing Amplitude: 2 V
Lead Channel Setting Pacing Amplitude: 2.5 V
Lead Channel Setting Pacing Amplitude: 2.5 V
Lead Channel Setting Pacing Pulse Width: 0.4 ms
Lead Channel Setting Pacing Pulse Width: 0.5 ms
Lead Channel Setting Sensing Sensitivity: 0.5 mV
Pulse Gen Serial Number: 7250204
Zone Setting Status: 755011

## 2022-11-13 ENCOUNTER — Encounter: Payer: Self-pay | Admitting: Internal Medicine

## 2022-11-13 ENCOUNTER — Ambulatory Visit: Payer: Managed Care, Other (non HMO) | Attending: Internal Medicine | Admitting: Internal Medicine

## 2022-11-13 VITALS — BP 116/80 | HR 76 | Ht 70.0 in | Wt 263.0 lb

## 2022-11-13 DIAGNOSIS — I428 Other cardiomyopathies: Secondary | ICD-10-CM

## 2022-11-13 NOTE — Patient Instructions (Signed)
Medication Instructions:   *If you need a refill on your cardiac medications before your next appointment, please call your pharmacy*   Lab Work:  If you have labs (blood work) drawn today and your tests are completely normal, you will receive your results only by: [place] (if you have MyChart) OR A paper copy in the mail If you have any lab test that is abnormal or we need to change your treatment, we will call you to review the results.   Testing/Procedures:    Follow-Up: At [facility], you and your health needs are our priority.  As part of our continuing mission to provide you with exceptional heart care, we have created designated Provider Care Teams.  These Care Teams include your primary Cardiologist (physician) and Advanced Practice Providers (APPs -  Physician Assistants and Nurse Practitioners) who all work together to provide you with the care you need, when you need it.  We recommend signing up for the patient portal called "MyChart".  Sign up information is provided on this After Visit Summary.  MyChart is used to connect with patients for Virtual Visits (Telemedicine).  Patients are able to view lab/test results, encounter notes, upcoming appointments, etc.  Non-urgent messages can be sent to your provider as well.   To learn more about what you can do with MyChart, go to [email or web address].

## 2022-11-13 NOTE — Progress Notes (Signed)
Cardiology Office Note   Date:  11/13/2022   ID:  Andre Kobza., DOB 10-13-1965, MRN [id number]  PCP:  Silverio Decamp, MD  Cardiologist:   Dorris Carnes, MD   Patient presents for follow up of NICM/HFrEF   History of Present Illness: Andre Decaro. is a 57 y.o. male with a history of HFrEF (nonischemic), LBBB, HTN  s/p CRT-D implant in [place] 2016   I saw him in 2016   He has been followed by EP service since. Echo in June 2023 LVEF 35 to 40% HE was last seen by Andre [name] in January 2024   Device is approaching ERI  The pt says his breathing is good He denies CP  No edema   No PND   works at [employer]   walking all day     [employer] down about 40#   Current Meds  Medication Sig   acetaminophen (TYLENOL) 325 MG tablet Take 650 mg by mouth 3 (three) times daily as needed for mild pain or moderate pain.   allopurinol (ZYLOPRIM) 300 MG tablet Take 1 tablet (300 mg total) by mouth 2 (two) times daily.   aspirin EC 81 MG tablet Take 81 mg by mouth daily. Swallow whole.   carvedilol (COREG) 25 MG tablet TAKE 1 TABLET TWICE A DAY WITH MEALS   clotrimazole-betamethasone (LOTRISONE) cream Apply 1 application topically 2 (two) times daily. (Patient taking differently: Apply 1 application  topically daily as needed (Rash).)   Empagliflozin-metFORMIN HCl ER (SYNJARDY XR) 25-1000 MG TB24 TAKE 1 TABLET DAILY   fluticasone (FLONASE) 50 MCG/ACT nasal spray Place 1 spray into both nostrils daily as needed for allergies or rhinitis.   furosemide (LASIX) 40 MG tablet TAKE ONE TABLET BY MOUTH TWICE A WEEK ON MONDAYS AND THURSDAYS   glipiZIDE (GLUCOTROL XL) 10 MG 24 hr tablet TAKE 2 TABLETS DAILY WITH BREAKFAST   ibuprofen (ADVIL) 400 MG tablet Take 400 mg by mouth every 6 (six) hours as needed for moderate pain or mild pain.   levothyroxine (SYNTHROID) 50 MCG tablet TAKE 1 TABLET DAILY BEFORE BREAKFAST   losartan (COZAAR) 25 MG tablet TAKE 1 TABLET DAILY   magnesium oxide (MAG-OX) 400 MG tablet Take  2 tablets (800 mg total) by mouth at bedtime.   rosuvastatin (CRESTOR) 5 MG tablet Take 1 tablet (5 mg total) by mouth daily.   Semaglutide (RYBELSUS) 14 MG TABS Take 1 tablet (14 mg total) by mouth daily.   spironolactone (ALDACTONE) 25 MG tablet Take 1 tablet (25 mg total) by mouth 2 (two) times daily.   triamcinolone (KENALOG) 0.025 % cream Apply 1 application topically 2 (two) times daily. To left upper eyelid (Patient taking differently: Apply 1 application  topically daily as needed (rash). To left upper eyelid)     Allergies:   Patient has no known allergies.   Past Medical History:  Diagnosis Date   CHF (congestive heart failure) (HCC)    Diabetes mellitus without complication (HCC)    Gout    Hypertension    LBBB (left bundle branch block)    Non-ischemic cardiomyopathy ([place])    a. s/p STJ CRTD   PVC (premature ventricular contraction)     Past Surgical History:  Procedure Laterality Date   APPENDECTOMY  2011   CHOLECYSTECTOMY     FRACTURE SURGERY Right 2007   Plate and screws in arm   HIP SURGERY     IMPLANTABLE CARDIOVERTER DEFIBRILLATOR IMPLANT N/A 12/30/2014   STJ  CRTD implanted by Dr Lovena Le   ORIF ANKLE FRACTURE Right 05/31/2022   Procedure: OPEN TREATMENT RIGHT BIMALLEOLAR ANKLE FRACTURE;  Surgeon: Erle Crocker, MD;  Location: [place];  Service: Orthopedics;  Laterality: Right;  LENGTH OF SURGERY: 90 MINUTES   SYNDESMOSIS REPAIR Right 05/31/2022   Procedure: POSSIBLE OPEN TREATMENT OF SYNDESMOSIS;  Surgeon: Erle Crocker, MD;  Location: [place];  Service: Orthopedics;  Laterality: Right;   WRIST SURGERY Right 2010   Thumb     Social History:  The patient  reports that he has never smoked. He has never used smokeless tobacco. He reports current alcohol use. He reports that he does not use drugs.   Family History:  The patient's family history includes Alcohol abuse in his maternal grandfather and maternal grandmother; Depression in his sister; Healthy in  his mother; Heart attack in his father; Hypertension in his father.    ROS:  Please see the history of present illness. All other systems are reviewed and  Negative to the above problem except as noted.    PHYSICAL EXAM: VS:  BP 116/80   Pulse 76   Ht 5' 10"$  (1.778 m)   Wt 263 lb (119.3 kg)   SpO2 98%   BMI 37.74 kg/m   GEN: Well nourished, well developed, in no acute distress  HEENT: normal  Neck: no JVD, carotid bruit Cardiac: RRR; no murmur  No LE edema  Respiratory:  clear to auscultation bilaterally GI: soft, nontender, nondistended, + BS  No hepatomegaly  MS: no deformity Moving all extremities   Skin: warm and dry, no rash Neuro:  Strength and sensation are intact Psych: euthymic mood, full affect   EKG:  EKG is not ordered today.  Echo     TTE 03/29/2022  1. Notable dyssynchrony. Left ventricular ejection fraction, by estimation, is 35 to 40%. The left ventricle has moderately decreased function. The left ventricle demonstrates global hypokinesis. There is mild concentric left ventricular hypertrophy. Left ventricular diastolic parameters are indeterminate. The average left ventricular global longitudinal strain is -16.5 %. The global longitudinal strain is abnormal.   2. Right ventricular systolic function is normal. The right ventricular size is mildly enlarged. There is normal pulmonary artery systolic pressure.   3. Left atrial size was mildly dilated.   4. The mitral valve is grossly normal. Trivial mitral valve regurgitation. No evidence of mitral stenosis.   5. The aortic valve is grossly normal. Aortic valve regurgitation is not visualized. No aortic stenosis is present.   6. Aortic dilatation noted. There is mild dilatation of the ascending aorta, measuring 40 mm.   7. The inferior vena cava is normal in size with <50% respiratory variability, suggesting right atrial pressure of 8 mmHg.   Comparison(s): Prior images reviewed side by side. EF slightly worsened  compared to prior, with significant LV dyssynchrony.  Lipid Panel    Component Value Date/Time   CHOL 164 07/19/2021 0000   TRIG 119 07/19/2021 0000   HDL 45 07/19/2021 0000   CHOLHDL 3.6 07/19/2021 0000   VLDL 21 06/18/2016 0946   LDLCALC 97 07/19/2021 0000      Wt Readings from Last 3 Encounters:  11/13/22 263 lb (119.3 kg)  10/10/22 260 lb (117.9 kg)  10/02/22 260 lb (117.9 kg)      ASSESSMENT AND PLAN:  1  NICM   s/p CRT-D   Echo in June 2023 LVEF 35 to 40%   Pt feels good   Breathing is good  Volume status is good on exam I would keep on same regimen   I would not switch losartan to [facility]   Worry it may be too much for BP, esp as he continues to lose wt. Follow with EP then I will see  2  LBBB    3  HTN  Adquate control  4  DM  FOllows with PCP    4  HCM   Discussed diet   Whole, minimally processed food  LImit carbs      Current medicines are reviewed at length with the patient today.  The patient does not have concerns regarding medicines.  Signed, Dorris Carnes, MD  11/13/2022 [facility] [place], [place], [place]  [address] Phone: [phone]; Fax: [phone]

## 2022-11-27 ENCOUNTER — Telehealth: Payer: Self-pay

## 2022-11-27 ENCOUNTER — Ambulatory Visit: Payer: Managed Care, Other (non HMO) | Attending: Internal Medicine

## 2022-11-27 DIAGNOSIS — I428 Other cardiomyopathies: Secondary | ICD-10-CM

## 2022-11-27 NOTE — Telephone Encounter (Signed)
Pt aware that his procedure time has changed from 730 to 330.Marland KitchenMarland Kitchen

## 2022-11-28 LAB — CBC
Hematocrit: 44.8 % (ref 37.5–51.0)
Hemoglobin: 16.1 g/dL (ref 13.0–17.7)
MCH: 32.6 pg (ref 26.6–33.0)
MCHC: 35.9 g/dL — ABNORMAL HIGH (ref 31.5–35.7)
MCV: 91 fL (ref 79–97)
Platelets: 182 10*3/uL (ref 150–450)
RBC: 4.94 x10E6/uL (ref 4.14–5.80)
RDW: 13.1 % (ref 11.6–15.4)
WBC: 11 10*3/uL — ABNORMAL HIGH (ref 3.4–10.8)

## 2022-11-28 LAB — BASIC METABOLIC PANEL
BUN/Creatinine Ratio: 18 (ref 9–20)
BUN: 20 mg/dL (ref 6–24)
CO2: 23 mmol/L (ref 20–29)
Calcium: 9.6 mg/dL (ref 8.7–10.2)
Chloride: 99 mmol/L (ref 96–106)
Creatinine, Ser: 1.12 mg/dL (ref 0.76–1.27)
Glucose: 179 mg/dL — ABNORMAL HIGH (ref 70–99)
Potassium: 4.6 mmol/L (ref 3.5–5.2)
Sodium: 138 mmol/L (ref 134–144)
eGFR: 78 mL/min/{1.73_m2} (ref >59)

## 2022-12-13 ENCOUNTER — Ambulatory Visit (INDEPENDENT_AMBULATORY_CARE_PROVIDER_SITE_OTHER): Payer: Managed Care, Other (non HMO)

## 2022-12-13 DIAGNOSIS — I428 Other cardiomyopathies: Secondary | ICD-10-CM | POA: Diagnosis not present

## 2022-12-13 LAB — CUP PACEART REMOTE DEVICE CHECK
Battery Remaining Longevity: 0 mo
Battery Voltage: 2.57 V
Brady Statistic AP VP Percent: 7.3 %
Brady Statistic AP VS Percent: 1 %
Brady Statistic AS VP Percent: 92 %
Brady Statistic AS VS Percent: 1 %
Brady Statistic RA Percent Paced: 6.8 %
Date Time Interrogation Session: 20240321075811
HighPow Impedance: 88 Ohm
HighPow Impedance: 88 Ohm
Implantable Lead Connection Status: 753985
Implantable Lead Connection Status: 753985
Implantable Lead Connection Status: 753985
Implantable Lead Implant Date: 20160407
Implantable Lead Implant Date: 20160407
Implantable Lead Implant Date: 20160407
Implantable Lead Location: 753858
Implantable Lead Location: 753859
Implantable Lead Location: 753860
Implantable Lead Model: 7122
Implantable Pulse Generator Implant Date: 20160407
Lead Channel Impedance Value: 490 Ohm
Lead Channel Impedance Value: 490 Ohm
Lead Channel Impedance Value: 640 Ohm
Lead Channel Pacing Threshold Amplitude: 0.75 V
Lead Channel Pacing Threshold Amplitude: 1 V
Lead Channel Pacing Threshold Amplitude: 1.625 V
Lead Channel Pacing Threshold Pulse Width: 0.4 ms
Lead Channel Pacing Threshold Pulse Width: 0.4 ms
Lead Channel Pacing Threshold Pulse Width: 0.5 ms
Lead Channel Sensing Intrinsic Amplitude: 12 mV
Lead Channel Sensing Intrinsic Amplitude: 3.1 mV
Lead Channel Setting Pacing Amplitude: 2 V
Lead Channel Setting Pacing Amplitude: 2.5 V
Lead Channel Setting Pacing Amplitude: 2.5 V
Lead Channel Setting Pacing Pulse Width: 0.4 ms
Lead Channel Setting Pacing Pulse Width: 0.5 ms
Lead Channel Setting Sensing Sensitivity: 0.5 mV
Pulse Gen Serial Number: 7250204
Zone Setting Status: 755011

## 2022-12-14 NOTE — Pre-Procedure Instructions (Signed)
Attempted to call patient regarding procedure instruction.  Left voicemail on the following items: Arrival time 1300 Nothing to eat or drink after midnight No meds AM of procedure Responsible person to drive you home and stay with you for 24 hrs Wash with special soap night before and morning of procedure

## 2022-12-17 ENCOUNTER — Other Ambulatory Visit: Payer: Self-pay

## 2022-12-17 ENCOUNTER — Ambulatory Visit (HOSPITAL_COMMUNITY)
Admission: RE | Admit: 2022-12-17 | Discharge: 2022-12-17 | Disposition: A | Discharge status: Home / Self Care | Payer: Managed Care, Other (non HMO) | Source: Ambulatory Visit | Attending: Internal Medicine | Admitting: Internal Medicine

## 2022-12-17 ENCOUNTER — Encounter (HOSPITAL_COMMUNITY)
Admission: RE | Disposition: A | Discharge status: Home / Self Care | Payer: Self-pay | Source: Ambulatory Visit | Attending: Internal Medicine

## 2022-12-17 DIAGNOSIS — I447 Left bundle-branch block, unspecified: Secondary | ICD-10-CM | POA: Diagnosis not present

## 2022-12-17 DIAGNOSIS — I428 Other cardiomyopathies: Secondary | ICD-10-CM | POA: Diagnosis not present

## 2022-12-17 DIAGNOSIS — Z4502 Encounter for adjustment and management of automatic implantable cardiac defibrillator: Secondary | ICD-10-CM | POA: Insufficient documentation

## 2022-12-17 DIAGNOSIS — I110 Hypertensive heart disease with heart failure: Secondary | ICD-10-CM | POA: Insufficient documentation

## 2022-12-17 DIAGNOSIS — Z79899 Other long term (current) drug therapy: Secondary | ICD-10-CM | POA: Diagnosis not present

## 2022-12-17 DIAGNOSIS — I5022 Chronic systolic (congestive) heart failure: Secondary | ICD-10-CM | POA: Insufficient documentation

## 2022-12-17 HISTORY — PX: BIV ICD GENERATOR CHANGEOUT: EP1194

## 2022-12-17 LAB — GLUCOSE, CAPILLARY
Glucose-Capillary: 157 mg/dL — ABNORMAL HIGH (ref 70–99)
Glucose-Capillary: 146 mg/dL — ABNORMAL HIGH (ref 70–99)

## 2022-12-17 SURGERY — BIV ICD GENERATOR CHANGEOUT

## 2022-12-17 MED ORDER — CEFAZOLIN SODIUM-DEXTROSE 2-4 GM/100ML-% IV SOLN
2.0000 g | INTRAVENOUS | Status: AC
  Administered 2022-12-17: 2 g via INTRAVENOUS
  Filled 2022-12-17: qty 100

## 2022-12-17 MED ORDER — LIDOCAINE HCL (PF) 1 % IJ SOLN
INTRAMUSCULAR | Status: DC | PRN
  Administered 2022-12-17: 60 mL

## 2022-12-17 MED ORDER — ACETAMINOPHEN 325 MG PO TABS: 325.0000 mg | ORAL_TABLET | ORAL | Status: DC | PRN

## 2022-12-17 MED ORDER — SODIUM CHLORIDE 0.9 % IV SOLN
80.0000 mg | INTRAVENOUS | Status: AC
  Administered 2022-12-17: 80 mg
  Filled 2022-12-17: qty 2

## 2022-12-17 MED ORDER — ONDANSETRON HCL 4 MG/2ML IJ SOLN: 4.0000 mg | Freq: Four times a day (QID) | INTRAMUSCULAR | Status: DC | PRN

## 2022-12-17 MED ORDER — CHLORHEXIDINE GLUCONATE 4 % EX LIQD
4.0000 | Freq: Once | CUTANEOUS | Status: DC
  Filled 2022-12-17: qty 60

## 2022-12-17 MED ORDER — POVIDONE-IODINE 10 % EX SWAB
2.0000 | Freq: Once | CUTANEOUS | Status: AC
  Administered 2022-12-17: 2 via TOPICAL

## 2022-12-17 MED ORDER — SODIUM CHLORIDE 0.9 % IV SOLN: INTRAVENOUS | Status: DC

## 2022-12-17 SURGICAL SUPPLY — 7 items
ASSURA CRTD CD3369-40C (ICD Generator) ×1 IMPLANT
CABLE SURGICAL S-101-97-12 (CABLE) ×1 IMPLANT
DEFIB ASSURA CRT-D (ICD Generator) IMPLANT
PAD DEFIB RADIO PHYSIO CONN (PAD) ×1 IMPLANT
POUCH AIGIS-R ANTIBACT ICD (Mesh General) ×1 IMPLANT
POUCH AIGIS-R ANTIBACT ICD LRG (Mesh General) IMPLANT
TRAY PACEMAKER INSERTION (PACKS) ×1 IMPLANT

## 2022-12-17 NOTE — H&P (Signed)
Cardiology Office Note Date:  10/02/2022  Patient ID:  Andre Picardo., DOB 1965/11/08, MRN [id number] PCP:  Silverio Decamp, MD            Cardiologist:  Dorris Carnes, MD Electrophysiologist: Cristopher Peru, MD     Chief Complaint: 12mon device check, nearing ERI   History of Present Illness: Andre Shapiro. is a 57 y.o. male with PMH notable for LBBB, NICM, HFrEF s/p ICD, HTN; seen today for Cristopher Peru, MD for routine electrophysiology followup. Since last being seen in our clinic the patient reports doing really well.    Has not been seen by Dr. Lovena Le since 2019.    Last saw PA Charlcie Cradle 02/2022, feeling well, nearing device gen change, rec updated echo. Stopped amlodipine & lisinopril, started losartan. Device had remote noise reversion on the A-lead, not reproducible.   Today, he has lost ~45lbs with medications, still enjoys eating cheeseburgers at times. He has recently had R ankle surgery and has some swelling in that area. Works for a [employer], 12h shifts on feet throughout. Has mild lower extremity swelling at the end of work shift, but resolves by the morning.     he denies chest pain, palpitations, dyspnea, PND, orthopnea, nausea, vomiting, dizziness, syncope, weight gain, or early satiety.     He has not seen general cardiology in quite a while. Follows regularly with PCP for DM mgmt.    Device Information: Abbott CRT-D, implant 12/2014; dx NICM, LBBB       Past Medical History:  Diagnosis Date   CHF (congestive heart failure) ([place])     Diabetes mellitus without complication (HCC)     Gout     Hypertension     LBBB (left bundle branch block)     Non-ischemic cardiomyopathy ([place])      a. s/p STJ CRTD   PVC (premature ventricular contraction)             Past Surgical History:  Procedure Laterality Date   APPENDECTOMY   2011   CHOLECYSTECTOMY       FRACTURE SURGERY Right 2007    Plate and screws in arm   HIP SURGERY        IMPLANTABLE CARDIOVERTER DEFIBRILLATOR IMPLANT N/A 12/30/2014    STJ CRTD implanted by Dr Lovena Le   ORIF ANKLE FRACTURE Right 05/31/2022    Procedure: OPEN TREATMENT RIGHT BIMALLEOLAR ANKLE FRACTURE;  Surgeon: Erle Crocker, MD;  Location: [place];  Service: Orthopedics;  Laterality: Right;  LENGTH OF SURGERY: 90 MINUTES   SYNDESMOSIS REPAIR Right 05/31/2022    Procedure: POSSIBLE OPEN TREATMENT OF SYNDESMOSIS;  Surgeon: Erle Crocker, MD;  Location: [place];  Service: Orthopedics;  Laterality: Right;   WRIST SURGERY Right 2010    Thumb          Current Outpatient Medications  Medication Instructions   acetaminophen (TYLENOL) 650 mg, Oral, 3 times daily PRN   allopurinol (ZYLOPRIM) 300 mg, Oral, 2 times daily   AMBULATORY NON FORMULARY MEDICATION Single glucometer with lancets, test strips, testing 3 times daily   aspirin EC 81 mg, Oral, Daily, Swallow whole.   carvedilol (COREG) 25 mg, Oral, 2 times daily with meals   clotrimazole-betamethasone (LOTRISONE) cream 1 application , Topical, 2 times daily   Continuous Blood Gluc Receiver (DEXCOM G6 RECEIVER) DEVI 1 each, Does not apply, Daily   Continuous Blood Gluc Transmit (DEXCOM G6 TRANSMITTER) MISC 1 each, Does not apply, Daily  Empagliflozin-metFORMIN HCl ER (SYNJARDY XR) 25-1000 MG TB24 1 tablet, Oral, Daily   fluticasone (FLONASE) 50 MCG/ACT nasal spray 1 spray, Each Nare, Daily PRN   furosemide (LASIX) 40 MG tablet TAKE ONE TABLET BY MOUTH TWICE A WEEK ON MONDAYS AND THURSDAYS   glipiZIDE (GLUCOTROL XL) 10 MG 24 hr tablet TAKE 2 TABLETS DAILY WITH BREAKFAST   ibuprofen (ADVIL) 400 mg, Oral, Every 6 hours PRN   levothyroxine (SYNTHROID) 50 MCG tablet TAKE 1 TABLET DAILY BEFORE BREAKFAST   losartan (COZAAR) 25 mg, Oral, Daily   magnesium oxide (MAG-OX) 800 mg, Oral, Daily at bedtime   Rybelsus 14 mg, Oral, Daily   spironolactone (ALDACTONE) 25 MG tablet TAKE 1 TABLET TWICE A DAY   triamcinolone (KENALOG) Q000111Q % cream 1  application , Topical, 2 times daily, To left upper eyelid          Social History:  The patient  reports that he has never smoked. He has never used smokeless tobacco. He reports current alcohol use. He reports that he does not use drugs.    Family History:   include only if pertinent The patient's family history includes Alcohol abuse in his maternal grandfather and maternal grandmother; Depression in his sister; Healthy in his mother; Heart attack in his father; Hypertension in his father.   ROS:  Please see the history of present illness. All other systems are reviewed and otherwise negative.    PHYSICAL EXAM:  VS:  BP 120/82   Pulse 74   Ht 5\' 10"  (1.778 m)   Wt 260 lb (117.9 kg)   SpO2 98%   BMI 37.31 kg/m  BMI: Body mass index is 37.31 kg/m.   GEN- The patient is well appearing, alert and oriented x 3 today.   HEENT: normocephalic, atraumatic; sclera clear, conjunctiva pink; hearing intact; oropharynx clear; neck supple, no JVP Lungs- Clear to ausculation bilaterally, normal work of breathing.  No wheezes, rales, rhonchi Heart- Regular rate and rhythm, no murmurs, rubs or gallops, PMI not laterally displaced GI- soft, non-tender, non-distended, bowel sounds present, no hepatosplenomegaly Extremities- No peripheral edema on LLE; 1+ edema on RLE. no clubbing or cyanosis; DP/PT/radial pulses 2+ bilaterally MS- no significant deformity or atrophy Skin- warm and dry, no rash or lesion,  device pocket well-healed Psych- euthymic mood, full affect Neuro- strength and sensation are intact     Device interrogation done today and reviewed by myself:  Battery nearing ERI Lead thresholds, impedence, sensing stable  No episodes No changes made today   EKG is ordered. Personal review of EKG from today shows:  A-sensed v-paced, rate 74bpm, LAD, Poor R-wave progression   Recent Labs: 05/31/2022: BUN 16; Creatinine, Ser 1.00; Hemoglobin 17.0; Potassium 4.2; Sodium 137  No results  found for requested labs within last 365 days.    CrCl cannot be calculated (Patient's most recent lab result is older than the maximum 21 days allowed.).       Wt Readings from Last 3 Encounters:  10/02/22 260 lb (117.9 kg)  05/31/22 260 lb (117.9 kg)  05/16/22 260 lb (117.9 kg)      Additional studies reviewed include: Previous EP, cardiology notes.    TTE 03/29/2022  1. Notable dyssynchrony. Left ventricular ejection fraction, by estimation, is 35 to 40%. The left ventricle has moderately decreased function. The left ventricle demonstrates global hypokinesis. There is mild concentric left ventricular hypertrophy. Left ventricular diastolic parameters are indeterminate. The average left ventricular global longitudinal strain is -16.5 %. The  global longitudinal strain is abnormal.   2. Right ventricular systolic function is normal. The right ventricular size is mildly enlarged. There is normal pulmonary artery systolic pressure.   3. Left atrial size was mildly dilated.   4. The mitral valve is grossly normal. Trivial mitral valve regurgitation. No evidence of mitral stenosis.   5. The aortic valve is grossly normal. Aortic valve regurgitation is not visualized. No aortic stenosis is present.   6. Aortic dilatation noted. There is mild dilatation of the ascending aorta, measuring 40 mm.   7. The inferior vena cava is normal in size with <50% respiratory variability, suggesting right atrial pressure of 8 mmHg.   Comparison(s): Prior images reviewed side by side. EF slightly worsened compared to prior, with significant LV dyssynchrony.    ASSESSMENT AND PLAN:   #) NICM, LBBB s/p CRT-D Set up for monthly remotes as nearing ERI Device functioning well, no changes Discussed risks/benefits of generator change procedure to include infection, bleeding. Patient verbalized understanding and wished to proceed once recommended.    #) HFrEF Euvolemic on exam with good exercise tolerance I will  have him to re-establish with general cardiology to discuss titration of medications to GDMT Taking coreg, spiro, losartan; lasix twice per week   #) HTN At goal today.  Recommend checking blood pressures 1-2 times per week at home and recording the values.  Recommend bringing these recordings to the primary care physician.     Current medicines are reviewed at length with the patient today.   The patient does not have concerns regarding his medicines.  The following changes were made today:  none   Labs/ tests ordered today include:     Orders Placed This Encounter  Procedures   EKG 12-Lead     Disposition: Follow up with Dr. Lovena Le in as usual post procedure     Signed, Mamie Levers, NP  10/02/22 11:46 AM        EP Attending  Patient seen and examined. Agree with above. He has reached ICD gen ERI and he will undergo ICD gen change out. I have reviewed the indications/risks/benefits/goals/expectations and he wishes to proceed.  Carleene Overlie Alanie Syler,MD

## 2022-12-17 NOTE — Discharge Instructions (Signed)

## 2022-12-18 ENCOUNTER — Encounter (HOSPITAL_COMMUNITY): Payer: Self-pay | Admitting: Internal Medicine

## 2022-12-26 NOTE — Progress Notes (Signed)
Remote ICD transmission.   

## 2023-01-01 ENCOUNTER — Other Ambulatory Visit: Payer: Self-pay | Admitting: Sports Medicine

## 2023-01-01 DIAGNOSIS — E119 Type 2 diabetes mellitus without complications: Secondary | ICD-10-CM

## 2023-01-02 ENCOUNTER — Ambulatory Visit: Payer: Managed Care, Other (non HMO) | Attending: Cardiology

## 2023-01-02 DIAGNOSIS — I5022 Chronic systolic (congestive) heart failure: Secondary | ICD-10-CM

## 2023-01-02 DIAGNOSIS — Z950 Presence of cardiac pacemaker: Secondary | ICD-10-CM | POA: Diagnosis not present

## 2023-01-02 DIAGNOSIS — I447 Left bundle-branch block, unspecified: Secondary | ICD-10-CM

## 2023-01-02 NOTE — Patient Instructions (Signed)
   After Your ICD (Implantable Cardiac Defibrillator)    Monitor your defibrillator site for redness, swelling, and drainage. Call the device clinic at [phone] if you experience these symptoms or fever/chills.  Your incision was closed with Steri-strips or staples:  You may shower 7 days after your procedure and wash your incision with soap and water. Avoid lotions, ointments, or perfumes over your incision until it is well-healed.  You may use a hot tub or a pool after your wound check appointment if the incision is completely closed.  Your ICD is designed to protect you from life threatening heart rhythms. Because of this, you may receive a shock.   1 shock with no symptoms:  Call the office during business hours. 1 shock with symptoms (chest pain, chest pressure, dizziness, lightheadedness, shortness of breath, overall feeling unwell):  Call 911. If you experience 2 or more shocks in 24 hours:  Call 911. If you receive a shock, you should not drive.  [place] DMV - no driving for 6 months if you receive appropriate therapy from your ICD.   ICD Alerts:  Some alerts are vibratory and others beep. These are NOT emergencies. Please call our office to let us know. If this occurs at night or on weekends, it can wait until the next business day. Send a remote transmission.   Remote monitoring is used to monitor your ICD from home. This monitoring is scheduled every 91 days by our office. It allows us to keep an eye on the functioning of your device to ensure it is working properly. You will routinely see your Electrophysiologist annually (more often if necessary).  

## 2023-01-02 NOTE — Progress Notes (Signed)
Wound check appointment. Steri-strips removed. Wound without redness or edema. Incision edges approximated, wound well healed . Thresholds and sensing consistent with implant device measurements. Lead impedance trends stable over time. No mode switch episodes recorded. No ventricular arrhythmia episodes recorded. Patient bi-ventricularly pacing 99% of the time. Device programmed with appropriate safety margins. Heart failure diagnostics reviewed and trends are stable for patient. Audible/vibratory alerts demonstrated for patient. No changes made this session. Estimated longevity 5 yrs .  Patient enrolled in remote follow up. Plan to check device remotely in 3 months. Patient education completed including shock plan, wound care

## 2023-01-07 ENCOUNTER — Encounter: Payer: Self-pay | Admitting: *Deleted

## 2023-01-14 ENCOUNTER — Ambulatory Visit: Payer: Managed Care, Other (non HMO)

## 2023-01-16 NOTE — Progress Notes (Signed)
Remote ICD transmission.   

## 2023-02-14 ENCOUNTER — Ambulatory Visit: Payer: Managed Care, Other (non HMO)

## 2023-03-05 ENCOUNTER — Other Ambulatory Visit: Payer: Self-pay | Admitting: Sports Medicine

## 2023-03-18 ENCOUNTER — Ambulatory Visit: Payer: Managed Care, Other (non HMO)

## 2023-03-18 DIAGNOSIS — I5022 Chronic systolic (congestive) heart failure: Secondary | ICD-10-CM | POA: Diagnosis not present

## 2023-03-18 DIAGNOSIS — I428 Other cardiomyopathies: Secondary | ICD-10-CM

## 2023-03-19 LAB — CUP PACEART REMOTE DEVICE CHECK
Battery Remaining Longevity: 66 mo
Battery Remaining Percentage: 95 %
Battery Voltage: 3.19 V
Brady Statistic AP VP Percent: 4.2 %
Brady Statistic AP VS Percent: 1 %
Brady Statistic AS VP Percent: 95 %
Brady Statistic AS VS Percent: 1 %
Brady Statistic RA Percent Paced: 4.1 %
Date Time Interrogation Session: 20240624020026
HighPow Impedance: 72 Ohm
HighPow Impedance: 72 Ohm
Implantable Lead Connection Status: 753985
Implantable Lead Connection Status: 753985
Implantable Lead Connection Status: 753985
Implantable Lead Implant Date: 20160407
Implantable Lead Implant Date: 20160407
Implantable Lead Implant Date: 20160407
Implantable Lead Location: 753858
Implantable Lead Location: 753859
Implantable Lead Location: 753860
Implantable Lead Model: 7122
Implantable Pulse Generator Implant Date: 20240325
Lead Channel Impedance Value: 430 Ohm
Lead Channel Impedance Value: 480 Ohm
Lead Channel Impedance Value: 560 Ohm
Lead Channel Pacing Threshold Amplitude: 0.5 V
Lead Channel Pacing Threshold Amplitude: 0.75 V
Lead Channel Pacing Threshold Amplitude: 2 V
Lead Channel Pacing Threshold Pulse Width: 0.5 ms
Lead Channel Pacing Threshold Pulse Width: 0.5 ms
Lead Channel Pacing Threshold Pulse Width: 0.5 ms
Lead Channel Sensing Intrinsic Amplitude: 12 mV
Lead Channel Sensing Intrinsic Amplitude: 3 mV
Lead Channel Setting Pacing Amplitude: 2 V
Lead Channel Setting Pacing Amplitude: 2.5 V
Lead Channel Setting Pacing Amplitude: 3 V
Lead Channel Setting Pacing Pulse Width: 0.5 ms
Lead Channel Setting Pacing Pulse Width: 0.5 ms
Lead Channel Setting Sensing Sensitivity: 0.5 mV
Pulse Gen Serial Number: 5566447
Zone Setting Status: 755011

## 2023-03-26 ENCOUNTER — Ambulatory Visit: Payer: Managed Care, Other (non HMO) | Admitting: Internal Medicine

## 2023-04-05 NOTE — Progress Notes (Signed)
Remote ICD transmission.   

## 2023-04-10 ENCOUNTER — Ambulatory Visit: Payer: Managed Care, Other (non HMO) | Admitting: Sports Medicine

## 2023-04-12 ENCOUNTER — Ambulatory Visit: Payer: Managed Care, Other (non HMO) | Admitting: Sports Medicine

## 2023-04-18 ENCOUNTER — Ambulatory Visit: Payer: Managed Care, Other (non HMO)

## 2023-04-18 ENCOUNTER — Ambulatory Visit (INDEPENDENT_AMBULATORY_CARE_PROVIDER_SITE_OTHER): Payer: Managed Care, Other (non HMO) | Admitting: Sports Medicine

## 2023-04-18 ENCOUNTER — Encounter: Payer: Self-pay | Admitting: Sports Medicine

## 2023-04-18 VITALS — BP 105/63 | HR 86 | Ht 70.0 in | Wt 268.0 lb

## 2023-04-18 DIAGNOSIS — E119 Type 2 diabetes mellitus without complications: Secondary | ICD-10-CM | POA: Diagnosis not present

## 2023-04-18 DIAGNOSIS — M5136 Other intervertebral disc degeneration, lumbar region: Secondary | ICD-10-CM

## 2023-04-18 DIAGNOSIS — M51369 Other intervertebral disc degeneration, lumbar region without mention of lumbar back pain or lower extremity pain: Secondary | ICD-10-CM

## 2023-04-18 MED ORDER — SYNJARDY XR 25-1000 MG PO TB24: 1.0000 | ORAL_TABLET | Freq: Every day | ORAL | 3 refills | Status: DC

## 2023-04-18 MED ORDER — RYBELSUS 14 MG PO TABS: 1.0000 | ORAL_TABLET | Freq: Every day | ORAL | 3 refills | Status: DC

## 2023-04-18 NOTE — Assessment & Plan Note (Signed)
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 Return as needed for this.

## 2023-04-18 NOTE — Progress Notes (Signed)
    Procedures performed today:    None.  Independent interpretation of notes and tests performed by another provider:   None.  Brief History, Exam, Impression, and Recommendations:    Controlled type 2 diabetes mellitus without complication, without long-term current use of insulin (HCC) Well-controlled, checking urine microalbumin creatinine ratio today. He can return to see me in 6 months.  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 Return as needed for this.    ____________________________________________ Ihor Austin. Benjamin Stain, M.D., ABFM., CAQSM., AME. Primary Care and Sports Medicine [facility]  Adjunct Professor of Family Medicine  [employer]  Restaurant manager, fast food

## 2023-04-18 NOTE — Addendum Note (Signed)
Addended by: Monica Becton on: 04/18/2023 02:30 PM   Modules accepted: Orders

## 2023-04-18 NOTE — Assessment & Plan Note (Signed)
Well-controlled, checking urine microalbumin creatinine ratio today. He can return to see me in 6 months.

## 2023-04-25 ENCOUNTER — Other Ambulatory Visit: Payer: Self-pay | Admitting: Sports Medicine

## 2023-04-25 DIAGNOSIS — E119 Type 2 diabetes mellitus without complications: Secondary | ICD-10-CM

## 2023-05-02 ENCOUNTER — Ambulatory Visit: Payer: Managed Care, Other (non HMO) | Attending: Internal Medicine | Admitting: Internal Medicine

## 2023-05-02 ENCOUNTER — Encounter: Payer: Self-pay | Admitting: Internal Medicine

## 2023-05-02 VITALS — BP 110/72 | HR 66 | Ht 70.0 in | Wt 268.0 lb

## 2023-05-02 DIAGNOSIS — I428 Other cardiomyopathies: Secondary | ICD-10-CM

## 2023-05-02 LAB — CUP PACEART INCLINIC DEVICE CHECK
Battery Remaining Longevity: 70 mo
Brady Statistic RA Percent Paced: 4.7 %
Brady Statistic RV Percent Paced: 99.74 %
Date Time Interrogation Session: 20240808092328
HighPow Impedance: 86.625
Implantable Lead Connection Status: 753985
Implantable Lead Connection Status: 753985
Implantable Lead Connection Status: 753985
Implantable Lead Implant Date: 20160407
Implantable Lead Implant Date: 20160407
Implantable Lead Implant Date: 20160407
Implantable Lead Location: 753858
Implantable Lead Location: 753859
Implantable Lead Location: 753860
Implantable Lead Model: 7122
Implantable Pulse Generator Implant Date: 20240325
Lead Channel Impedance Value: 400 Ohm
Lead Channel Impedance Value: 450 Ohm
Lead Channel Impedance Value: 600 Ohm
Lead Channel Pacing Threshold Amplitude: 0.5 V
Lead Channel Pacing Threshold Amplitude: 0.5 V
Lead Channel Pacing Threshold Amplitude: 0.75 V
Lead Channel Pacing Threshold Amplitude: 0.75 V
Lead Channel Pacing Threshold Amplitude: 1 V
Lead Channel Pacing Threshold Amplitude: 1 V
Lead Channel Pacing Threshold Pulse Width: 0.5 ms
Lead Channel Pacing Threshold Pulse Width: 0.5 ms
Lead Channel Pacing Threshold Pulse Width: 0.5 ms
Lead Channel Pacing Threshold Pulse Width: 0.5 ms
Lead Channel Pacing Threshold Pulse Width: 0.8 ms
Lead Channel Pacing Threshold Pulse Width: 0.8 ms
Lead Channel Sensing Intrinsic Amplitude: 2 mV
Lead Channel Sensing Intrinsic Amplitude: 5.8 mV
Lead Channel Setting Pacing Amplitude: 2 V
Lead Channel Setting Pacing Amplitude: 2 V
Lead Channel Setting Pacing Amplitude: 2.5 V
Lead Channel Setting Pacing Pulse Width: 0.5 ms
Lead Channel Setting Pacing Pulse Width: 0.8 ms
Lead Channel Setting Sensing Sensitivity: 0.5 mV
Pulse Gen Serial Number: 5566447
Zone Setting Status: 755011

## 2023-05-02 NOTE — Patient Instructions (Signed)
Medication Instructions:  Your physician recommends that you continue on your current medications as directed. Please refer to the Current Medication list given to you today.  *If you need a refill on your cardiac medications before your next appointment, please call your pharmacy*   Lab Work: None  If you have labs (blood work) drawn today and your tests are completely normal, you will receive your results only by: MyChart Message (if you have MyChart) OR A paper copy in the mail If you have any lab test that is abnormal or we need to change your treatment, we will call you to review the results.   Testing/Procedures: None   Follow-Up: At [facility], you and your health needs are our priority.  As part of our continuing mission to provide you with exceptional heart care, we have created designated Provider Care Teams.  These Care Teams include your primary Cardiologist (physician) and Advanced Practice Providers (APPs -  Physician Assistants and Nurse Practitioners) who all work together to provide you with the care you need, when you need it.  We recommend signing up for the patient portal called "MyChart".  Sign up information is provided on this After Visit Summary.  MyChart is used to connect with patients for Virtual Visits (Telemedicine).  Patients are able to view lab/test results, encounter notes, upcoming appointments, etc.  Non-urgent messages can be sent to your provider as well.   To learn more about what you can do with MyChart, go to [email or web address].    Your next appointment:   1 year(s)  Provider:   Dr Lewayne Bunting

## 2023-05-02 NOTE — Progress Notes (Signed)
HPI [name] returns today for device followup. He is a pleasant 57 yo man with a h/o chronic systolic heart failure, LBBB, s/p biv ICD.  In the interim, he has been stable with no chest pain or sob. No syncope. No ICD shocks. No trouble with healing after his gen change out.  He has occaisional episodes of edema and he admits to some dietary indiscretion.   No Known Allergies   Current Outpatient Medications  Medication Sig Dispense Refill   allopurinol (ZYLOPRIM) 300 MG tablet TAKE 1 TABLET TWICE A DAY 180 tablet 3   ASPIRIN LOW DOSE 81 MG tablet TAKE 1 TABLET DAILY 30 tablet 11   carvedilol (COREG) 25 MG tablet TAKE 1 TABLET TWICE A DAY WITH MEALS (Patient taking differently: Take 50 mg by mouth daily.) 180 tablet 3   clotrimazole-betamethasone (LOTRISONE) cream Apply 1 Application topically 2 (two) times daily as needed (rash).     fluticasone (FLONASE) 50 MCG/ACT nasal spray Place 1 spray into both nostrils daily as needed for allergies or rhinitis.     furosemide (LASIX) 40 MG tablet TAKE ONE TABLET BY MOUTH TWICE A WEEK ON MONDAYS AND THURSDAYS 90 tablet 0   glipiZIDE (GLUCOTROL XL) 10 MG 24 hr tablet TAKE 2 TABLETS DAILY WITH BREAKFAST 180 tablet 3   ibuprofen (ADVIL) 200 MG tablet Take 400 mg by mouth every 6 (six) hours as needed for moderate pain.     levothyroxine (SYNTHROID) 50 MCG tablet TAKE 1 TABLET DAILY BEFORE BREAKFAST 90 tablet 3   losartan (COZAAR) 25 MG tablet TAKE 1 TABLET DAILY 90 tablet 1   magnesium oxide (MAG-OX) 400 MG tablet Take 2 tablets (800 mg total) by mouth at bedtime. (Patient taking differently: Take 800 mg by mouth at bedtime as needed (cramps).) 180 tablet 3   rosuvastatin (CRESTOR) 5 MG tablet Take 1 tablet (5 mg total) by mouth daily. 90 tablet 3   Semaglutide (RYBELSUS) 14 MG TABS Take 1 tablet (14 mg total) by mouth daily. 90 tablet 3   spironolactone (ALDACTONE) 25 MG tablet Take 2 tablets (50 mg total) by mouth daily. 90 tablet 3    SYNJARDY XR 25-1000 MG TB24 TAKE 1 TABLET DAILY 90 tablet 3   No current facility-administered medications for this visit.     Past Medical History:  Diagnosis Date   CHF (congestive heart failure) (HCC)    Diabetes mellitus without complication (HCC)    Gout    Hypertension    LBBB (left bundle branch block)    Non-ischemic cardiomyopathy (HCC)    a. s/p STJ CRTD   PVC (premature ventricular contraction)     ROS:   All systems reviewed and negative except as noted in the HPI.   Past Surgical History:  Procedure Laterality Date   APPENDECTOMY  2011   BIV ICD GENERATOR CHANGEOUT N/A 12/17/2022   Procedure: BIV ICD GENERATOR CHANGEOUT;  Surgeon: Andre Maw, MD;  Location: [facility] INVASIVE CV LAB;  Service: Cardiovascular;  Laterality: N/A;   CHOLECYSTECTOMY     FRACTURE SURGERY Right 2007   Plate and screws in arm   HIP SURGERY     IMPLANTABLE CARDIOVERTER DEFIBRILLATOR IMPLANT N/A 12/30/2014   STJ CRTD implanted by Dr Andre [name]   ORIF ANKLE FRACTURE Right 05/31/2022   Procedure: OPEN TREATMENT RIGHT BIMALLEOLAR ANKLE FRACTURE;  Surgeon: Andre Hart, MD;  Location: [facility] OR;  Service: Orthopedics;  Laterality: Right;  LENGTH OF SURGERY: 90 MINUTES  SYNDESMOSIS REPAIR Right 05/31/2022   Procedure: POSSIBLE OPEN TREATMENT OF SYNDESMOSIS;  Surgeon: Andre Hart, MD;  Location: [facility];  Service: Orthopedics;  Laterality: Right;   WRIST SURGERY Right 2010   Thumb     Family History  Problem Relation Age of Onset   Healthy Mother    Heart attack Father    Hypertension Father    Depression Sister    Alcohol abuse Maternal Grandmother    Alcohol abuse Maternal Grandfather      Social History   Socioeconomic History   Marital status: Married    Spouse name: Andre [name]   Number of children: Not on file   Years of education: Not on file   Highest education level: Not on file  Occupational History   Not on file  Tobacco Use   Smoking status: Never   Smokeless  tobacco: Never  Vaping Use   Vaping status: Never Used  Substance and Sexual Activity   Alcohol use: Yes    Comment: social   Drug use: No   Sexual activity: Not Currently  Other Topics Concern   Not on file  Social History Narrative   Not on file   Social Determinants of Health   Financial Resource Strain: Not on file  Food Insecurity: Not on file  Transportation Needs: Not on file  Physical Activity: Not on file  Stress: Not on file  Social Connections: Unknown (02/03/2022)   Received from [facility]   Social Network    Social Network: Not on file  Intimate Partner Violence: Unknown (12/27/2021)   Received from [facility]   HITS    Physically Hurt: Not on file    Insult or Talk Down To: Not on file    Threaten Physical Harm: Not on file    Scream or Curse: Not on file     BP 110/72   Pulse 66   Ht 5\' 10"  (1.778 m)   Wt 268 lb (121.6 kg)   SpO2 98%   BMI 38.45 kg/m   Physical Exam:  Well appearing NAD HEENT: Unremarkable Neck:  No JVD, no thyromegally Lymphatics:  No adenopathy Back:  No CVA tenderness Lungs:  Clear HEART:  Regular rate rhythm, no murmurs, no rubs, no clicks Abd:  soft, positive bowel sounds, no organomegally, no rebound, no guarding Ext:  2 plus pulses, no edema, no cyanosis, no clubbing Skin:  No rashes no nodules Neuro:  CN II through XII intact, motor grossly intact  EKG - NSR with p synchronous biv pacing  DEVICE  Normal device function.  See PaceArt for details.   Assess/Plan:  1. BiV ICD - his device is working but LV threshold is increased. We have reprogrammed his device today to maximize his battery longevity 2. Chronic systolic heart failure - his symptoms are class 2. He will continue his current meds. He is encouraged to avoid salty foods.  3. Obesity - he is encouraged to lose weight. 4. HTN - his blood pressure is well controlled. No change in meds.   Leonia Reeves.D.

## 2023-05-13 ENCOUNTER — Other Ambulatory Visit: Payer: Self-pay | Admitting: Internal Medicine

## 2023-06-17 ENCOUNTER — Ambulatory Visit (INDEPENDENT_AMBULATORY_CARE_PROVIDER_SITE_OTHER): Payer: Managed Care, Other (non HMO)

## 2023-06-17 DIAGNOSIS — I428 Other cardiomyopathies: Secondary | ICD-10-CM | POA: Diagnosis not present

## 2023-06-17 DIAGNOSIS — I5022 Chronic systolic (congestive) heart failure: Secondary | ICD-10-CM

## 2023-06-18 LAB — CUP PACEART REMOTE DEVICE CHECK
Battery Remaining Longevity: 71 mo
Battery Remaining Percentage: 91 %
Battery Voltage: 3.13 V
Brady Statistic AP VP Percent: 6.2 %
Brady Statistic AP VS Percent: 1 %
Brady Statistic AS VP Percent: 94 %
Brady Statistic AS VS Percent: 1 %
Brady Statistic RA Percent Paced: 6.2 %
Date Time Interrogation Session: 20240923031555
HighPow Impedance: 96 Ohm
HighPow Impedance: 96 Ohm
Implantable Lead Connection Status: 753985
Implantable Lead Connection Status: 753985
Implantable Lead Connection Status: 753985
Implantable Lead Implant Date: 20160407
Implantable Lead Implant Date: 20160407
Implantable Lead Implant Date: 20160407
Implantable Lead Location: 753858
Implantable Lead Location: 753859
Implantable Lead Location: 753860
Implantable Lead Model: 7122
Implantable Pulse Generator Implant Date: 20240325
Lead Channel Impedance Value: 400 Ohm
Lead Channel Impedance Value: 440 Ohm
Lead Channel Impedance Value: 630 Ohm
Lead Channel Pacing Threshold Amplitude: 0.5 V
Lead Channel Pacing Threshold Amplitude: 0.75 V
Lead Channel Pacing Threshold Amplitude: 1 V
Lead Channel Pacing Threshold Pulse Width: 0.5 ms
Lead Channel Pacing Threshold Pulse Width: 0.5 ms
Lead Channel Pacing Threshold Pulse Width: 0.8 ms
Lead Channel Sensing Intrinsic Amplitude: 12 mV
Lead Channel Sensing Intrinsic Amplitude: 2.9 mV
Lead Channel Setting Pacing Amplitude: 2 V
Lead Channel Setting Pacing Amplitude: 2 V
Lead Channel Setting Pacing Amplitude: 2.5 V
Lead Channel Setting Pacing Pulse Width: 0.5 ms
Lead Channel Setting Pacing Pulse Width: 0.8 ms
Lead Channel Setting Sensing Sensitivity: 0.5 mV
Pulse Gen Serial Number: 5566447
Zone Setting Status: 755011

## 2023-07-01 NOTE — Progress Notes (Signed)
Remote ICD transmission.   

## 2023-07-09 ENCOUNTER — Other Ambulatory Visit: Payer: Self-pay | Admitting: Sports Medicine

## 2023-07-09 DIAGNOSIS — E119 Type 2 diabetes mellitus without complications: Secondary | ICD-10-CM

## 2023-09-16 ENCOUNTER — Ambulatory Visit: Payer: Managed Care, Other (non HMO)

## 2023-09-16 DIAGNOSIS — I428 Other cardiomyopathies: Secondary | ICD-10-CM

## 2023-09-16 DIAGNOSIS — I5022 Chronic systolic (congestive) heart failure: Secondary | ICD-10-CM

## 2023-09-16 LAB — CUP PACEART REMOTE DEVICE CHECK
Battery Remaining Longevity: 69 mo
Battery Remaining Percentage: 87 %
Battery Voltage: 3.07 V
Brady Statistic AP VP Percent: 5.6 %
Brady Statistic AP VS Percent: 1 %
Brady Statistic AS VP Percent: 94 %
Brady Statistic AS VS Percent: 1 %
Brady Statistic RA Percent Paced: 5.3 %
Date Time Interrogation Session: 20241223084922
HighPow Impedance: 80 Ohm
HighPow Impedance: 80 Ohm
Implantable Lead Connection Status: 753985
Implantable Lead Connection Status: 753985
Implantable Lead Connection Status: 753985
Implantable Lead Implant Date: 20160407
Implantable Lead Implant Date: 20160407
Implantable Lead Implant Date: 20160407
Implantable Lead Location: 753858
Implantable Lead Location: 753859
Implantable Lead Location: 753860
Implantable Lead Model: 7122
Implantable Pulse Generator Implant Date: 20240325
Lead Channel Impedance Value: 410 Ohm
Lead Channel Impedance Value: 440 Ohm
Lead Channel Impedance Value: 640 Ohm
Lead Channel Pacing Threshold Amplitude: 0.5 V
Lead Channel Pacing Threshold Amplitude: 0.75 V
Lead Channel Pacing Threshold Amplitude: 1 V
Lead Channel Pacing Threshold Pulse Width: 0.5 ms
Lead Channel Pacing Threshold Pulse Width: 0.5 ms
Lead Channel Pacing Threshold Pulse Width: 0.8 ms
Lead Channel Sensing Intrinsic Amplitude: 1.3 mV
Lead Channel Sensing Intrinsic Amplitude: 6.6 mV
Lead Channel Setting Pacing Amplitude: 2 V
Lead Channel Setting Pacing Amplitude: 2 V
Lead Channel Setting Pacing Amplitude: 2.5 V
Lead Channel Setting Pacing Pulse Width: 0.5 ms
Lead Channel Setting Pacing Pulse Width: 0.8 ms
Lead Channel Setting Sensing Sensitivity: 0.5 mV
Pulse Gen Serial Number: 5566447
Zone Setting Status: 755011

## 2023-10-07 ENCOUNTER — Other Ambulatory Visit: Payer: Self-pay | Admitting: Sports Medicine

## 2023-10-07 ENCOUNTER — Encounter: Payer: Self-pay | Admitting: Internal Medicine

## 2023-10-07 DIAGNOSIS — E119 Type 2 diabetes mellitus without complications: Secondary | ICD-10-CM

## 2023-10-09 ENCOUNTER — Telehealth: Payer: Self-pay

## 2023-10-09 ENCOUNTER — Encounter: Payer: Self-pay | Admitting: Physician Assistant

## 2023-10-09 ENCOUNTER — Ambulatory Visit: Payer: Managed Care, Other (non HMO) | Attending: Physician Assistant | Admitting: Physician Assistant

## 2023-10-09 ENCOUNTER — Other Ambulatory Visit (INDEPENDENT_AMBULATORY_CARE_PROVIDER_SITE_OTHER): Payer: Managed Care, Other (non HMO)

## 2023-10-09 VITALS — BP 102/78 | HR 72 | Ht 70.0 in | Wt 267.0 lb

## 2023-10-09 DIAGNOSIS — R002 Palpitations: Secondary | ICD-10-CM

## 2023-10-09 DIAGNOSIS — R55 Syncope and collapse: Secondary | ICD-10-CM | POA: Diagnosis not present

## 2023-10-09 DIAGNOSIS — I77810 Thoracic aortic ectasia: Secondary | ICD-10-CM

## 2023-10-09 DIAGNOSIS — I428 Other cardiomyopathies: Secondary | ICD-10-CM | POA: Diagnosis not present

## 2023-10-09 DIAGNOSIS — I5022 Chronic systolic (congestive) heart failure: Secondary | ICD-10-CM

## 2023-10-09 DIAGNOSIS — I493 Ventricular premature depolarization: Secondary | ICD-10-CM

## 2023-10-09 DIAGNOSIS — R000 Tachycardia, unspecified: Secondary | ICD-10-CM | POA: Diagnosis not present

## 2023-10-09 DIAGNOSIS — I959 Hypotension, unspecified: Secondary | ICD-10-CM | POA: Diagnosis not present

## 2023-10-09 MED ORDER — CARVEDILOL 12.5 MG PO TABS: 12.5000 mg | ORAL_TABLET | Freq: Two times a day (BID) | ORAL | 3 refills | Status: DC

## 2023-10-09 MED ORDER — SPIRONOLACTONE 25 MG PO TABS: 12.5000 mg | ORAL_TABLET | Freq: Every day | ORAL | 3 refills | Status: DC

## 2023-10-09 MED ORDER — CARVEDILOL 12.5 MG PO TABS: 25.0000 mg | ORAL_TABLET | Freq: Two times a day (BID) | ORAL | 3 refills | Status: DC

## 2023-10-09 MED ORDER — FUROSEMIDE 40 MG PO TABS: ORAL_TABLET | ORAL | 3 refills | Status: DC

## 2023-10-09 NOTE — Progress Notes (Addendum)
Cardiology Office Note    Date:  10/09/2023  ID:  Andre Slick., DOB [birth year], MRN [id number] PCP:  Monica Becton, MD  Cardiologist:  Dietrich Pates, MD  Electrophysiologist:  Lewayne Bunting, MD   Chief Complaint: palpitations  History of Present Illness: .    Andre [name]. is a 58 y.o. male with visit-pertinent history of LBBB, NICM, HFrEF s/p Abbot CRT-D 2016, mild dilation of ascending aorta, HTN, PVCs, DM, gout, peripheral neuropathy, prior alcohol abuse seen for elevation of heart rate by Apple watch.  He has longstanding history of HF back to 2004 per notes. He was lost to follow-up due to lack of insurance, then re-established with [facility] in 2015. Dr. Charlott Rakes notes states "Patient was admitted in 2004 with CHF  Diuresed  Had cath later ([facility]) Reported normal." The patient reports the cath was around 2006. He was in his early 30s at time of diagnosis. He was also drinking alcohol more heavily at that time but denies any illicit drug use history. She also references negative sleep study in 2006. Echo here in 2015 showed LV dysfunction EF 20-25%. Nuclear stress test 03/2014 showed "fixed apical defect, inferior defect partially fixed with very mild peri-infarct ischemia/could represent variations in diaphragmatic attenuation, EF 20-25%." Dr. Tenny Craw felt reassuring without significant ischemia and patient was managed medically. He has clinically done well and remained active. He underwent CRT-D in 2016 which is followed by EP. Echo 2018 showed EF 40-45%. Last echo 03/2022 showed decline in EF to EF 35-40% with notable dyssynchrony, mild LVH, mildly enlarged RV, mild dilation of ascending aorta. He had gen change in early 11/2022. Entresto not previously pursued as Dr. Tenny Craw was concerned his BP may not tolerate this. He reports has lost about 50lb in the last few years (>300 in 2021). Weight is stable over the last 1 year by our review. Father and maternal grandmother had CAD.  No hx of SCD, sarcoidosis, HF.  He sent a MyChart message on 10/07/23 regarding episode of elevated heart rate and was scheduled for an appointment. He has had 8 episodes since this started on Saturday where he first developed palpitations and HR in the 1teens-120s by Apple Watch while sitting watching TV. All of the episodes have happened at rest and resolve without intervention. The first one was associated with SOB and diaphoresis. He states he went to go get his wife and then laid down and felt better within 2 minutes. The entire episode lasted about 15 minutes. He had a recurrent episode of palpitations on Sunday that lasted 20 minutes. (The MyChart message mentions tunnel vision but today he declines having this with any of the episodes, but does report having this in the distant past.) He has had 1-2 episodes per day since that time, lasting just a few minutes upto 15 minutes. The latter episodes were not associated with any other symptom aside from heart racing. He denies any ICD shock. He was able to go onto work nightshift and walk around without any adverse symptoms. He has not had any chest pain, edema, orthopnea. He works at a [employer], [employer]. He has been eating and drinking normally. He drinks 8 times per month, 12 beers at that time, but used to drink more heavily. He has not had to use Lasix in long time. He feels fine this afternoon without acute complaint. No s/sx bleeding.  I reached out to device team before his appointment for device interrogation. Per  device team, "His detections is at 171 at 40 intervals and it is not going to shock him until it reach 200 at 30 intervals. Ventricular histograms shows pvcs at going 140 and 150 bpm less than 10%. No alerts. AF burden is 0%. Fluid level looks great. Bi-V pacing is >99%. " This was reviewed by Dr. Ladona Ridgel who did not report any concerning findings based on this assessment, however stated it is possible that he could be experiencing  slow VT or atrial fib below his detection zone. We discussed the dyssynchrony seen on 03/2022 echo and Dr. Ladona Ridgel recommended he also return to see EP non urgently to help adjust lead settings to optimize LV pacing.   Orthostatic VS: Lying HR 64, BP 106/70 Sitting HR 73, BP 99/64 Standing 22m HR 84, BP 88/62 Standing 64m HR 84, BP 92/63   Labwork independently reviewed: 12/2022 K 4.4, Cr 1.08, LFTs ok, A1C 6.7, lipids followed by PCP with LDL 33, trig 78 11/2022 WBC 11.0, Hgb 16.1, plt 182 2022 TSH wnl 2017 HIV nonreactive  ROS: .    Please see the history of present illness.  All other systems are reviewed and otherwise negative.  Studies Reviewed: Marland Kitchen    EKG:  EKG is ordered today, personally reviewed, demonstrating  Atrial sensed, V paced rhythm with occasional AV-pacing, 66bpm, occasional PVC  CV Studies: Cardiac studies reviewed are outlined and summarized above. Otherwise please see EMR for full report.   Current Reported Medications:.    Current Meds  Medication Sig   allopurinol (ZYLOPRIM) 300 MG tablet TAKE 1 TABLET TWICE A DAY   ASPIRIN LOW DOSE 81 MG tablet TAKE 1 TABLET DAILY   carvedilol (COREG) 25 MG tablet TAKE 1 TABLET TWICE A DAY WITH MEALS   clotrimazole-betamethasone (LOTRISONE) cream Apply 1 Application topically 2 (two) times daily as needed (rash).   fluticasone (FLONASE) 50 MCG/ACT nasal spray Place 1 spray into both nostrils daily as needed for allergies or rhinitis.   furosemide (LASIX) 40 MG tablet TAKE ONE TABLET BY MOUTH TWICE A WEEK ON MONDAYS AND THURSDAYS   glipiZIDE (GLUCOTROL XL) 10 MG 24 hr tablet TAKE 2 TABLETS DAILY WITH BREAKFAST   ibuprofen (ADVIL) 200 MG tablet Take 400 mg by mouth every 6 (six) hours as needed for moderate pain.   levothyroxine (SYNTHROID) 50 MCG tablet TAKE 1 TABLET DAILY BEFORE BREAKFAST   losartan (COZAAR) 25 MG tablet Take 1 tablet (25 mg total) by mouth daily.   magnesium oxide (MAG-OX) 400 MG tablet Take 2 tablets (800  mg total) by mouth at bedtime. (Patient taking differently: Take 800 mg by mouth at bedtime as needed (cramps).)   rosuvastatin (CRESTOR) 5 MG tablet TAKE 1 TABLET DAILY   Semaglutide (RYBELSUS) 14 MG TABS Take 1 tablet (14 mg total) by mouth daily.   spironolactone (ALDACTONE) 25 MG tablet Take 2 tablets (50 mg total) by mouth daily.   SYNJARDY XR 25-1000 MG TB24 TAKE 1 TABLET DAILY    Physical Exam:    VS:  BP 102/78 (BP Location: Left Arm, Patient Position: Sitting, Cuff Size: Large)   Pulse 72   Ht 5\' 10"  (1.778 m)   Wt 267 lb (121.1 kg)   SpO2 96%   BMI 38.31 kg/m    Wt Readings from Last 3 Encounters:  10/09/23 267 lb (121.1 kg)  05/02/23 268 lb (121.6 kg)  04/18/23 268 lb (121.6 kg)    GEN: Well nourished, well developed in no acute distress NECK: No  JVD; No carotid bruits CARDIAC: RRR, no murmurs, rubs, gallops RESPIRATORY:  Clear to auscultation without rales, wheezing or rhonchi  ABDOMEN: Soft, non-tender, non-distended EXTREMITIES:  No edema; No acute deformity   Asessement and Plan:.    1. Palpitations, tachycardia, hypotension - orthostatic to the high 80s on standing here. Symptoms concerning for arrhythmia +/- reflexive symptoms to hypotension of unclear provocation. Palpitations with HR in the 1teens-120s have occurred exclusively when he is sitting but he is also demonstrating orthostatic drop in BP/rise in HR (max 80s) today in clinic. I discussed my concern with Dr. Ladona Ridgel who has cared for the patient for many years. He did not feel the patient needed to be sent to the hospital acutely since he is feeling well currently but does recommend medicine changes with close follow-up - per our discussion, decrease carvedilol to 12.5mg  BID (from 25mg  BID), decrease spironolactone to 12.5mg  daily (from 50mg  daily), hold losartan, and hold Lasix (has not needed to take Lasix recently for several months). We have placed a 14 day live Zio here in the office. We were unable to  obtain stat labs in time for either [facility] or the main hospital lab to close, so he will return first thing in AM to have these drawn. ER precautions reviewed. I also wrote him out of work until he is seen back in clinic next week for close recheck of BP/HR. If he is found to have any life threatening arrhythmias on his Zio, recommend ER. If he is found to have evidence of atrial fib, will need to discuss anticoagulation if labs OK.  2. Chronic HFrEF, presumptive nonischemic cardiomyopathy - he was diagnosed in his early 30s. I do not see a clear etiology defined. Possibly related to the excess ETOH he was drinking at that time. He still drinks but significantly less than he used to, which we discussed reducing to elimination today. Marland Kitchen He had prior nuclear stress test as outlined above, with scar and periinfarct ischemia versus artifact. He has not had repeat ischemic evaluation since his EF slightly declined further in 2023. He also had dyssynchrony noted on echo despite BiV ICD (see #3). There is mention of prior scattered granulomatous disease on 2017 CTA. Also has peripheral neuropathy. I discussed his clinical case with HF physician Dr. Gala Romney who recommends further workup in this patient. He had normal HIV in 2017. Will update basic labs as well as TSH, ferritin and transferrin saturation (to exclude hemachromatosis given high normal Hgb in the past). Will pursue FDG-PET to evaluate for sarcoidosis as his ICD was confirmed not to be MRI compatible. Will also refer to the Advanced HF team for further workup and management given his young age and recent issues. He otherwise appears euvolemic. Well perfused on exam. No recent syncope or ICD shocks. GDMT is being interrupted due to reasons above.  3. PVCs, dyssynchrony - Dr. Ladona Ridgel did not feel PVC burden was significant but did recommend that he return to see EP for device adjustment given the dyssynchrony seen on 2023 echocardiogram. Will check lytes and  TSH with labs.  4. Mild dilation of ascending aorta - deferred acute follow-up at this time, recommend review plan for reassessment in follow-up. Consider repeat echo or CTA to formally evaluate dimension depending on labs. Pulses equal. No sx to suggest dissection.    Disposition:  1) Will arrange close f/u in clinic 10/15/23 for BP/HR re-eval, needs to be seen prior to returning to work given symptoms/orthostasis 2) Arrange next  available EP f/u for device optimization for dyssynchrony 3) Refer to Advanced HF clinic  Signed, Laurann Montana, PA-C

## 2023-10-09 NOTE — Patient Instructions (Addendum)
 Medication Instructions:  Stop Lasix   Stop Losartan    *If you need a refill on your cardiac medications before your next appointment, please call your pharmacy*  Lab Work: BMET CBC TSH Ferritin Transferritin saturation  If you have labs (blood work) drawn today and your tests are completely normal, you will receive your results only by: MyChart Message (if you have MyChart) OR A paper copy in the mail If you have any lab test that is abnormal or we need to change your treatment, we will call you to review the results.  Testing/Procedures: Cardiac PET CT   ZIO AT Long term monitor-Live Telemetry  Your physician has requested you wear a ZIO patch monitor for 14 days.  This is a single patch monitor. [employer] supplies one patch monitor per enrollment. Additional  stickers are not available.  Please do not apply patch if you will be having a Nuclear Stress Test, Echocardiogram, Cardiac CT, MRI,  or Chest Xray during the period you would be wearing the monitor. The patch cannot be worn during  these tests. You cannot remove and re-apply the ZIO AT patch monitor.  Your ZIO patch monitor will be mailed 3 day USPS to your address on file. It may take 3-5 days to  receive your monitor after you have been enrolled.  Once you have received your monitor, please review the enclosed instructions. Your monitor has  already been registered assigning a specific monitor serial # to you.   Billing and Patient Assistance Program information  Sanna Crystal has been supplied with any insurance information on record for billing. [employer] offers a sliding scale Patient Assistance Program for patients without insurance, or whose  insurance does not completely cover the cost of the ZIO patch monitor. You must apply for the  Patient Assistance Program to qualify for the discounted rate. To apply, call [employer] at [phone],  select option 4, select option 2 , ask to apply for the Patient Assistance Program,  (you can request an  interpreter if needed). [employer] will ask your household income and how many people are in your  household. [employer] will quote your out-of-pocket cost based on this information. They will also be able  to set up a 12 month interest free payment plan if needed.  Applying the monitor   Shave hair from upper left chest.  Hold the abrader disc by orange tab. Rub the abrader in 40 strokes over left upper chest as indicated in  your monitor instructions.  Clean area with 4 enclosed alcohol pads. Use all pads to ensure the area is cleaned thoroughly. Let  dry.  Apply patch as indicated in monitor instructions. Patch will be placed under collarbone on left side of  chest with arrow pointing upward.  Rub patch adhesive wings for 2 minutes. Remove the white label marked "1". Remove the white label  marked "2". Rub patch adhesive wings for 2 additional minutes.  While looking in a mirror, press and release button in center of patch. A small green light will flash 3-4  times. This will be your only indicator that the monitor has been turned on.  Do not shower for the first 24 hours. You may shower after the first 24 hours.  Press the button if you feel a symptom. You will hear a small click. Record Date, Time and Symptom in  the Patient Log.   Starting the Gateway  In your kit there is a Audiological scientist box the size of a cellphone. This is your  Gateway. It transmits all your  recorded data to [facility]. This box must always stay within 10 feet of you. Open the box and push the *  button. There will be a light that blinks orange and then green a few times. When the light stops  blinking, the Gateway is connected to the ZIO patch. Call [employer] at [phone] to confirm your monitor is transmitting.  Returning your monitor  Remove your patch and place it inside the Gateway. In the lower half of the Gateway there is a white  bag with prepaid postage on it. Place Gateway in bag and  seal. Mail package back to [facility] as soon as  possible. Your physician should have your final report approximately 7 days after you have mailed back  your monitor. Call [facility] Customer Care at [phone] if you have questions regarding your ZIO AT  patch monitor. Call them immediately if you see an orange light blinking on your monitor.  If your monitor falls off in less than 4 days, contact our Monitor department at [phone]. If your  monitor becomes loose or falls off after 4 days call [employer] at [phone] for suggestions on  securing your monitor   Follow-Up: At [facility], you and your health needs are our priority.  As part of our continuing mission to provide you with exceptional heart care, we have created designated Provider Care Teams.  These Care Teams include your primary Cardiologist (physician) and Advanced Practice Providers (APPs -  Physician Assistants and Nurse Practitioners) who all work together to provide you with the care you need, when you need it.  We recommend signing up for the patient portal called "MyChart".  Sign up information is provided on this After Visit Summary.  MyChart is used to connect with patients for Virtual Visits (Telemedicine).  Patients are able to view lab/test results, encounter notes, upcoming appointments, etc.  Non-urgent messages can be sent to your provider as well.   To learn more about what you can do with MyChart, go to [email or web address].    Your next appointment:   Next available EP follow up   1 week follow up with Lovette Rud, PA   The format for your next appointment:   In Person  Provider:   Lovette Rud, PA-C

## 2023-10-09 NOTE — Telephone Encounter (Signed)
 LMOVM for pt to send a manual transmission with his remote monitor before his appointment today.   I called his wife as well and it went to voicemail.

## 2023-10-09 NOTE — Progress Notes (Unsigned)
 ZIOAT serial # [id number] from office inventory applied to patient. Dr. Carolynne Citron to read.

## 2023-10-10 NOTE — Telephone Encounter (Signed)
Spoke with pt at the request of Ronie Spies, PA-C regarding completion of labs.  Pt states he had labs completed at the [facility] this morning.  Pt advised will make Dayna Dunn aware and he will be contacted once lab results are available and reviewed by provider.  Pt verbalizes understanding and thanked Charity fundraiser for the call.

## 2023-10-11 LAB — MAGNESIUM: Magnesium: 2 mg/dL (ref 1.6–2.3)

## 2023-10-12 ENCOUNTER — Emergency Department (HOSPITAL_COMMUNITY)
Admission: EM | Admit: 2023-10-12 | Discharge: 2023-10-13 | Disposition: A | Discharge status: Home / Self Care | Payer: Managed Care, Other (non HMO) | Attending: Emergency Medicine | Admitting: Emergency Medicine

## 2023-10-12 ENCOUNTER — Emergency Department (HOSPITAL_COMMUNITY): Payer: Managed Care, Other (non HMO)

## 2023-10-12 ENCOUNTER — Other Ambulatory Visit: Payer: Self-pay

## 2023-10-12 ENCOUNTER — Encounter (HOSPITAL_COMMUNITY): Payer: Self-pay | Admitting: Emergency Medicine

## 2023-10-12 DIAGNOSIS — I5020 Unspecified systolic (congestive) heart failure: Secondary | ICD-10-CM | POA: Diagnosis not present

## 2023-10-12 DIAGNOSIS — Z7982 Long term (current) use of aspirin: Secondary | ICD-10-CM | POA: Insufficient documentation

## 2023-10-12 DIAGNOSIS — R002 Palpitations: Secondary | ICD-10-CM | POA: Insufficient documentation

## 2023-10-12 DIAGNOSIS — I110 Hypertensive heart disease with heart failure: Secondary | ICD-10-CM | POA: Insufficient documentation

## 2023-10-12 DIAGNOSIS — E119 Type 2 diabetes mellitus without complications: Secondary | ICD-10-CM | POA: Insufficient documentation

## 2023-10-12 DIAGNOSIS — Z79899 Other long term (current) drug therapy: Secondary | ICD-10-CM | POA: Diagnosis not present

## 2023-10-12 LAB — BRAIN NATRIURETIC PEPTIDE: B Natriuretic Peptide: 37.7 pg/mL (ref 0.0–100.0)

## 2023-10-12 LAB — CBC WITH DIFFERENTIAL/PLATELET
Abs Immature Granulocytes: 0.04 10*3/uL (ref 0.00–0.07)
Basophils Absolute: 0 10*3/uL (ref 0.0–0.1)
Basophils Relative: 0 %
Eosinophils Absolute: 0.3 10*3/uL (ref 0.0–0.5)
Eosinophils Relative: 3 %
HCT: 47.6 % (ref 39.0–52.0)
Hemoglobin: 16.8 g/dL (ref 13.0–17.0)
Immature Granulocytes: 0 %
Lymphocytes Relative: 20 %
Lymphs Abs: 2.1 10*3/uL (ref 0.7–4.0)
MCH: 32.1 pg (ref 26.0–34.0)
MCHC: 35.3 g/dL (ref 30.0–36.0)
MCV: 90.8 fL (ref 80.0–100.0)
Monocytes Absolute: 0.7 10*3/uL (ref 0.1–1.0)
Monocytes Relative: 7 %
Neutro Abs: 7.2 10*3/uL (ref 1.7–7.7)
Neutrophils Relative %: 70 %
Platelets: 203 10*3/uL (ref 150–400)
RBC: 5.24 MIL/uL (ref 4.22–5.81)
RDW: 13.8 % (ref 11.5–15.5)
WBC: 10.4 10*3/uL (ref 4.0–10.5)
nRBC: 0 % (ref 0.0–0.2)

## 2023-10-12 LAB — D-DIMER, QUANTITATIVE: D-Dimer, Quant: 0.76 ug{FEU}/mL — ABNORMAL HIGH (ref 0.00–0.50)

## 2023-10-12 NOTE — ED Provider Notes (Signed)
Andre [name] Provider Note   CSN: [id number] Arrival date & time: 10/12/23  2253     History {Add pertinent medical, surgical, social history, OB history to HPI:1} Chief Complaint  Patient presents with   Tachycardia    Andre [name]. is a 58 y.o. male.  58 yo M w/ LBBB, NICM, HFrEF s/p Abbot CRT-D 2016, mild dilation of ascending aorta, HTN, PVCs, DM, gout, peripheral neuropathy here with palpitations.  Patient states that these had these episodes here recently.  He actually saw cardiology for about a week ago.  He will have episodes last anywhere from 5 to 20 minutes of palpitations in the 1 20-1 25 range.  Will brought him in tonight is that he is worried because it lasted so long and he was short of breath and sweaty with it which she was not before.  No chest pain.  No lightheadedness.  No nausea.  Did not really do any thing in particular to provoke it nor has he recently.  They did decrease his carvedilol and spironolactone and then the cardiology office the other day but he was having symptoms prior to that.  No over-the-counter medications.  He drinks alcohol intermittently but has not had any in January.  No tobacco or illicit drugs.  No new over-the-counter drugs.  No recent illnesses such as cough, fever, runny nose, diarrhea or, constipation, nausea or rashes.  Patient overall feels well otherwise.  No particular inciting factors.        Home Medications Prior to Admission medications   Medication Sig Start Date End Date Taking? Authorizing Provider  allopurinol (ZYLOPRIM) 300 MG tablet TAKE 1 TABLET TWICE A DAY 03/06/23   Monica Becton, MD  ASPIRIN LOW DOSE 81 MG tablet TAKE 1 TABLET DAILY 03/19/23   Monica Becton, MD  carvedilol (COREG) 12.5 MG tablet Take 1 tablet (12.5 mg total) by mouth 2 (two) times daily with a meal. 10/09/23   Dunn, Tacey Ruiz, PA-C  clotrimazole-betamethasone (LOTRISONE) cream Apply 1  Application topically 2 (two) times daily as needed (rash).    [provider]  fluticasone (FLONASE) 50 MCG/ACT nasal spray Place 1 spray into both nostrils daily as needed for allergies or rhinitis.    [provider]  glipiZIDE (GLUCOTROL XL) 10 MG 24 hr tablet TAKE 2 TABLETS DAILY WITH BREAKFAST 07/09/23   Monica Becton, MD  ibuprofen (ADVIL) 200 MG tablet Take 400 mg by mouth every 6 (six) hours as needed for moderate pain.    [provider]  levothyroxine (SYNTHROID) 50 MCG tablet TAKE 1 TABLET DAILY BEFORE BREAKFAST 04/26/23   Monica Becton, MD  magnesium oxide (MAG-OX) 400 MG tablet Take 2 tablets (800 mg total) by mouth at bedtime. Patient taking differently: Take 800 mg by mouth at bedtime as needed (cramps). 07/14/21   Monica Becton, MD  rosuvastatin (CRESTOR) 5 MG tablet TAKE 1 TABLET DAILY 10/07/23   Monica Becton, MD  Semaglutide (RYBELSUS) 14 MG TABS Take 1 tablet (14 mg total) by mouth daily. 04/18/23   Monica Becton, MD  spironolactone (ALDACTONE) 25 MG tablet Take 0.5 tablets (12.5 mg total) by mouth daily. 10/09/23   Laurann Montana, PA-C  SYNJARDY XR 25-1000 MG TB24 TAKE 1 TABLET DAILY 04/26/23   Monica Becton, MD      Allergies    Patient has no known allergies.    Review of Systems   Review  of Systems  Physical Exam Updated Vital Signs BP (!) 163/102 (BP Location: Right Arm)   Pulse 79   Temp 98.3 F (36.8 C) (Oral)   Resp 18   Ht 5\' 10"  (1.778 m)   Wt 117.9 kg   SpO2 99%   BMI 37.31 kg/m  Physical Exam Vitals and nursing note reviewed.  Constitutional:      Appearance: He is well-developed.  HENT:     Head: Normocephalic and atraumatic.  Cardiovascular:     Rate and Rhythm: Normal rate.  Pulmonary:     Effort: Pulmonary effort is normal. No respiratory distress.  Abdominal:     General: There is no distension.  Musculoskeletal:        General: Normal range of motion.      Cervical back: Normal range of motion.  Neurological:     Mental Status: He is alert.     ED Results / Procedures / Treatments   Labs (all labs ordered are listed, but only abnormal results are displayed) Labs Reviewed  CBC WITH DIFFERENTIAL/PLATELET  COMPREHENSIVE METABOLIC PANEL  MAGNESIUM  TSH  T4, FREE  D-DIMER, QUANTITATIVE  BRAIN NATRIURETIC PEPTIDE    EKG None  Radiology No results found.  Procedures Procedures    Medications Ordered in ED Medications - No data to display  ED Course/ Medical Decision Making/ A&P                                 Medical Decision Making Amount and/or Complexity of Data Reviewed Labs: ordered. Radiology: ordered.  Will attempt to interrogate defibrillator however it sounds like the settings may not allow it based on previous cardiology note. No obvious changes in lifestyle to explain increase in palpitation episodes such as illness, caffeine, stimulants, stress, etc. Cardiology pondered possible slow afib however patient states his watch doesn't mention anything about irregular or possibly slow vtach. Whatever it is, it seems unlikely to be sinus with being so symptomatic. Will check lytes, hemoglobin, thyroid studies.  ***  {Document critical care time when appropriate:1} {Document review of labs and clinical decision tools ie heart score, Chads2Vasc2 etc:1}  {Document your independent review of radiology images, and any outside records:1} {Document your discussion with family members, caretakers, and with consultants:1} {Document social determinants of health affecting pt's care:1} {Document your decision making why or why not admission, treatments were needed:1} Final Clinical Impression(s) / ED Diagnoses Final diagnoses:  None    Rx / DC Orders ED Discharge Orders     None

## 2023-10-12 NOTE — ED Triage Notes (Signed)
Patient BIBA from home c/o for bouts of tachycardia. Was placed on a temp heart monitor since Wednesday. His smart watch alerted him twice today and felt cold and clammy. HR for patient was 140. EMS-gave 320 Aspirin. Hx: CHF, V-pacemaker with defib  BP-142/100 HR-70s.

## 2023-10-13 ENCOUNTER — Emergency Department (HOSPITAL_COMMUNITY): Payer: Managed Care, Other (non HMO)

## 2023-10-13 LAB — COMPREHENSIVE METABOLIC PANEL
ALT: 45 U/L — ABNORMAL HIGH (ref 0–44)
AST: 54 U/L — ABNORMAL HIGH (ref 15–41)
Albumin: 3.7 g/dL (ref 3.5–5.0)
Alkaline Phosphatase: 45 U/L (ref 38–126)
Anion gap: 11 (ref 5–15)
BUN: 12 mg/dL (ref 6–20)
CO2: 21 mmol/L — ABNORMAL LOW (ref 22–32)
Calcium: 8.9 mg/dL (ref 8.9–10.3)
Chloride: 103 mmol/L (ref 98–111)
Creatinine, Ser: 1.07 mg/dL (ref 0.61–1.24)
GFR, Estimated: >60 mL/min (ref >60)
Glucose, Bld: 134 mg/dL — ABNORMAL HIGH (ref 70–99)
Potassium: 4.6 mmol/L (ref 3.5–5.1)
Sodium: 135 mmol/L (ref 135–145)
Total Bilirubin: 1.6 mg/dL — ABNORMAL HIGH (ref 0.0–1.2)
Total Protein: 7 g/dL (ref 6.5–8.1)

## 2023-10-13 LAB — T4, FREE: Free T4: 0.87 ng/dL (ref 0.61–1.12)

## 2023-10-13 LAB — MAGNESIUM: Magnesium: 2 mg/dL (ref 1.7–2.4)

## 2023-10-13 LAB — TSH: TSH: 1.341 u[IU]/mL (ref 0.350–4.500)

## 2023-10-13 MED ORDER — IOHEXOL 350 MG/ML SOLN
75.0000 mL | Freq: Once | INTRAVENOUS | Status: AC | PRN
  Administered 2023-10-13: 75 mL via INTRAVENOUS

## 2023-10-14 LAB — BASIC METABOLIC PANEL WITH GFR
BUN: 16 mg/dL (ref 6–24)
Calcium: 9.5 mg/dL (ref 8.7–10.2)
Chloride: 99 mmol/L (ref 96–106)
Creatinine, Ser: 1.14 mg/dL (ref 0.76–1.27)
Sodium: 139 mmol/L (ref 134–144)
eGFR: 76 mL/min/{1.73_m2} (ref >59)

## 2023-10-14 LAB — CBC
Hematocrit: 49.5 % (ref 37.5–51.0)
Hemoglobin: 17.1 g/dL (ref 13.0–17.7)
MCH: 32 pg (ref 26.6–33.0)
MCHC: 34.5 g/dL (ref 31.5–35.7)
MCV: 93 fL (ref 79–97)
Platelets: 203 10*3/uL (ref 150–450)
RBC: 5.34 x10E6/uL (ref 4.14–5.80)
RDW: 13.3 % (ref 11.6–15.4)
WBC: 8.8 10*3/uL (ref 3.4–10.8)

## 2023-10-14 LAB — BASIC METABOLIC PANEL
BUN/Creatinine Ratio: 14 (ref 9–20)
CO2: 21 mmol/L (ref 20–29)
Glucose: 188 mg/dL — ABNORMAL HIGH (ref 70–99)
Potassium: 4.2 mmol/L (ref 3.5–5.2)

## 2023-10-14 LAB — TRANSFERRIN SATURATION
IRON SATN MFR SERPL: 23 %{saturation}
IRON SERPL-MCNC: 85 ug/dL
TRANSFERRIN SERPL-MCNC: 265 mg/dL

## 2023-10-14 LAB — FERRITIN: Ferritin: 287 ng/mL (ref 30–400)

## 2023-10-14 LAB — TSH: TSH: 1.8 u[IU]/mL (ref 0.450–4.500)

## 2023-10-14 NOTE — Progress Notes (Unsigned)
  Electrophysiology Office Note:   ID:  Andre Mcglocklin., DOB Jul 10, 1966, MRN [id number]  Primary Cardiologist: Dietrich Pates, MD Electrophysiologist: Lewayne Bunting, MD  {Click to update primary MD,subspecialty MD or APP then REFRESH:1}    History of Present Illness:   Andre [name]. is a 58 y.o. male with h/o LBBB, NICM, HFrEF s/p ICD, and HTN seen today for routine electrophysiology followup.   Seen in [facility] clinic 10/09/2023 with palpitations, tachycardia, and hypotension. Med adjustments made and discussed with Dr. Ladona Ridgel who recommended device optimization attempt given dyssynchrony on Echo 2023.  Since last being seen in our clinic the patient reports doing ***.  he denies chest pain, palpitations, dyspnea, PND, orthopnea, nausea, vomiting, dizziness, syncope, edema, weight gain, or early satiety.   Review of systems complete and found to be negative unless listed in HPI.   EP Information / Studies Reviewed:    EKG is not ordered today. EKG from 10/11/2022 reviewed which showed NSR @ 73 bpm with RBBB/IVCD of 188 ms.  Atypical appearing RBBB in V1, mostly upright in lead 1.       ICD Interrogation-  reviewed in detail today,  See PACEART report.  Device History: Abbott CRT-D, implant 12/2014; dx NICM, LBBB   Echo 03/2022 Showed LVEF 35-40%, with "notable" dyssynchrony", trivial MR  Physical Exam:   VS:  There were no vitals taken for this visit.   Wt Readings from Last 3 Encounters:  10/12/23 260 lb (117.9 kg)  10/09/23 267 lb (121.1 kg)  05/02/23 268 lb (121.6 kg)     GEN: No acute distress *** NECK: No JVD; No carotid bruits CARDIAC: {EPRHYTHM:28826}, no murmurs, rubs, gallops RESPIRATORY:  Clear to auscultation without rales, wheezing or rhonchi  ABDOMEN: Soft, non-tender, non-distended EXTREMITIES:  {EDEMA LEVEL:28147::"No"} edema; No deformity   ASSESSMENT AND PLAN:    Chronic systolic CHF  s/p Abbott CRT-D  NICM LBBB euvolemic today Stable on an  appropriate medical regimen Normal ICD function See Pace Art report No changes today  Disposition:   Follow up with {EPPROVIDERS:28135} {EPFOLLOW UP:28173}   Signed, Graciella Freer, PA-C

## 2023-10-14 NOTE — Telephone Encounter (Addendum)
Please facilitate DOD appt today if possible. Discharging BP appears normal from ED. BP meds recently adjusted at office visit per d/w Dr. Ladona Ridgel due to orthostasis. Likely needs gradual restart.

## 2023-10-15 ENCOUNTER — Telehealth: Payer: Self-pay | Admitting: Internal Medicine

## 2023-10-15 ENCOUNTER — Ambulatory Visit: Payer: Managed Care, Other (non HMO) | Admitting: Student

## 2023-10-15 ENCOUNTER — Ambulatory Visit: Payer: Managed Care, Other (non HMO) | Attending: Physician Assistant | Admitting: Physician Assistant

## 2023-10-15 ENCOUNTER — Encounter: Payer: Self-pay | Admitting: Student

## 2023-10-15 ENCOUNTER — Encounter: Payer: Self-pay | Admitting: Physician Assistant

## 2023-10-15 VITALS — BP 100/68 | HR 72 | Ht 70.0 in | Wt 267.8 lb

## 2023-10-15 VITALS — BP 108/78 | HR 97 | Ht 70.0 in | Wt 268.9 lb

## 2023-10-15 DIAGNOSIS — R000 Tachycardia, unspecified: Secondary | ICD-10-CM

## 2023-10-15 DIAGNOSIS — I428 Other cardiomyopathies: Secondary | ICD-10-CM

## 2023-10-15 DIAGNOSIS — I493 Ventricular premature depolarization: Secondary | ICD-10-CM

## 2023-10-15 DIAGNOSIS — I77810 Thoracic aortic ectasia: Secondary | ICD-10-CM

## 2023-10-15 DIAGNOSIS — I5022 Chronic systolic (congestive) heart failure: Secondary | ICD-10-CM | POA: Diagnosis not present

## 2023-10-15 DIAGNOSIS — R002 Palpitations: Secondary | ICD-10-CM

## 2023-10-15 DIAGNOSIS — Z950 Presence of cardiac pacemaker: Secondary | ICD-10-CM

## 2023-10-15 DIAGNOSIS — I447 Left bundle-branch block, unspecified: Secondary | ICD-10-CM | POA: Diagnosis not present

## 2023-10-15 DIAGNOSIS — Z0279 Encounter for issue of other medical certificate: Secondary | ICD-10-CM

## 2023-10-15 LAB — CUP PACEART INCLINIC DEVICE CHECK
Battery Remaining Longevity: 66 mo
Brady Statistic RA Percent Paced: 6.4 %
Brady Statistic RV Percent Paced: 99.32 %
Date Time Interrogation Session: 20250121143050
HighPow Impedance: 92.25 Ohm
Implantable Lead Connection Status: 753985
Implantable Lead Connection Status: 753985
Implantable Lead Connection Status: 753985
Implantable Lead Implant Date: 20160407
Implantable Lead Implant Date: 20160407
Implantable Lead Implant Date: 20160407
Implantable Lead Location: 753858
Implantable Lead Location: 753859
Implantable Lead Location: 753860
Implantable Lead Model: 7122
Implantable Pulse Generator Implant Date: 20240325
Lead Channel Impedance Value: 387.5 Ohm
Lead Channel Impedance Value: 437.5 Ohm
Lead Channel Impedance Value: 587.5 Ohm
Lead Channel Pacing Threshold Amplitude: 1.5 V
Lead Channel Pacing Threshold Amplitude: 1.5 V
Lead Channel Pacing Threshold Pulse Width: 0.8 ms
Lead Channel Pacing Threshold Pulse Width: 0.8 ms
Lead Channel Sensing Intrinsic Amplitude: 2.9 mV
Lead Channel Sensing Intrinsic Amplitude: 6.6 mV
Lead Channel Setting Pacing Amplitude: 2 V
Lead Channel Setting Pacing Amplitude: 2 V
Lead Channel Setting Pacing Amplitude: 2.5 V
Lead Channel Setting Pacing Pulse Width: 0.5 ms
Lead Channel Setting Pacing Pulse Width: 0.8 ms
Lead Channel Setting Sensing Sensitivity: 0.5 mV
Pulse Gen Serial Number: 5566447
Zone Setting Status: 755011

## 2023-10-15 MED ORDER — METOPROLOL SUCCINATE ER 25 MG PO TB24: 25.0000 mg | ORAL_TABLET | Freq: Two times a day (BID) | ORAL | 3 refills | Status: DC

## 2023-10-15 NOTE — Progress Notes (Signed)
Cardiology Office Note:  .   Date:  10/15/2023  ID:  Andre [name]., DOB 12/17/1965, MRN [id number] PCP: Monica Becton, MD  [facility] Providers Cardiologist:  Dietrich Pates, MD Electrophysiologist:  Lewayne Bunting, MD {  History of Present Illness: .   Andre [name]. is a 58 y.o. male with a past medical history of LBBB, NICM, HFrEF status post Abbott CRT/D 2016, mild dilation of ascending aorta, HTN, PVCs, DM, gout, peripheral neuropathy, prior alcohol abuse here for follow-up appointment.  Was seen 10/09/2023 for elevated heart rate seen on Apple Watch.  History includes longstanding heart failure diagnosed back in 2004 per notes.  Lost to follow-up due to lack of insurance, then reestablished with heart care in 2015.  He was admitted in 2004 with CHF, diuresed, had catheter later at [facility] which was reportedly normal.  Patient reported that he had a cardiac cath around 2006.  He was in his early 30s at the time of diagnosis.  Also drinking alcohol more heavily at that time but denied any illicit drug use.  He also had a negative sleep study in 2006.  Echocardiogram to the thousand 15 showed LVEF 20 to 25%.  Nuclear stress test 03/2014 showed "fixed apical defect, inferior defect particularly fixed with very mild peri-infarct ischemia which could represent variations in diaphragmatic attenuation, EF 20 to 25%.  Dr. Tenny Craw felt that it was reassuring without any significant ischemia and patient was managed medically.  When he was last seen he had done clinically well and medically managed.  Underwent CRT-D in 2016 which he has been followed by EP (Dr. Ladona Ridgel).  Echo in 2018 showed EF 40 to 45%.  Last echo 03/2022 showed decline in EF down to 35 to 40% with notable dyssynchrony, mild LVH, mildly enlarged RV, mild dilation of the ascending aorta.  Had a generator change early 11/2022.  Entresto not previously pursued as Dr. Tenny Craw was concerned with his blood pressure and not being  able to tolerate it.  Reportedly lost 50 pounds in last few years (he weighed greater than 300 pounds in 2021) he.  Weights been stable over the last year.  Father and maternal grandmother had CAD.  No history of SCD, sarcoidosis, or heart failure.  Recently, he sent a MyChart message 10/06/2022 regarding episode of elevated heart rate and was scheduled for an appointment with Ronie Spies, PA-C.  8 episodes since starting on Saturday where he developed palpitations and heart rate in the 1 teens to 120s by Apple Watch just sitting watching TV.  All the episodes happen at rest and resolved without intervention.  First 1 was associated with SOB and diaphoresis.  States that he went to get his wife and lay down and felt better within 2 minutes.  He is at 1-2 episodes per day since then lasting a few minutes up to 15 minutes.  Bladder episodes were not associated with any other symptoms.  Denies ICD shock.  He does report drinking 8 times per month around 12 beers at a time but used to drink more heavily.  Not used Lasix in a long time.  The device team reportedly said his detections at 171 at 40 intervals and is not going to shock him until it reaches 200 at 30 intervals.  Ventricular histograms showed PVCs at going 140 and 150 bpm less than 10%.  No alerts.  No A-fib.  Fluid levels look great.  BiV pacing is greater than 99%.  This was reviewed by Dr. Ladona Ridgel who did not report any concerning findings based on his assessment.  Dr. Ladona Ridgel recommended he return to see EP nonurgently to help adjust lead settings and to optimize LV pacing.  Today, he presents with a known history of heart disease and an implanted cardiac defibrillator  with recent episodes of elevated heart rate. These episodes have been occurring more frequently over the past few weeks, with the patient experiencing symptoms such as shortness of breath and sweating. The patient's heart rate has been monitored using an Apple Watch and a Zio monitor, (in  place today) with rates reaching as high as 120-127 bpm during these episodes. The patient's blood pressure has also been noted to be significantly elevated during these episodes, with one reading as high as 270/140 mmHg (per patient report).   The patient's medication regimen was recently adjusted, with a reduction in the dosage of carvedilol and spironolactone. Since this adjustment, the patient has noted an increase in the frequency of his elevated heart rate episodes.  The patient's wife is also present during the consultation and provides additional information about the patient's symptoms and medication regimen  Reports no chest pain, pressure, or tightness. No edema, orthopnea, PND.   Discussed the use of AI scribe software for clinical note transcription with the patient, who gave verbal consent to proceed.  ROS: pertinent ROS in HPI  Studies Reviewed: Marland Kitchen       CTA to r/p PE 10/13/23  FINDINGS: Cardiovascular: Left AICD in place with leads in the right atrium and right ventricle. No filling defects in the pulmonary arteries to suggest pulmonary emboli. Heart borderline in size. Aorta normal caliber.   Mediastinum/Nodes: No mediastinal, hilar, or axillary adenopathy. Trachea and esophagus are unremarkable. Thyroid unremarkable.   Lungs/Pleura: No confluent airspace opacities or effusions. No suspicious pulmonary nodules.   Upper Abdomen: No acute findings   Musculoskeletal: Chest wall soft tissues are unremarkable. No acute bony abnormality.   Review of the MIP images confirms the above findings.   IMPRESSION: No evidence of pulmonary embolus.   No acute cardiopulmonary disease.     Electronically Signed   By: Andre [name] M.D.   On: 10/13/2023 01:37       Physical Exam:   VS:  BP 108/78   Pulse 97   Ht 5\' 10"  (1.778 m)   Wt 268 lb 14.4 oz (122 kg)   SpO2 97%   BMI 38.58 kg/m    Wt Readings from Last 3 Encounters:  10/15/23 268 lb 14.4 oz (122 kg)  10/15/23  267 lb 12.8 oz (121.5 kg)  10/12/23 260 lb (117.9 kg)    GEN: Well nourished, well developed in no acute distress NECK: No JVD; No carotid bruits CARDIAC: RRR, no murmurs, rubs, gallops RESPIRATORY:  Clear to auscultation without rales, wheezing or rhonchi  ABDOMEN: Soft, non-tender, non-distended EXTREMITIES:  No edema; No deformity   ASSESSMENT AND PLAN: .     Tachycardia Recent episodes of elevated heart rate with associated symptoms of shortness of breath and sweating. Currently wearing a Zio monitor for further evaluation. EKG shows sinus rhythm with a bundle branch block, which appears to be a stable finding from previous EKGs. -Continue monitoring with Zio device. -Change metoprolol succinate 25mg  BID -Check heart rate and blood pressure at home and report any significant changes.  Hypertension Recent episode of extremely high blood pressure (270/140) leading to ER visit. Currently on carvedilol, which was recently reduced from 25mg  twice  daily to 12.5mg  twice daily. -Change from carvedilol to metoprolol succinate 25mg  BID -Monitor blood pressure at home and report any significant changes.  Heart Failure Decreased heart pump function noted on previous echocardiogram (35-40%). -Order updated echocardiogram to assess current heart structure and function.  Sarcoidosis Previous mention of PET scan for sarcoidosis. -Confirm order for PET CT scan and ensure patient is contacted for scheduling.  Follow-up Monitor response to medication change and results of ongoing monitoring and testing. -Return to clinic for follow-up after results of Zio monitor, echocardiogram, and PET CT scan are available.      Signed, Sharlene Dory, PA-C

## 2023-10-15 NOTE — Telephone Encounter (Signed)
Pts wife dropped off fmla form for [employer]  benefit. Pt has paid. Forms will be in providers box by eod. Copies have been made and are in the clear/purple abc folder. Please fax when complete.

## 2023-10-15 NOTE — Patient Instructions (Signed)
Medication Instructions:  Your physician recommends that you continue on your current medications as directed. Please refer to the Current Medication list given to you today.  *If you need a refill on your cardiac medications before your next appointment, please call your pharmacy*  Lab Work: None ordered If you have labs (blood work) drawn today and your tests are completely normal, you will receive your results only by: MyChart Message (if you have MyChart) OR A paper copy in the mail If you have any lab test that is abnormal or we need to change your treatment, we will call you to review the results.  Follow-Up: At [facility], you and your health needs are our priority.  As part of our continuing mission to provide you with exceptional heart care, we have created designated Provider Care Teams.  These Care Teams include your primary Cardiologist (physician) and Advanced Practice Providers (APPs -  Physician Assistants and Nurse Practitioners) who all work together to provide you with the care you need, when you need it.  Your next appointment:   6 month(s)  Provider:   Lewayne Bunting, MD

## 2023-10-15 NOTE — Patient Instructions (Signed)
Medication Instructions:  STOP Coreg (Carvediolol) START Metoprolol Succinate 25 mg twice daily  *If you need a refill on your cardiac medications before your next appointment, please call your pharmacy*   Testing/Procedures: Echocardiogram Your physician has requested that you have an echocardiogram. Echocardiography is a painless test that uses sound waves to create images of your heart. It provides your doctor with information about the size and shape of your heart and how well your heart's chambers and valves are working. This procedure takes approximately one hour. There are no restrictions for this procedure. Please do NOT wear cologne, perfume, aftershave, or lotions (deodorant is allowed). Please arrive 15 minutes prior to your appointment time.  Please note: We ask at that you not bring children with you during ultrasound (echo/ vascular) testing. Due to room size and safety concerns, children are not allowed in the ultrasound rooms during exams. Our front office staff cannot provide observation of children in our lobby area while testing is being conducted. An adult accompanying a patient to their appointment will only be allowed in the ultrasound room at the discretion of the ultrasound technician under special circumstances. We apologize for any inconvenience.   Follow-Up: At [facility], you and your health needs are our priority.  As part of our continuing mission to provide you with exceptional heart care, we have created designated Provider Care Teams.  These Care Teams include your primary Cardiologist (physician) and Advanced Practice Providers (APPs -  Physician Assistants and Nurse Practitioners) who all work together to provide you with the care you need, when you need it.  We recommend signing up for the patient portal called "MyChart".  Sign up information is provided on this After Visit Summary.  MyChart is used to connect with patients for Virtual Visits  (Telemedicine).  Patients are able to view lab/test results, encounter notes, upcoming appointments, etc.  Non-urgent messages can be sent to your provider as well.   To learn more about what you can do with MyChart, go to [email or web address].    Your next appointment:   1 month(s)  Provider:   Jari Favre, PA

## 2023-10-18 ENCOUNTER — Telehealth: Payer: Self-pay | Admitting: Physician Assistant

## 2023-10-18 NOTE — Telephone Encounter (Signed)
Received message from our team re: insurance denial of cardiac PET/CT myocardial sarcoidosis study as below:  The service you requested is not covered.  Please Understand: If you have or had this service, your plan will not pay for it.  Why:  We reviewed information from Dr. Dietrich Pates, your benefit plan, and any policies and guidelines  needed to reach this decision. We found the service requested is not medically necessary in your  case:  Your doctor told us that there is a concern related to the blood vessels in your heart. The request  cannot be approved because:   Imaging can be done when one of these conditions apply to you.  -Your body mass index is greater than 40. This is a number that measures body size based on height and weight.  -You have large breasts or implants.  -You are unable to exercise enough to reach a target heart rate due to a physical problem. The details sent to [place] do not describe one of these conditions.  We told your doctor about this. Please talk to your doctor if you have questions.  This finding was based on review of [employer] Cardiac Imaging Guidelines Section(s): Cardiac PET -  Perfusion - Indications (CD 6.2). This request was reviewed by a board certified physician: Baldomero Lamy, M.D. / Title: Associate Medical Director / Specialties: Internal Medicine and Cardiovascular Disease.  If you are the requesting physician, and you would like to request a peer-to-peer conversation, please call our Health Services Department at [phone] to discuss this decision with a physician reviewer using case# [id number].    I contacted the phone number and spoke with nurse reviewer Janee Morn. The above information is incongruent with the objectives of the study ordered. This cardiac PET/CT we ordered is to assess for cardiac sarcoidosis, not ischemia. Cardiac MRI is contraindicated because his ICD is not MRI compatible. Total duration of time on the phone 14  minutes.  Received Authorization [id number] for CPT [phone] valid 1/17-7/16/25. Updated our team via staff msgs to make them aware to move forward with scheduling and notify patient,

## 2023-10-21 ENCOUNTER — Ambulatory Visit: Payer: Managed Care, Other (non HMO) | Admitting: Sports Medicine

## 2023-10-22 NOTE — Telephone Encounter (Signed)
Forms reviewed and placed in Andre [name]'s mailbox for her review when she is back in the office 10/23/23.

## 2023-10-23 ENCOUNTER — Ambulatory Visit: Payer: Managed Care, Other (non HMO) | Admitting: Sports Medicine

## 2023-10-23 ENCOUNTER — Encounter: Payer: Self-pay | Admitting: Sports Medicine

## 2023-10-23 VITALS — BP 135/82 | HR 76 | Ht 70.0 in | Wt 272.0 lb

## 2023-10-23 DIAGNOSIS — E1122 Type 2 diabetes mellitus with diabetic chronic kidney disease: Secondary | ICD-10-CM

## 2023-10-23 DIAGNOSIS — Z7984 Long term (current) use of oral hypoglycemic drugs: Secondary | ICD-10-CM

## 2023-10-23 DIAGNOSIS — E291 Testicular hypofunction: Secondary | ICD-10-CM | POA: Diagnosis not present

## 2023-10-23 DIAGNOSIS — N5201 Erectile dysfunction due to arterial insufficiency: Secondary | ICD-10-CM

## 2023-10-23 DIAGNOSIS — N139 Obstructive and reflux uropathy, unspecified: Secondary | ICD-10-CM | POA: Diagnosis not present

## 2023-10-23 DIAGNOSIS — E119 Type 2 diabetes mellitus without complications: Secondary | ICD-10-CM | POA: Diagnosis not present

## 2023-10-23 LAB — POCT GLYCOSYLATED HEMOGLOBIN (HGB A1C): Hemoglobin A1C: 6.3 % — AB (ref 4.0–5.6)

## 2023-10-23 MED ORDER — TADALAFIL 5 MG PO TABS: 5.0000 mg | ORAL_TABLET | Freq: Every day | ORAL | 11 refills | Status: DC

## 2023-10-23 NOTE — Telephone Encounter (Signed)
[employer] FMLA form faxed to insurance and scanned to chart.  Billing notified.

## 2023-10-23 NOTE — Assessment & Plan Note (Addendum)
Well-controlled, A1c down to 6.3%.  Elevated microalb/creat ratio, awaiting pt input regarding nephrology consult and valsartan.  Already on SGLT2.  Adding valsartan low-dose. Nephrology referral.

## 2023-10-23 NOTE — Progress Notes (Addendum)
    Procedures performed today:    None.  Independent interpretation of notes and tests performed by another provider:   None.  Brief History, Exam, Impression, and Recommendations:    Type 2 diabetes mellitus, controlled, with renal complications (HCC) Well-controlled, A1c down to 6.3%.  Elevated microalb/creat ratio, awaiting pt input regarding nephrology consult and valsartan.  Already on SGLT2.  Adding valsartan low-dose. Nephrology referral.  Erectile dysfunction due to arterial insufficiency We tried to get him Cialis years ago, it was too expensive, Viagra not effective, unable to get [place]. Now that Cialis is generic and cheaper we will try this in the daily formulation, he will avoid nitrates.    ____________________________________________ Ihor Austin. Benjamin Stain, M.D., ABFM., CAQSM., AME. Primary Care and Sports Medicine [facility]  Adjunct Professor of Family Medicine  [employer]  Restaurant manager, fast food

## 2023-10-23 NOTE — Assessment & Plan Note (Signed)
We tried to get him Cialis years ago, it was too expensive, Viagra not effective, unable to get [place]. Now that Cialis is generic and cheaper we will try this in the daily formulation, he will avoid nitrates.

## 2023-10-24 NOTE — Progress Notes (Signed)
Remote ICD transmission.

## 2023-10-25 ENCOUNTER — Encounter (HOSPITAL_COMMUNITY): Payer: Self-pay

## 2023-10-31 ENCOUNTER — Encounter: Payer: Self-pay | Admitting: Sports Medicine

## 2023-10-31 DIAGNOSIS — N5201 Erectile dysfunction due to arterial insufficiency: Secondary | ICD-10-CM

## 2023-10-31 MED ORDER — TADALAFIL 20 MG PO TABS: 20.0000 mg | ORAL_TABLET | ORAL | 11 refills | Status: DC

## 2023-11-01 ENCOUNTER — Telehealth: Payer: Self-pay

## 2023-11-01 NOTE — Telephone Encounter (Signed)
 Copied from CRM [id number]. Topic: Clinical - Prescription Issue >> Oct 31, 2023  4:19 PM Joesph PARAS wrote: Reason for CRM: Tiana with [facility] is calling to request approval for a manufacturer change for prescription levothyroxine  (SYNTHROID ) 50 MCG tablet. Call-back at [phone]. Number goes to tech line that goes to pharmacist - no voicemail.

## 2023-11-01 NOTE — Telephone Encounter (Signed)
 Approved.

## 2023-11-01 NOTE — Telephone Encounter (Signed)
Pharmacy informed of approval.

## 2023-11-03 MED ORDER — TADALAFIL 20 MG PO TABS: 20.0000 mg | ORAL_TABLET | ORAL | 11 refills | Status: AC

## 2023-11-03 NOTE — Addendum Note (Signed)
 Addended by: Gean Keels on: 11/03/2023 06:37 PM   Modules accepted: Orders

## 2023-11-04 LAB — COMPREHENSIVE METABOLIC PANEL
Albumin: 4.4 g/dL (ref 3.8–4.9)
Alkaline Phosphatase: 63 [IU]/L (ref 44–121)
Bilirubin Total: 1.1 mg/dL (ref 0.0–1.2)
CO2: 24 mmol/L (ref 20–29)
Chloride: 99 mmol/L (ref 96–106)
Total Protein: 7.2 g/dL (ref 6.0–8.5)
eGFR: 78 mL/min/{1.73_m2} (ref >59)

## 2023-11-04 LAB — CBC
Hematocrit: 49.5 % (ref 37.5–51.0)
Hemoglobin: 17.1 g/dL (ref 13.0–17.7)
MCH: 31.8 pg (ref 26.6–33.0)
MCHC: 34.5 g/dL (ref 31.5–35.7)
MCV: 92 fL (ref 79–97)
Platelets: 187 10*3/uL (ref 150–450)
RBC: 5.38 x10E6/uL (ref 4.14–5.80)
RDW: 13 % (ref 11.6–15.4)
WBC: 10.3 x10E3/uL (ref 3.4–10.8)

## 2023-11-04 LAB — TESTOSTERONE, FREE, TOTAL, SHBG
Sex Hormone Binding: 58.6 nmol/L (ref 19.3–76.4)
Testosterone, Free: 20.6 pg/mL (ref 7.2–24.0)
Testosterone: 1068 ng/dL — ABNORMAL HIGH (ref 264–916)

## 2023-11-04 LAB — COMPREHENSIVE METABOLIC PANEL WITH GFR
ALT: 33 IU/L (ref 0–44)
AST: 39 IU/L (ref 0–40)
BUN/Creatinine Ratio: 11 (ref 9–20)
BUN: 12 mg/dL (ref 6–24)
Calcium: 9.5 mg/dL (ref 8.7–10.2)
Creatinine, Ser: 1.11 mg/dL (ref 0.76–1.27)
Globulin, Total: 2.8 g/dL (ref 1.5–4.5)
Glucose: 103 mg/dL — ABNORMAL HIGH (ref 70–99)
Potassium: 4.6 mmol/L (ref 3.5–5.2)
Sodium: 139 mmol/L (ref 134–144)

## 2023-11-04 LAB — PSA, TOTAL AND FREE
PSA, Free Pct: 27.1 %
PSA, Free: 0.19 ng/mL
Prostate Specific Ag, Serum: 0.7 ng/mL (ref 0.0–4.0)

## 2023-11-04 LAB — MICROALBUMIN / CREATININE URINE RATIO
Creatinine, Urine: 16 mg/dL
Microalb/Creat Ratio: 74 mg/g{creat} — ABNORMAL HIGH (ref 0–29)
Microalbumin, Urine: 11.8 ug/mL

## 2023-11-04 LAB — LIPID PANEL
Chol/HDL Ratio: 2.4 {ratio} (ref 0.0–5.0)
Cholesterol, Total: 97 mg/dL — ABNORMAL LOW (ref 100–199)
HDL: 41 mg/dL (ref >39)
LDL Chol Calc (NIH): 42 mg/dL (ref 0–99)
Triglycerides: 63 mg/dL (ref 0–149)
VLDL Cholesterol Cal: 14 mg/dL (ref 5–40)

## 2023-11-04 LAB — TSH: TSH: 2.64 u[IU]/mL (ref 0.450–4.500)

## 2023-11-05 MED ORDER — VALSARTAN 40 MG PO TABS: 40.0000 mg | ORAL_TABLET | Freq: Every day | ORAL | 3 refills | Status: DC

## 2023-11-05 NOTE — Addendum Note (Signed)
Addended by: Monica Becton on: 11/05/2023 04:44 PM   Modules accepted: Orders

## 2023-11-06 ENCOUNTER — Encounter: Payer: Self-pay | Admitting: Sports Medicine

## 2023-11-06 ENCOUNTER — Telehealth (HOSPITAL_COMMUNITY): Payer: Self-pay | Admitting: *Deleted

## 2023-11-06 ENCOUNTER — Telehealth: Payer: Self-pay

## 2023-11-06 ENCOUNTER — Ambulatory Visit (HOSPITAL_COMMUNITY): Payer: Managed Care, Other (non HMO) | Attending: Internal Medicine

## 2023-11-06 DIAGNOSIS — Z950 Presence of cardiac pacemaker: Secondary | ICD-10-CM | POA: Insufficient documentation

## 2023-11-06 DIAGNOSIS — I5022 Chronic systolic (congestive) heart failure: Secondary | ICD-10-CM | POA: Diagnosis present

## 2023-11-06 DIAGNOSIS — I493 Ventricular premature depolarization: Secondary | ICD-10-CM | POA: Diagnosis present

## 2023-11-06 DIAGNOSIS — I428 Other cardiomyopathies: Secondary | ICD-10-CM | POA: Diagnosis present

## 2023-11-06 DIAGNOSIS — R000 Tachycardia, unspecified: Secondary | ICD-10-CM | POA: Diagnosis present

## 2023-11-06 DIAGNOSIS — E1122 Type 2 diabetes mellitus with diabetic chronic kidney disease: Secondary | ICD-10-CM

## 2023-11-06 DIAGNOSIS — R002 Palpitations: Secondary | ICD-10-CM | POA: Diagnosis present

## 2023-11-06 DIAGNOSIS — I77810 Thoracic aortic ectasia: Secondary | ICD-10-CM | POA: Insufficient documentation

## 2023-11-06 LAB — ECHOCARDIOGRAM COMPLETE
Area-P 1/2: 3.63 cm2
P 1/2 time: 507 ms
S' Lateral: 5.4 cm

## 2023-11-06 MED ORDER — PERFLUTREN LIPID MICROSPHERE
1.0000 mL | INTRAVENOUS | Status: AC | PRN
  Administered 2023-11-06: 3 mL via INTRAVENOUS

## 2023-11-06 MED ORDER — VALSARTAN 40 MG PO TABS: 40.0000 mg | ORAL_TABLET | Freq: Every day | ORAL | 3 refills | Status: DC

## 2023-11-06 NOTE — Telephone Encounter (Signed)
[facility] valsartan 40mg  sent yesterday  Valsartan 40mg  refill sent to [facility] at patient request.   Spoke with patient and he will speak with wife to find out which medications he needs to be refilled now  [facility] also states that the prescriptions in his chart remaining could be requested to be transferred to them .

## 2023-11-06 NOTE — Telephone Encounter (Signed)
Received call from patient regarding upcoming cardiac imaging study; pt verbalizes understanding of appt date/time, parking situation and where to check in, pre-test NPO status and medications ordered, and verified current allergies; name and call back number provided for further questions should they arise Johney Frame RN Navigator Cardiac Imaging [facility] [phone] office [phone] cell  Patient verbalized understanding of diet prep.

## 2023-11-06 NOTE — Telephone Encounter (Signed)
Attempted to call patient regarding upcoming cardiac PET appointment. Left message on voicemail with name and callback number Johney Frame RN Navigator Cardiac Imaging [facility] [phone] Office

## 2023-11-06 NOTE — Telephone Encounter (Signed)
Copied from CRM [id number]. Topic: Clinical - Prescription Issue >> Nov 06, 2023 12:16 PM Fonda Kinder J wrote: Reason for CRM: Pts wife called in to update pt's pharmacy to [facility] so they are covered by insurance. She is asking to have the pts medication be resent to them as well as the new prescription he was prescribed on yesterday

## 2023-11-07 ENCOUNTER — Other Ambulatory Visit: Payer: Self-pay

## 2023-11-07 ENCOUNTER — Encounter (HOSPITAL_COMMUNITY)
Admission: RE | Admit: 2023-11-07 | Discharge: 2023-11-07 | Disposition: A | Discharge status: Home / Self Care | Payer: Managed Care, Other (non HMO) | Source: Ambulatory Visit | Attending: Physician Assistant | Admitting: Physician Assistant

## 2023-11-07 DIAGNOSIS — I5022 Chronic systolic (congestive) heart failure: Secondary | ICD-10-CM

## 2023-11-07 DIAGNOSIS — I428 Other cardiomyopathies: Secondary | ICD-10-CM | POA: Diagnosis present

## 2023-11-07 LAB — NM PET CT MYOCARDIAL SARCOIDOSIS
Nuc Stress EF: 31 %
Rest Nuclear Isotope Dose: 31.9 mCi

## 2023-11-07 MED ORDER — RUBIDIUM RB82 GENERATOR (RUBYFILL)
31.8600 | PACK | Freq: Once | INTRAVENOUS | Status: AC
  Administered 2023-11-07: 31.86 via INTRAVENOUS

## 2023-11-07 MED ORDER — FLUDEOXYGLUCOSE F - 18 (FDG) INJECTION
8.9200 | Freq: Once | INTRAVENOUS | Status: AC
  Administered 2023-11-07: 8.92 via INTRAVENOUS

## 2023-11-08 ENCOUNTER — Encounter: Payer: Self-pay | Admitting: Physician Assistant

## 2023-11-08 NOTE — Telephone Encounter (Signed)
Please see result note on scan

## 2023-11-20 ENCOUNTER — Ambulatory Visit: Payer: Managed Care, Other (non HMO) | Attending: Cardiovascular Disease

## 2023-11-20 DIAGNOSIS — I428 Other cardiomyopathies: Secondary | ICD-10-CM

## 2023-11-20 DIAGNOSIS — Z950 Presence of cardiac pacemaker: Secondary | ICD-10-CM

## 2023-11-20 LAB — CUP PACEART INCLINIC DEVICE CHECK
Battery Remaining Longevity: 66 mo
Brady Statistic RA Percent Paced: 3.8 %
Brady Statistic RV Percent Paced: 98 %
Date Time Interrogation Session: 20250226134507
HighPow Impedance: 83.25 Ohm
Implantable Lead Connection Status: 753985
Implantable Lead Connection Status: 753985
Implantable Lead Connection Status: 753985
Implantable Lead Implant Date: 20160407
Implantable Lead Implant Date: 20160407
Implantable Lead Implant Date: 20160407
Implantable Lead Location: 753858
Implantable Lead Location: 753859
Implantable Lead Location: 753860
Implantable Lead Model: 7122
Implantable Pulse Generator Implant Date: 20240325
Lead Channel Impedance Value: 425 Ohm
Lead Channel Impedance Value: 450 Ohm
Lead Channel Impedance Value: 600 Ohm
Lead Channel Pacing Threshold Amplitude: 0.5 V
Lead Channel Pacing Threshold Amplitude: 0.5 V
Lead Channel Pacing Threshold Amplitude: 0.75 V
Lead Channel Pacing Threshold Amplitude: 0.75 V
Lead Channel Pacing Threshold Amplitude: 1.5 V
Lead Channel Pacing Threshold Amplitude: 1.5 V
Lead Channel Pacing Threshold Pulse Width: 0.5 ms
Lead Channel Pacing Threshold Pulse Width: 0.5 ms
Lead Channel Pacing Threshold Pulse Width: 0.5 ms
Lead Channel Pacing Threshold Pulse Width: 0.5 ms
Lead Channel Pacing Threshold Pulse Width: 0.8 ms
Lead Channel Pacing Threshold Pulse Width: 0.8 ms
Lead Channel Sensing Intrinsic Amplitude: 3.4 mV
Lead Channel Sensing Intrinsic Amplitude: 5.3 mV
Lead Channel Setting Pacing Amplitude: 2 V
Lead Channel Setting Pacing Amplitude: 2 V
Lead Channel Setting Pacing Amplitude: 2.5 V
Lead Channel Setting Pacing Pulse Width: 0.5 ms
Lead Channel Setting Pacing Pulse Width: 0.8 ms
Lead Channel Setting Sensing Sensitivity: 0.5 mV
Pulse Gen Serial Number: 5566447
Zone Setting Status: 755011

## 2023-11-20 NOTE — Progress Notes (Signed)
 PMT observed on patient's ZIO monitor felt by A. Tillery, PA-C to be contributing to acute sx's of palpitations.  Chris, [employer] industry rep, present to evaluate device and re-program device to eliminate PMT sx. events.   Device interrogation normal today. Normal sensing, threshold and impedance readings stable per previous trends and in respect of safety margins.   Optivol with recent s/s of fluid retention - patient sees HF clinic on 12/03/23. Denies any current symptoms, knows to call if develops s/x's prior to HF appt.  Has lasix to use prn.   Has f/u with gen cards in next week to review med changes.   V-A conduction confirmed at .   PROGRAMMING CHANGES MADE TODAY:  1.  MAX TRACK RATE - from 130  to 125 bpm (automatically necessary due to lengthening of PVARP). 2.  PMT detection rate:  from 130 to 125npm 3.  PVARP - from to  (to cover up V-A conduction, eliminating pacemaker mediated tach).

## 2023-11-22 ENCOUNTER — Encounter (HOSPITAL_COMMUNITY): Payer: Managed Care, Other (non HMO) | Admitting: Cardiology

## 2023-12-03 ENCOUNTER — Other Ambulatory Visit (HOSPITAL_COMMUNITY): Payer: Self-pay

## 2023-12-03 ENCOUNTER — Encounter (HOSPITAL_COMMUNITY): Payer: Managed Care, Other (non HMO) | Admitting: Cardiology

## 2023-12-03 ENCOUNTER — Telehealth: Payer: Self-pay | Admitting: Physician Assistant

## 2023-12-03 ENCOUNTER — Ambulatory Visit (HOSPITAL_COMMUNITY)
Admission: RE | Admit: 2023-12-03 | Discharge: 2023-12-03 | Disposition: A | Discharge status: Home / Self Care | Source: Ambulatory Visit | Attending: Cardiology | Admitting: Cardiology

## 2023-12-03 VITALS — BP 138/84 | HR 80 | Ht 70.0 in | Wt 264.2 lb

## 2023-12-03 DIAGNOSIS — I5022 Chronic systolic (congestive) heart failure: Secondary | ICD-10-CM | POA: Diagnosis present

## 2023-12-03 DIAGNOSIS — I447 Left bundle-branch block, unspecified: Secondary | ICD-10-CM | POA: Diagnosis not present

## 2023-12-03 DIAGNOSIS — Z950 Presence of cardiac pacemaker: Secondary | ICD-10-CM | POA: Diagnosis not present

## 2023-12-03 DIAGNOSIS — E1122 Type 2 diabetes mellitus with diabetic chronic kidney disease: Secondary | ICD-10-CM | POA: Diagnosis not present

## 2023-12-03 LAB — BASIC METABOLIC PANEL
Anion gap: 11 (ref 5–15)
BUN: 10 mg/dL (ref 6–20)
CO2: 26 mmol/L (ref 22–32)
Calcium: 8.8 mg/dL — ABNORMAL LOW (ref 8.9–10.3)
Chloride: 100 mmol/L (ref 98–111)
Creatinine, Ser: 1.15 mg/dL (ref 0.61–1.24)
GFR, Estimated: >60 mL/min (ref >60)
Glucose, Bld: 115 mg/dL — ABNORMAL HIGH (ref 70–99)
Potassium: 3.9 mmol/L (ref 3.5–5.1)
Sodium: 137 mmol/L (ref 135–145)

## 2023-12-03 LAB — BRAIN NATRIURETIC PEPTIDE: B Natriuretic Peptide: 81 pg/mL (ref 0.0–100.0)

## 2023-12-03 MED ORDER — ENTRESTO 24-26 MG PO TABS: 1.0000 | ORAL_TABLET | Freq: Two times a day (BID) | ORAL | 0 refills | Status: DC

## 2023-12-03 MED ORDER — ENTRESTO 24-26 MG PO TABS: 1.0000 | ORAL_TABLET | Freq: Two times a day (BID) | ORAL | 6 refills | Status: DC

## 2023-12-03 NOTE — Progress Notes (Signed)
   ADVANCED HEART FAILURE CLINIC NOTE  Referring Physician: Monica Becton,*  Primary Care: Monica Becton, MD Primary Cardiologist:  CC: Heart failure with reduced EF  HPI: Andre [name]. is a 58 y.o. male with nonischemic cardiomyopathy, heart failure with reduced ejection fraction, hypertension, history of frequent PVCs, type 2 diabetes, peripheral neuropathy, history of alcohol abuse presenting today to establish care.  Based on chart review, he was diagnosed with heart failure in 2004.  At that time he was admitted to [facility] where he also underwent heart catheterization that was negative for CAD.  Based on chart review, he was also drinking heavily around that time.  Echocardiogram demonstrated EF of 20 to 25%.  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  In 2016 he underwent CRT-D placement by Dr. Ladona Ridgel with repeat an echo in 2018 demonstrating improvement of EF to 40 to 45%.  Since January 2024 he has had a slow decline.  He started developing palpitations at rest. He has also required a reduction in GDMT with drop in coreg and spironolactone doses. He underwent cardiac PET on 11/07/23 that was negative for sarcoid; LVEF of 30%.   From a functional standpoint, he has been doing fairly well. He performs all ADL independently. He does not struggle with PND or hypervolemia frequently.   Activity level/exercise tolerance:  NYHA IIB-III Orthopnea:  Sleeps on 2 pillows Paroxysmal noctural dyspnea:  no Chest pain/pressure:  no Orthostatic lightheadedness:  no Palpitations:  no Lower extremity edema:  no Presyncope/syncope:  no Cough:  no  Pertinent Family & social hx:    PHYSICAL EXAM: Vitals:   12/03/23 1338  BP: 138/84  Pulse: 80  SpO2: 97%   GENERAL: NAD Lungs- CTA CARDIAC:  JVP: 7 cm          Normal rate with regular rhythm. NO murmur.  Pulses 2+. No edema.  ABDOMEN: Soft,  non-tender, non-distended.  EXTREMITIES: Warm and well perfused.  NEUROLOGIC: No obvious FND   DATA REVIEW  ECG: AsVp rhythm  As per my personal interpretation  ECHO: 11/06/23: LVEF 30%, normal RV function.   Cardiac PET: 11/07/23: LVEF 30%, FDG uptake inconsistent with active inflammation/sarcoid.    ASSESSMENT & PLAN:  Heart failure with reduced EF Etiology of NW:GNFAOZHYQMV cardiomyopathy; LBBB, alcoholic cardiomyopathy? NYHA class / AHA Stage:NYHA IIB-III (at worst) Volume status & Diuretics: euvolemic Vasodilators:D/C Valsartan 40mg ; start Entresto 24/26mg  BID. CMP today and repeat in 1 week.  Beta-Blocker:Toprol 25mg  daily.  HQI:ONGEXBMWUXLKGM 25mg  daily Cardiometabolic:Synjardy 25-1000mg  q24h Devices therapies & Valvulopathies:CRT-D in place; device interrogation today with resolution of PMT Advanced therapies:Not currently indicated  PMT - Frequent tachycardia felt to be secondary to PMT; PVARP increased from to . Appears to have resolved now.  - Device interrogation today with no further episodes of PMT.   Obesity -Body mass index is 37.91 kg/m. -Discussed the importance of weight loss  T2DM - A1C of 7.2 on 2023; now down to 6.3 - Continue Synjardy as noted above; followed by his PCP.   I spent 40 minutes caring for this patient today including face to face time, ordering and reviewing labs, reviewing records from PCP, reviewing echocardiogram with patient, seeing the patient, documenting in the record, and arranging follow ups.   Jonathin Heinicke Advanced Heart Failure Mechanical Circulatory Support

## 2023-12-03 NOTE — Patient Instructions (Addendum)
 Stop Valsartan Start Entresto 24/26 mg twice daily - 30 day coupon provided with 10.00 coupon. 30 day Rx sent to local pharmacy per request. Labs today - will call you if abnormal. Repeat labs in 7 - 10 days. See below. Return to Heart Failure Pharmacy Clinic in 3 weeks - see below.  Return to see Dr. Gasper Lloyd in Heart Failure Clinic in two months. Please call [phone] late April to schedule this appointment.  Please call us at [phone] if any questions or concerns prior to your next appointment.

## 2023-12-03 NOTE — Telephone Encounter (Signed)
-----   Message from [place] sent at 11/20/2023 10:12 AM EST ----- Regarding: Pt needs appt asap w/ Lucile Crater Hello,   Patient is needing to see Lucile Crater asap, did not see anything open.

## 2023-12-03 NOTE — Telephone Encounter (Signed)
 LVM 3x to schedule f/u with Ronie Spies, PA

## 2023-12-13 ENCOUNTER — Ambulatory Visit (HOSPITAL_COMMUNITY)
Admission: RE | Admit: 2023-12-13 | Discharge: 2023-12-13 | Disposition: A | Discharge status: Home / Self Care | Source: Ambulatory Visit | Attending: Cardiology | Admitting: Cardiology

## 2023-12-13 DIAGNOSIS — I5022 Chronic systolic (congestive) heart failure: Secondary | ICD-10-CM | POA: Insufficient documentation

## 2023-12-13 LAB — BASIC METABOLIC PANEL
Anion gap: 9 (ref 5–15)
BUN: 14 mg/dL (ref 6–20)
CO2: 25 mmol/L (ref 22–32)
Calcium: 8.6 mg/dL — ABNORMAL LOW (ref 8.9–10.3)
Chloride: 101 mmol/L (ref 98–111)
Creatinine, Ser: 1.15 mg/dL (ref 0.61–1.24)
GFR, Estimated: >60 mL/min (ref >60)
Glucose, Bld: 172 mg/dL — ABNORMAL HIGH (ref 70–99)
Potassium: 4.5 mmol/L (ref 3.5–5.1)
Sodium: 135 mmol/L (ref 135–145)

## 2023-12-16 ENCOUNTER — Ambulatory Visit (INDEPENDENT_AMBULATORY_CARE_PROVIDER_SITE_OTHER): Payer: Managed Care, Other (non HMO)

## 2023-12-16 DIAGNOSIS — I5022 Chronic systolic (congestive) heart failure: Secondary | ICD-10-CM

## 2023-12-16 DIAGNOSIS — I428 Other cardiomyopathies: Secondary | ICD-10-CM

## 2023-12-17 ENCOUNTER — Encounter: Payer: Self-pay | Admitting: Internal Medicine

## 2023-12-17 LAB — CUP PACEART REMOTE DEVICE CHECK
Battery Remaining Longevity: 67 mo
Battery Remaining Percentage: 85 %
Battery Voltage: 3.02 V
Brady Statistic AP VP Percent: 3 %
Brady Statistic AP VS Percent: 1 %
Brady Statistic AS VP Percent: 92 %
Brady Statistic AS VS Percent: 2.3 %
Brady Statistic RA Percent Paced: 1 %
Date Time Interrogation Session: 20250324020032
HighPow Impedance: 86 Ohm
HighPow Impedance: 86 Ohm
Implantable Lead Connection Status: 753985
Implantable Lead Connection Status: 753985
Implantable Lead Connection Status: 753985
Implantable Lead Implant Date: 20160407
Implantable Lead Implant Date: 20160407
Implantable Lead Implant Date: 20160407
Implantable Lead Location: 753858
Implantable Lead Location: 753859
Implantable Lead Location: 753860
Implantable Lead Model: 7122
Implantable Pulse Generator Implant Date: 20240325
Lead Channel Impedance Value: 450 Ohm
Lead Channel Impedance Value: 480 Ohm
Lead Channel Impedance Value: 560 Ohm
Lead Channel Pacing Threshold Amplitude: 0.5 V
Lead Channel Pacing Threshold Amplitude: 0.75 V
Lead Channel Pacing Threshold Amplitude: 1.5 V
Lead Channel Pacing Threshold Pulse Width: 0.5 ms
Lead Channel Pacing Threshold Pulse Width: 0.5 ms
Lead Channel Pacing Threshold Pulse Width: 0.8 ms
Lead Channel Sensing Intrinsic Amplitude: 2.4 mV
Lead Channel Sensing Intrinsic Amplitude: 9.8 mV
Lead Channel Setting Pacing Amplitude: 2 V
Lead Channel Setting Pacing Amplitude: 2 V
Lead Channel Setting Pacing Amplitude: 2.5 V
Lead Channel Setting Pacing Pulse Width: 0.5 ms
Lead Channel Setting Pacing Pulse Width: 0.8 ms
Lead Channel Setting Sensing Sensitivity: 0.5 mV
Pulse Gen Serial Number: 5566447
Zone Setting Status: 755011

## 2023-12-26 NOTE — Progress Notes (Signed)
 Advanced Heart Failure Clinic Note   Referring Physician: Monica Becton Primary Care: Monica Becton, MD Primary HF Cardiologist: Dorthula Nettles MD  HPI:  Andre [name]. is a 58 y.o. male with nonischemic cardiomyopathy, heart failure with reduced ejection fraction, hypertension, history of frequent PVCs, type 2 diabetes, peripheral neuropathy, history of alcohol abuse presenting today to establish care.   Based on chart review, he was diagnosed with heart failure in 2004.  At that time he was admitted to [facility] where he also underwent heart catheterization that was negative for CAD.  Based on chart review, he was also drinking heavily around that time.  Echocardiogram demonstrated EF of 20 to 25%.  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  In 2016 he underwent CRT-D placement by Dr. Ladona Ridgel with repeat an echo in 2018 demonstrating improvement of EF to 40 to 45%.  Since January 2024 he has had a slow decline. He started developing palpitations at rest. He has also required a reduction in GDMT with drop in coreg and spironolactone doses. He underwent cardiac PET on 11/07/23 that was negative for sarcoid; LVEF of 30% in February 2025.  Seen in AHF clinic on 12/04/23 to establish care. From a functional standpoint, he had been doing fairly well. He noted performing all ADLs independently. He did not struggle with PND or hypervolemia frequently. Reported sleeping on 2 pillows at night.   Today he returns to HF clinic for pharmacist medication titration. At last visit with MD, valsartan 40 mg was switched to Entresto 24/26 mg BID. Patient reports overall feeling about the same since last visit. States that after last visit he started taking Entresto 24/26 mg once daily and was very fatigued for 2-3 days, but then symptoms resolved. After 1 week he increased Entresto to 24/26 mg - 2 tablets once daily.  Generally with his work schedule says it has been hard to take medications more than once daily. He works 12 hour night shifts for several days at a time then has several days off so his sleep schedule and wake time vary. Generally says he is consistently awake around 3 PM so this is the time he tries to take his medications. Has been taking metoprolol succinate 25 mg - 2 tablets once daily instead of 1 tablet BID. His wife is a Pharmacologist and helps with filling a pill box for him. Since last visit he denies lightheadedness, dizziness, fatigue, chest pain, and palpitations. Reports his breathing is good, denies SOB and is able to complete all activities he wants to do with no issues. He does not monitor his weight at home, but does have a scale. Says he will occasionally have mild LEE after standing on his feet all day at work, but generally denies any issues with swelling. Denies PND/orthopnea. Sleeps directly on top of 1 pillow at night. Mostly adherent to low sodium diet. Patient's wife reports she does not add salt to food at home when cooking and he packs his lunch for work, but they do eat out sometimes.   HF Medications: Metoprolol succinate 25 mg BID - taking 50 mg (2 tablets) once daily in AM Entresto 24/26 mg BID - taking 2 tablets once daily Spironolactone 12.5 mg daily Synjardy 25-1000 mg daily   Has the patient been experiencing any side effects to the medications prescribed?  No  Does the patient have any problems  obtaining medications due to transportation or finances?   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 Patient understands to avoid decongestants.    Pertinent Lab Values: Serum creatinine 1.15, BUN 14, Potassium 4.5, Sodium 135, BNP 81  Vital Signs: Weight: 266.6 lbs (last clinic weight: 263 lbs) Blood pressure: 152/98 Heart rate: 60    Assessment/Plan: Heart failure with reduced EF Etiology of WU:JWJXBJYNWGN cardiomyopathy; LBBB, alcoholic cardiomyopathy? NYHA class / AHA Stage:NYHA IIB-III (at worst) Volume status & Diuretics: euvolemic Beta-Blocker: Consolidate metoprolol succinate to 50 mg daily as this is how he has been taking it at home. Updated RX for 50 mg tablets ordered today to reduce pill burden. Vasodilators: Increase Entresto to 49/51 mg BID. Patient has been taking 2 of the 24/26 mg tablets once daily and BP is elevated today. Cr and K after starting Entresto are stable. Encouraged adherence to twice daily dosing. Discussed plan with his work schedule to ensure he is able to take doses ~12 hours a part. Plan to take  at 3 PM and 3-5 AM. On the night shift at work he has a break at 3 AM and when he is off work he normally wakes up at 5 AM. MRA: Increase spironolactone to 25 mg daily. Cardiometabolic: Continue Synjardy 25-1000 mg daily Devices therapies & Valvulopathies:CRT-D in place; device interrogation last visit with resolution of PMT Advanced therapies:Not currently indicated - Will discuss plan for repeat BMET at telephone visit next week.   PMT - Frequent tachycardia felt to be secondary to PMT; PVARP increased from to . Appears to have resolved now.  - Device interrogation today with no further episodes of PMT.    Obesity -Body mass index is 37.91 kg/m. Reports he has lost 50 lbs with Rybelsus.   T2DM - A1C of 7.2 on 2023; now down to 6.3 - Continue Synjardy as noted above; followed by his PCP.   Follow up Pharmacy clinic telephone visit 01/09/24 and MD 03/03/2024  Jarrett Ables, PharmD PGY-1 Pharmacy Resident   Karle Plumber, PharmD, BCPS, BCCP, CPP Heart Failure Clinic Pharmacist [phone]

## 2023-12-30 ENCOUNTER — Other Ambulatory Visit: Payer: Self-pay | Admitting: Sports Medicine

## 2023-12-30 DIAGNOSIS — E119 Type 2 diabetes mellitus without complications: Secondary | ICD-10-CM

## 2023-12-31 NOTE — Progress Notes (Incomplete)
 ***In Progress***    Advanced Heart Failure Clinic Note   Referring Physician: Monica Becton Primary Care: Monica Becton, MD Primary HF Cardiologist: Dorthula Nettles MD  HPI:  Andre [name]. is a 58 y.o. male with nonischemic cardiomyopathy, heart failure with reduced ejection fraction, hypertension, history of frequent PVCs, type 2 diabetes, peripheral neuropathy, history of alcohol abuse presenting today to establish care.   Based on chart review, he was diagnosed with heart failure in 2004.  At that time he was admitted to [facility] where he also underwent heart catheterization that was negative for CAD.  Based on chart review, he was also drinking heavily around that time.  Echocardiogram demonstrated EF of 20 to 25%.  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  In 2016 he underwent CRT-D placement by Dr. Ladona Ridgel with repeat an echo in 2018 demonstrating improvement of EF to 40 to 45%.  Since January 2024 he has had a slow decline.  He started developing palpitations at rest. He has also required a reduction in GDMT with drop in coreg and spironolactone doses. He underwent cardiac PET on 11/07/23 that was negative for sarcoid; LVEF of 30% in February 2025.  Seen in AHF clinic on 12/04/23 to establish care. From a functional standpoint, he had been doing fairly well. He noted performing all ADLs independently. He did not struggle with PND or hypervolemia frequently. Reported sleeping on 2 pillows at night.   Today he returns to HF clinic for pharmacist medication titration. At last visit with MD, valsartan 40 mg was switched to Entresto 24/26 mg BID.    Overall feeling ***. Dizziness, lightheadedness, fatigue:  Chest pain or palpitations:  How is your breathing?: *** SOB: Able to complete all ADLs. Activity level ***  Weight at home pounds. Takes furosemide/torsemide/bumex *** mg *** daily.   LEE PND/Orthopnea  Appetite *** Low-salt diet:   Physical Exam Cost/affordability of meds   Shortness of breath/dyspnea on exertion? {YES NO:22349}  Orthopnea/PND? {YES NO:22349} Edema? {YES NO:22349} Lightheadedness/dizziness? {YES NO:22349} Daily weights at home? {YES NO:22349} Blood pressure/heart rate monitoring at home? {YES [id number] Following low-sodium/fluid-restricted diet? {YES NO:22349}  HF Medications: Metoprolol succinate 25 mg BID Entresto 24/26 mg BID Spironolactone 25 mg daily Synjardy 25-1000 mg daily    Has the patient been experiencing any side effects to the medications prescribed?  {YES NO:22349}  Does the patient have any problems obtaining medications due to transportation or finances?   {YES NO:22349}  Understanding of regimen: {excellent/good/fair/poor:19665} Understanding of indications: {excellent/good/fair/poor:19665} Potential of compliance: {excellent/good/fair/poor:19665} Patient understands to avoid NSAIDs. Patient understands to avoid decongestants.    Pertinent Lab Values: Serum creatinine 1.15, BUN 14, Potassium 4.5, Sodium 135, BNP 81  Vital Signs: Weight: *** (last clinic weight: 263 lbs) Blood pressure: ***  Heart rate: ***   Assessment/Plan: Heart failure with reduced EF Etiology of EA:VWUJWJXBJYN cardiomyopathy; LBBB, alcoholic cardiomyopathy? NYHA class / AHA Stage:NYHA IIB-III (at worst) Volume status & Diuretics: euvolemic Vasodilators:D/C Valsartan 40mg ; start Entresto 24/26mg  BID. CMP today and repeat in 1 week.  Beta-Blocker:Toprol 25mg  daily.  WGN:FAOZHYQMVHQION 25mg  daily Cardiometabolic:Synjardy 25-1000mg  q24h Devices therapies & Valvulopathies:CRT-D in place; device interrogation today with resolution of PMT Advanced therapies:Not currently indicated  - Basic disease state pathophysiology, medication indication, mechanism and side effects reviewed at length with patient and he verbalized understanding    PMT - Frequent tachycardia felt to be secondary to PMT; PVARP  increased from to . Appears to have resolved now.  - Device interrogation today with no further episodes of PMT.    Obesity -Body mass index is 37.91 kg/m. -Discussed the importance of weight loss   T2DM - A1C of 7.2 on 2023; now down to 6.3 - Continue Synjardy as noted above; followed by his PCP.   Follow up ***  Jarrett Ables, PharmD PGY-1 Pharmacy Resident   Karle Plumber, PharmD, BCPS, BCCP, CPP Heart Failure Clinic Pharmacist [phone]

## 2024-01-01 ENCOUNTER — Ambulatory Visit (HOSPITAL_COMMUNITY)
Admission: RE | Admit: 2024-01-01 | Discharge: 2024-01-01 | Disposition: A | Discharge status: Home / Self Care | Source: Ambulatory Visit | Attending: Cardiology | Admitting: Cardiology

## 2024-01-01 DIAGNOSIS — I447 Left bundle-branch block, unspecified: Secondary | ICD-10-CM | POA: Insufficient documentation

## 2024-01-01 DIAGNOSIS — Z79899 Other long term (current) drug therapy: Secondary | ICD-10-CM | POA: Diagnosis not present

## 2024-01-01 DIAGNOSIS — Z95810 Presence of automatic (implantable) cardiac defibrillator: Secondary | ICD-10-CM | POA: Diagnosis not present

## 2024-01-01 DIAGNOSIS — I5022 Chronic systolic (congestive) heart failure: Secondary | ICD-10-CM

## 2024-01-01 DIAGNOSIS — E1142 Type 2 diabetes mellitus with diabetic polyneuropathy: Secondary | ICD-10-CM | POA: Insufficient documentation

## 2024-01-01 DIAGNOSIS — I110 Hypertensive heart disease with heart failure: Secondary | ICD-10-CM | POA: Insufficient documentation

## 2024-01-01 DIAGNOSIS — Z7984 Long term (current) use of oral hypoglycemic drugs: Secondary | ICD-10-CM | POA: Insufficient documentation

## 2024-01-01 DIAGNOSIS — Z4502 Encounter for adjustment and management of automatic implantable cardiac defibrillator: Secondary | ICD-10-CM | POA: Insufficient documentation

## 2024-01-01 DIAGNOSIS — I428 Other cardiomyopathies: Secondary | ICD-10-CM | POA: Insufficient documentation

## 2024-01-01 DIAGNOSIS — I5020 Unspecified systolic (congestive) heart failure: Secondary | ICD-10-CM | POA: Diagnosis present

## 2024-01-01 DIAGNOSIS — Z6837 Body mass index (BMI) 37.0-37.9, adult: Secondary | ICD-10-CM | POA: Insufficient documentation

## 2024-01-01 DIAGNOSIS — E669 Obesity, unspecified: Secondary | ICD-10-CM | POA: Insufficient documentation

## 2024-01-01 MED ORDER — SPIRONOLACTONE 25 MG PO TABS
25.0000 mg | ORAL_TABLET | Freq: Every day | ORAL | 3 refills | Status: AC
Start: 2024-01-01 — End: ?

## 2024-01-01 MED ORDER — METOPROLOL SUCCINATE ER 50 MG PO TB24: 50.0000 mg | ORAL_TABLET | Freq: Every day | ORAL | 3 refills | Status: DC

## 2024-01-01 MED ORDER — ENTRESTO 49-51 MG PO TABS
1.0000 | ORAL_TABLET | Freq: Two times a day (BID) | ORAL | 11 refills | Status: DC
Start: 2024-01-01 — End: 2024-01-29

## 2024-01-01 NOTE — Patient Instructions (Addendum)
 It was a pleasure seeing you today!  MEDICATIONS: -We are changing your medications today -Start taking Entresto 49/51 mg (1 tablet) twice daily. Try to take this as close to 12 hours a part as possible. -Increase spironolactone to 25 mg (1 tablet) daily. - We are changing your metoprolol succinate from 2 of the 25 mg tablets daily to 1 of the 50 mg tablets daily. -Call if you have questions about your medications.   NEXT APPOINTMENT: Pharmacy telephone visit next week on 01/09/2024 at 3:00 PM. Return to clinic with Dr. Gasper Lloyd on 03/03/2024 at 10:00 AM.  In general, to take care of your heart failure: -Limit your fluid intake to 2 Liters (half-gallon) per day.   -Limit your salt intake to ideally 2-3 grams (2000-3000 mg) per day. -Weigh yourself daily and record, and bring that "weight diary" to your next appointment.  (Weight gain of 2-3 pounds in 1 day typically means fluid weight.) -The medications for your heart are to help your heart and help you live longer.   -Please contact us before stopping any of your heart medications.  Call the clinic at [phone] with questions or to reschedule future appointments.

## 2024-01-08 NOTE — Progress Notes (Incomplete)
 ***In Progress***    Advanced Heart Failure Clinic Note   Referring Physician: Gean Keels Primary Care: Gean Keels, MD Primary HF Cardiologist: Alwin Baars MD   HPI:  Andre [name]. is a 58 y.o. male with nonischemic cardiomyopathy, heart failure with reduced ejection fraction, hypertension, history of frequent PVCs, type 2 diabetes, peripheral neuropathy, history of alcohol abuse presenting today to establish care.   Based on chart review, he was diagnosed with heart failure in 2004.  At that time he was admitted to [facility] where he also underwent heart catheterization that was negative for CAD.  Based on chart review, he was also drinking heavily around that time.  Echocardiogram demonstrated EF of 20 to 25%.  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  In 2016 he underwent CRT-D placement by Dr. Carolynne Citron with repeat an echo in 2018 demonstrating improvement of EF to 40 to 45%.  Since January 2024 he has had a slow decline. He started developing palpitations at rest. He has also required a reduction in GDMT with drop in coreg and spironolactone doses. He underwent cardiac PET on 11/07/23 that was negative for sarcoid; LVEF of 30% in February 2025.   Seen in AHF clinic on 12/04/23 to establish care. From a functional standpoint, he had been doing fairly well. He noted performing all ADLs independently. He did not struggle with PND or hypervolemia frequently. Reported sleeping on 2 pillows at night.    Today he returns to HF clinic for pharmacist medication titration. At last visit with MD, valsartan 40 mg was switched to Entresto 24/26 mg BID. Patient reports overall feeling about the same since last visit. States that after last visit he started taking Entresto 24/26 mg once daily and was very fatigued for 2-3 days, but then symptoms resolved. After 1 week he increased Entresto to 24/26 mg - 2  tablets once daily. Generally with his work schedule says it has been hard to take medications more than once daily. He works 12 hour night shifts for several days at a time then has several days off so his sleep schedule and wake time vary. Generally says he is consistently awake around 3 PM so this is the time he tries to take his medications. Has been taking metoprolol succinate 25 mg - 2 tablets once daily instead of 1 tablet BID. His wife is a Pharmacologist and helps with filling a pill box for him. Since last visit he denies lightheadedness, dizziness, fatigue, chest pain, and palpitations. Reports his breathing is good, denies SOB and is able to complete all activities he wants to do with no issues. He does not monitor his weight at home, but does have a scale. Says he will occasionally have mild LEE after standing on his feet all day at work, but generally denies any issues with swelling. Denies PND/orthopnea. Sleeps directly on top of 1 pillow at night. Mostly adherent to low sodium diet. Patient's wife reports she does not add salt to food at home when cooking and he packs his lunch for work, but they do eat out sometimes.  Today patient presented for follow up telephone visit for pharmacist medication titration. At last visit with pharmacist, patient reported difficulty with twice daily medications due to his work schedule and he was taking 2 tablets of Entresto 24/26 mg once daily. Also was taking metoprolol succinate 25 mg - 2 tablets once daily  instead of 1 tablet BID. BP was also elevated at 152/98. We discussed a plan to ensure he was able to take Entresto twice daily and increased the dose to 49/51 mg. Also consolidated metoprolol to 50 mg (1 tablet) once daily and increased spironolactone to 25 mg daily.   Overall feeling ***. Dizziness, lightheadedness, fatigue:  Chest pain or palpitations:  How is your breathing?: *** SOB: Able to complete all ADLs. Activity level ***  Weight at  home pounds. Takes furosemide/torsemide/bumex *** mg *** daily.  LEE PND/Orthopnea  Appetite *** Low-salt diet:   Physical Exam Cost/affordability of meds   Shortness of breath/dyspnea on exertion? {YES NO:22349}  Orthopnea/PND? {YES NO:22349} Edema? {YES NO:22349} Lightheadedness/dizziness? {YES NO:22349} Daily weights at home? {YES NO:22349} Blood pressure/heart rate monitoring at home? {YES [id number] Following low-sodium/fluid-restricted diet? {YES NO:22349}  HF Medications: Metoprolol succinate 50 mg daily Entresto 49/51 mg BID Spironolactone 25 mg daily Synjardy 25-1000 mg daily  Has the patient been experiencing any side effects to the medications prescribed?  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 Patient understands to avoid decongestants.    Pertinent Lab Values: Serum creatinine ***, BUN ***, Potassium ***, Sodium ***, BNP ***, Magnesium ***, Digoxin ***   Vital Signs: Weight: *** (last clinic weight: 266.6 lbs) Blood pressure: ***  Heart rate: ***   Assessment/Plan: Heart failure with reduced EF Etiology of ZO:XWRUEAVWUJW cardiomyopathy; LBBB, alcoholic cardiomyopathy? NYHA class / AHA Stage:NYHA IIB-III (at worst) Volume status & Diuretics: euvolemic Beta-Blocker: Consolidate metoprolol succinate to 50 mg daily as this is how he has been taking it at home. Updated RX for 50 mg tablets ordered today to reduce pill burden. Vasodilators: Increase Entresto to 49/51 mg BID. Patient has been taking 2 of the 24/26 mg tablets once daily and BP is elevated today. Cr and K after starting Entresto are stable. Encouraged adherence to twice daily  dosing. Discussed plan with his work schedule to ensure he is able to take doses ~12 hours a part. Plan to take  at 3 PM and 3-5 AM. On the night shift at work he has a break at 3 AM and when he is off work he normally wakes up at 5 AM. MRA: Increase spironolactone to 25 mg daily. Cardiometabolic: Continue Synjardy 25-1000 mg daily Devices therapies & Valvulopathies:CRT-D in place; device interrogation last visit with resolution of PMT Advanced therapies:Not currently indicated - Will discuss plan for repeat BMET at telephone visit next week.   PMT - Frequent tachycardia felt to be secondary to PMT; PVARP increased from to . Appears to have resolved now.  - Device interrogation today with no further episodes of PMT.    Obesity -Body mass index is 37.91 kg/m. Reports he has lost 50 lbs with Rybelsus.   T2DM - A1C of 7.2 on 2023; now down to 6.3 - Continue Synjardy as noted above; followed by his PCP.   Follow up with MD on 03/03/24  Jarrett Ables, PharmD PGY-1 Pharmacy Resident  Karle Plumber, PharmD, BCPS, BCCP, CPP Heart Failure Clinic Pharmacist [phone]

## 2024-01-09 ENCOUNTER — Ambulatory Visit (HOSPITAL_COMMUNITY)
Admission: RE | Admit: 2024-01-09 | Discharge: 2024-01-09 | Disposition: A | Discharge status: Home / Self Care | Source: Ambulatory Visit | Attending: Cardiology | Admitting: Cardiology

## 2024-01-09 DIAGNOSIS — I447 Left bundle-branch block, unspecified: Secondary | ICD-10-CM | POA: Diagnosis not present

## 2024-01-09 DIAGNOSIS — Z95810 Presence of automatic (implantable) cardiac defibrillator: Secondary | ICD-10-CM | POA: Diagnosis not present

## 2024-01-09 DIAGNOSIS — Z79899 Other long term (current) drug therapy: Secondary | ICD-10-CM | POA: Diagnosis not present

## 2024-01-09 DIAGNOSIS — I428 Other cardiomyopathies: Secondary | ICD-10-CM | POA: Diagnosis not present

## 2024-01-09 DIAGNOSIS — I5020 Unspecified systolic (congestive) heart failure: Secondary | ICD-10-CM | POA: Diagnosis present

## 2024-01-09 DIAGNOSIS — I110 Hypertensive heart disease with heart failure: Secondary | ICD-10-CM | POA: Insufficient documentation

## 2024-01-09 DIAGNOSIS — E1142 Type 2 diabetes mellitus with diabetic polyneuropathy: Secondary | ICD-10-CM | POA: Diagnosis not present

## 2024-01-09 DIAGNOSIS — I5022 Chronic systolic (congestive) heart failure: Secondary | ICD-10-CM

## 2024-01-09 DIAGNOSIS — Z6837 Body mass index (BMI) 37.0-37.9, adult: Secondary | ICD-10-CM | POA: Insufficient documentation

## 2024-01-09 DIAGNOSIS — E669 Obesity, unspecified: Secondary | ICD-10-CM | POA: Diagnosis not present

## 2024-01-09 NOTE — Progress Notes (Signed)
 Advanced Heart Failure Clinic Note   Referring Physician: Monica Becton Primary Care: Monica Becton, MD Primary HF Cardiologist: Dorthula Nettles MD   HPI:  Andre [name]. is a 58 y.o. male with nonischemic cardiomyopathy, heart failure with reduced ejection fraction, hypertension, history of frequent PVCs, type 2 diabetes, peripheral neuropathy and history of alcohol abuse.   Based on chart review, he was diagnosed with heart failure in 2004.  At that time he was admitted to [facility] where he also underwent heart catheterization that was negative for CAD.  Based on chart review, he was also drinking heavily around that time.  Echocardiogram demonstrated EF of 20 to 25%.  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  In 2016 he underwent CRT-D placement by Dr. Ladona Ridgel with repeat an echo in 2018 demonstrating improvement of EF to 40 to 45%.  Since January 2024 he has had a slow decline. He started developing palpitations at rest. He has also required a reduction in GDMT with drop in coreg and spironolactone doses. He underwent cardiac PET on 11/07/23 that was negative for sarcoid; LVEF of 30% in February 2025.   Seen in AHF clinic on 12/03/23 to establish care. From a functional standpoint, he had been doing fairly well. He noted performing all ADLs independently. He did not struggle with PND or hypervolemia frequently. Reported sleeping on 2 pillows at night. Valsartan was switched to Entresto 24/26 mg BID.    Patient presented for follow up in pharmacy clinic on 01/01/24. Reported overall feeling about the same since previous visit. Stated he was taking Entresto 24/26 mg - 2 tablets once daily. Generally with his work schedule said it had been hard to take medications more than once daily. He works 12 hour night shifts for several days at a time then has several days off so his sleep schedule and wake time  vary. Reported he is consistently awake around 3 PM so this is the time he was taking his medications. Had been taking metoprolol succinate 25 mg - 2 tablets once daily instead of 1 tablet BID. His wife is a Pharmacologist and had been helping with filling a pill box for him. Denied lightheadedness, dizziness, fatigue, chest pain, and palpitations. Reported his breathing was good, denied SOB and was able to complete all activities he wanted to do with no issues. He was not monitoring his weight at home, but said he did have a scale. Reported he will occasionally have mild LEE after standing on his feet all day at work, but generally denied any issues with swelling. Denied PND/orthopnea. He was sleeping directly on 1 pillow at night. Mostly adherent to low sodium diet. Patient's wife reported she does not add salt to food at home when cooking and he packs his lunch for work, but they do eat out sometimes.  Today successfully outreached patient for follow up telephone visit for pharmacist medication titration. At last visit with pharmacist, patient reported difficulty with twice daily medications due to his work schedule and he was taking 2 tablets of Entresto 24/26 mg once daily. Also was taking metoprolol succinate 25 mg - 2 tablets once daily instead of 1 tablet BID. BP was also elevated at 152/98. We discussed a plan to ensure he was able to take Entresto twice daily and increased the dose to 49/51 mg. Also consolidated metoprolol to 50 mg (1 tablet) once daily  and increased spironolactone to 25 mg daily. Overall patient reports he has been feeling good since the medication changes last week. He has been able to consistently take Entresto BID (~4 AM and ~4 PM). He has noticed he is sleeping later since increasing Entresto, but wakes up feeling rested and denies fatigue during the day. Denies lightheadedness, dizziness, chest pain, palpitations, SOB, LEE, PND, orthopnea. He has started monitoring his weight  daily at home which has been ~266-267 lbs. He has only checked his BP once since last visit which was elevated at 168/99. However, reports he did not rest prior to checking and was rushing to do this prior to our telephone call. HR on his watch has consistently been 70s-80.  HF Medications: Metoprolol succinate 50 mg daily Entresto 49/51 mg BID Spironolactone 25 mg daily Synjardy 25-1000 mg daily  Has the patient been experiencing any side effects to the medications prescribed?  No  Does the patient have any problems obtaining medications due to transportation or finances?  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 Patient understands to avoid decongestants.    Pertinent Lab Values: 12/13/23: Serum creatinine 1.15, BUN 14, Potassium 4.5, Sodium 135, BNP 81   Vital Signs: Last clinic weight: 266.6 lbs Unable to obtain updated weight, BP and HR due to telephone visit  Assessment/Plan: Heart failure with reduced EF Etiology of OZ:HYQMVHQIONG cardiomyopathy; LBBB, alcoholic cardiomyopathy? NYHA class / AHA Stage:NYHA IIB-III (at worst) Volume status & Diuretics: euvolemic at visit last week, weight at home this week has been stable Beta-Blocker: Continue metoprolol succinate 50 mg daily. Vasodilators: Continue Entresto 49/51 mg BID. He has been tolerating well and now adherent to BID dosing. Due for BMET at pharmacist follow up since increasing Entresto and spironolactone last week. Unable to assess BP control since medication changes last week as he has only checked once at home and did not rest prior to checking. If BP elevated and labs stable at follow up, ideally would further increase Entresto. MRA: Continue spironolactone 25 mg daily. Cardiometabolic: Continue Synjardy 25-1000 mg daily Devices therapies & Valvulopathies:CRT-D in place; device interrogation  last MD visit with resolution of PMT Advanced therapies:Not currently indicated   PMT - Frequent tachycardia felt to be secondary to PMT; PVARP increased from to . Appears to have resolved now.  - Device interrogation with no further episodes of PMT.    Obesity -Body mass index is 37.91 kg/m. Reports he has lost 50 lbs with Rybelsus.   T2DM - A1C of 7.2 on 2023; now down to 6.3 - Continue Synjardy as noted above; followed by his PCP.   Follow up with pharmacy clinic on 01/29/24 and MD on 03/03/24  Georga Killings, PharmD PGY-1 Pharmacy Resident  Luster Salters, PharmD, BCPS, BCCP, CPP Heart Failure Clinic Pharmacist [phone]

## 2024-01-27 NOTE — Progress Notes (Incomplete)
 ***In Progress***    Advanced Heart Failure Clinic Note   Referring Physician: Gean Keels Primary Care: Gean Keels, MD Primary HF Cardiologist: Alwin Baars MD   HPI:  Andre [name]. is a 58 y.o. male with NICM, HFrEF, HTN, history of frequent PVCs, type 2 diabetes, peripheral neuropathy and history of alcohol abuse.   Based on chart review, he was diagnosed with heart failure in 2004.  At that time he was admitted to [facility] where he also underwent LHC that was negative for CAD.  Based on chart review, he was also drinking heavily around that time.  Echo demonstrated EF of 20 to 25%.  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  In 2016 he underwent CRT-D placement by Dr. Carolynne Citron with repeat an echo in 2018 demonstrating improvement of EF to 40 to 45%.  Since January 2024 he has had a slow decline. He started developing palpitations at rest. He has also required a reduction in GDMT with drop in carvedilol  and spironolactone  doses. He underwent cardiac PET on 11/07/23 that was negative for sarcoid; LVEF of 30% in February 2025.   Seen in AHF clinic on 12/03/23 to establish care. From a functional standpoint, he had been doing fairly well. He noted performing all ADLs independently. He did not struggle with PND or hypervolemia frequently. Reported sleeping on 2 pillows at night. Valsartan  was switched to Entresto  24/26 mg BID.    Patient presented for follow up in pharmacy clinic on 01/01/24. Reported overall feeling about the same since previous visit. He reported occasionally having mild LEE but denied any issues with swelling. His BP was elevated 152/98 mmHg. Due working 12-hour shifts over night, the patient was taking Entresto  24/26 mg - 2 tablets once daily instead of 1 tablet BID and metoprolol  succinate 25 mg - 2 tablets once daily instead of 1 tablet BID. Patient was instructed to take his  medication and the beginning and end of each shift. His metoprolol  succinate was consolidated to 50 mg tablet once daily.   A follow-up telephone visit was conducted on 01/09/24, overall patient reported he has been feeling good since the previous medication changes. Denied lightheadedness, dizziness, SOB, LEE, PND, orthopnea. He had started monitoring his weight daily at home which has been ~266-267 lbs. He had only checked his BP once since last visit which was elevated at 168/99 mmHg.   Today he returns to HF clinic for pharmacist medication titration. At last visit with PharmD Entresto  was increased to 49/51 mg BID, metoprolol  succinate was consolidated to 50 mg daily, and spironolactone  was increased to 25 mg daily.***   Shortness of breath/dyspnea on exertion? {YES NO:22349}  Orthopnea/PND? {YES NO:22349} Edema? {YES NO:22349} Lightheadedness/dizziness? {YES NO:22349} Daily weights at home? {YES NO:22349} Blood pressure/heart rate monitoring at home? {YES [id number] Following low-sodium/fluid-restricted diet? {YES NO:22349}  HF Medications: Metoprolol  succinate 50 mg daily Entresto  49/51 mg BID Spironolactone  25 mg daily Synjardy  25-1000 mg daily  Has the patient been experiencing any side effects to the medications prescribed?  {YES NO:22349}  Does the patient have any problems obtaining medications due to transportation or finances?   ***; Insurance: [employer], commercial  Understanding of regimen: {excellent/good/fair/poor:19665} Understanding of indications: {excellent/good/fair/poor:19665} Potential of compliance: {excellent/good/fair/poor:19665} Patient understands to avoid NSAIDs. Patient understands to avoid decongestants.    Pertinent Lab Values: 12/13/23: Serum creatinine 1.15 mg/dL, BUN 14 mg/dL, Potassium 4.5 mmol/L, Sodium 135  mmol/L, BNP 81 pg/mL   Vital Signs: Weight: *** (last clinic weight: 266.6 lbs) Blood pressure: *** (last BP 152/98 mmHg) Heart rate: *** (last  HR 60 bpm)  Brief A/P: BMET today If BP still up, inc Entresto  97/103 BID If BP has improved, could inc metopS to 50 BID (?) at this visit, then Entresto  at f/u F/u Adi in 4 weeks   Assessment/Plan: Heart failure with reduced EF Etiology of HF: nonischemic cardiomyopathy; LBBB, alcoholic cardiomyopathy? NYHA class / AHA Stage:NYHA IIB-III (at worst)*** Volume status & Diuretics: euvolemic*** at visit last week, weight at home this week has been stable Beta-Blocker: ***Continue metoprolol  succinate 50 mg daily. Vasodilators: ***Continue Entresto  49/51 mg BID. He has been tolerating well and now adherent to BID dosing. ***Due for BMET at pharmacist*** follow up since increasing Entresto  and spironolactone  last*** week. Unable to assess BP control since medication changes last week as he has only checked once at home and did not rest prior to checking. If BP elevated and labs stable at follow up, ideally would further increase Entresto . MRA: Continue spironolactone  25 mg daily. Cardiometabolic: Continue Synjardy  25-1000 mg daily Devices therapies & Valvulopathies:CRT-D in place; device interrogation last MD visit with resolution of PMT Advanced therapies:Not currently indicated   PMT - Frequent tachycardia felt to be secondary to PMT; PVARP increased from to . Appears to have resolved now.    Obesity -Body mass index is 37.91 kg/m. Reports he has lost 50 lbs with Rybelsus .   T2DM - A1C of 7.2% on 2023; now down to 6.3% - Continue Synjardy  as noted above; followed by his PCP.    Follow up with *** on ***   Albino Alu, PharmD PGY2 Cardiology Pharmacy Resident

## 2024-01-29 ENCOUNTER — Ambulatory Visit (HOSPITAL_COMMUNITY)
Admission: RE | Admit: 2024-01-29 | Discharge: 2024-01-29 | Disposition: A | Discharge status: Home / Self Care | Source: Ambulatory Visit | Attending: Internal Medicine | Admitting: Internal Medicine

## 2024-01-29 VITALS — BP 138/78 | HR 70 | Wt 265.8 lb

## 2024-01-29 DIAGNOSIS — I5022 Chronic systolic (congestive) heart failure: Secondary | ICD-10-CM

## 2024-01-29 DIAGNOSIS — I447 Left bundle-branch block, unspecified: Secondary | ICD-10-CM | POA: Insufficient documentation

## 2024-01-29 DIAGNOSIS — I426 Alcoholic cardiomyopathy: Secondary | ICD-10-CM | POA: Diagnosis not present

## 2024-01-29 DIAGNOSIS — I110 Hypertensive heart disease with heart failure: Secondary | ICD-10-CM | POA: Diagnosis not present

## 2024-01-29 DIAGNOSIS — Z79899 Other long term (current) drug therapy: Secondary | ICD-10-CM | POA: Diagnosis not present

## 2024-01-29 DIAGNOSIS — E669 Obesity, unspecified: Secondary | ICD-10-CM | POA: Insufficient documentation

## 2024-01-29 DIAGNOSIS — E1142 Type 2 diabetes mellitus with diabetic polyneuropathy: Secondary | ICD-10-CM | POA: Insufficient documentation

## 2024-01-29 DIAGNOSIS — Z95810 Presence of automatic (implantable) cardiac defibrillator: Secondary | ICD-10-CM | POA: Diagnosis not present

## 2024-01-29 DIAGNOSIS — R000 Tachycardia, unspecified: Secondary | ICD-10-CM | POA: Insufficient documentation

## 2024-01-29 DIAGNOSIS — Z6837 Body mass index (BMI) 37.0-37.9, adult: Secondary | ICD-10-CM | POA: Diagnosis not present

## 2024-01-29 LAB — BASIC METABOLIC PANEL WITH GFR
Anion gap: 11 (ref 5–15)
BUN: 18 mg/dL (ref 6–20)
CO2: 26 mmol/L (ref 22–32)
Calcium: 9 mg/dL (ref 8.9–10.3)
Chloride: 99 mmol/L (ref 98–111)
Creatinine, Ser: 1.25 mg/dL — ABNORMAL HIGH (ref 0.61–1.24)
GFR, Estimated: >60 mL/min (ref >60)
Glucose, Bld: 134 mg/dL — ABNORMAL HIGH (ref 70–99)
Potassium: 4.2 mmol/L (ref 3.5–5.1)
Sodium: 136 mmol/L (ref 135–145)

## 2024-01-29 MED ORDER — ENTRESTO 97-103 MG PO TABS: 1.0000 | ORAL_TABLET | Freq: Two times a day (BID) | ORAL | 3 refills | Status: DC

## 2024-01-29 NOTE — Patient Instructions (Signed)
 It was a pleasure seeing you today!  MEDICATIONS: -We are changing your medications today -Increase Entresto  to 97/103 mg twice daily -Call if you have questions about your medications.  LABS: -We will call you if your labs need attention.  NEXT APPOINTMENT: Return to clinic in 4 weeks with Dr. Bruce Caper.  In general, to take care of your heart failure: -Limit your fluid intake to 2 Liters (half-gallon) per day.   -Limit your salt intake to ideally 2-3 grams (2000-3000 mg) per day. -Weigh yourself daily and record, and bring that "weight diary" to your next appointment.  (Weight gain of 2-3 pounds in 1 day typically means fluid weight.) -The medications for your heart are to help your heart and help you live longer.   -Please contact us  before stopping any of your heart medications.  Call the clinic at [phone] with questions or to reschedule future appointments.

## 2024-01-29 NOTE — Progress Notes (Signed)
 Advanced Heart Failure Clinic Note   Referring Physician: Gean Keels Primary Care: Gean Keels, MD Primary HF Cardiologist: Alwin Baars MD   HPI:  Andre [name]. is a 58 y.o. male with NICM, HFrEF, HTN, history of frequent PVCs, type 2 diabetes, peripheral neuropathy and history of alcohol abuse.   Based on chart review, he was diagnosed with heart failure in 2004.  At that time he was admitted to [facility] where he also underwent LHC that was negative for CAD.  Based on chart review, he was also drinking heavily around that time.  Echo demonstrated EF of 20 to 25%.  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  In 2016 he underwent CRT-D placement by Dr. Carolynne Citron with repeat an echo in 2018 demonstrating improvement of EF to 40 to 45%.  Since January 2024 he has had a slow decline. He started developing palpitations at rest. He has also required a reduction in GDMT with drop in carvedilol  and spironolactone  doses. He underwent cardiac PET on 11/07/23 that was negative for sarcoid; LVEF of 30% in February 2025.   Seen in AHF clinic on 12/03/23 to establish care. From a functional standpoint, he had been doing fairly well. He noted performing all ADLs independently. He did not struggle with PND or hypervolemia frequently. Reported sleeping on 2 pillows at night. Valsartan  was switched to Entresto  24/26 mg BID.    Patient presented for follow up in pharmacy clinic on 01/01/24. Reported overall feeling about the same since previous visit. He reported occasionally having mild LEE but denied any issues with swelling. His BP was elevated 152/98 mmHg. Due to working 12-hour shifts over night, the patient was taking Entresto  24/26 mg - 2 tablets once daily instead of 1 tablet BID and metoprolol  succinate 25 mg - 2 tablets once daily instead of 1 tablet BID. Patient was instructed to take his medication at the  beginning and end of each shift. At this visit, Entresto  was increased to 49/51 mg BID, metoprolol  succinate was consolidated to 50 mg daily, and spironolactone  was increased to 25 mg daily.     A follow-up telephone visit was conducted on 01/09/24, overall patient reported he had been feeling good since the previous medication changes. Denied lightheadedness, dizziness, SOB, LEE, PND, orthopnea. He had started monitoring his weight daily at home which has been ~266-267 lbs. He had only checked his BP once since last visit which was elevated at 168/99 mmHg. No changes were made.   Today he returns to HF clinic for pharmacist medication titration. He reports feeling well overall. States that he initially felt increased fatigue for a few days after adjusting his Entresto  but that subsided and he feels better. He has no complications with his breathing. No reports of LEE or swelling. Denies orthopnea, PND. He checks his weight at home daily and it is stable around 265-266 lbs. No reports of chest pain or palpitations. No complaints of lightheadedness or dizziness. He has been checking his BPs at home which are improved to 135-145/75-80 mmHg. He reports that he has been able to take his Entresto  at 0300 and 1500 every day and has not missed doses of any medications.  HF Medications: Metoprolol  succinate 50 mg daily Entresto  49/51 mg BID Spironolactone  25 mg daily Synjardy  25-1000 mg daily  Has the patient been experiencing any side effects to the medications prescribed?  Yes -  Initially, after adjusting Entresto  to BID, the patient had increased fatigue for a few days that subsided and is no longer bothersome  Does the patient have any problems obtaining medications due to transportation or finances?   No; Insurance: [employer], commercial  Understanding of regimen: good Understanding of indications: good Potential of compliance: excellent - with assistance from wife Patient understands to avoid  NSAIDs. Patient understands to avoid decongestants.    Pertinent Lab Values: 12/13/23: Serum creatinine 1.15 mg/dL, BUN 14 mg/dL, Potassium 4.5 mmol/L, Sodium 135 mmol/L, BNP 81 pg/mL   Vital Signs: Weight: 265.8 lbs (last clinic weight: 266.6 lbs) Blood pressure: 138/78 mmHg (last BP 152/98 mmHg) Heart rate: 70 bpm (last HR 60 bpm)   Assessment/Plan: Heart failure with reduced EF Etiology of HF: nonischemic cardiomyopathy; LBBB, alcoholic cardiomyopathy NYHA class / AHA Stage: NYHA II Volume status & Diuretics: euvolemic, weight at home has been stable Beta-Blocker: Continue metoprolol  succinate 50 mg daily. Vasodilators: Increase Entresto  to 97/103 mg BID. He has been tolerating well and now adherent to BID dosing. BP improved based on BP in clinic and readings at home around 140/80 mmHg. BMET today. MRA: Continue spironolactone  25 mg daily. Cardiometabolic: Continue Synjardy  25-1000 mg daily Devices therapies & Valvulopathies:CRT-D in place; device interrogation last MD visit with resolution of PMT Advanced therapies:Not currently indicated   PMT - Frequent tachycardia felt to be secondary to PMT; PVARP previously increased from to . Appears to have resolved now.    Obesity -Body mass index is 37.91 kg/m. Reports he has lost 50 lbs with Rybelsus .   T2DM - A1C of 7.2% in 2023; now down to 6.3% - Continue Synjardy  as noted above; followed by his PCP.     Follow up with MD in 4 weeks   Albino Alu, PharmD PGY2 Cardiology Pharmacy Resident   Luster Salters, PharmD, BCPS, [facility], CPP Heart Failure Clinic Pharmacist [phone]

## 2024-01-31 ENCOUNTER — Other Ambulatory Visit (HOSPITAL_COMMUNITY): Payer: Self-pay

## 2024-01-31 ENCOUNTER — Telehealth: Payer: Self-pay

## 2024-01-31 NOTE — Addendum Note (Signed)
 Addended by: Edra Govern D on: 01/31/2024 05:00 PM   Modules accepted: Orders

## 2024-01-31 NOTE — Telephone Encounter (Signed)
 Pharmacy Patient Advocate Encounter   Received notification from Pt Calls Messages that prior authorization for Rybelsus  14mg   is required/requested.   Insurance verification completed.   The patient is insured through [employer] .   Per test claim: PA required; PA submitted to above mentioned insurance via Phone Key/confirmation #/[id number] Status is pending   Phone# [phone]

## 2024-01-31 NOTE — Telephone Encounter (Signed)
 Copied from CRM [id number]. Topic: Clinical - Medication Prior Auth >> Jan 31, 2024  9:50 AM Retta Caster wrote: Reason for CRM: Semaglutide  (RYBELSUS ) 14 MG TABS  Hewitt Lou from [employer] requesting Prior Auth for medication. Needs call back  CB:[phone] Fax: [phone]

## 2024-01-31 NOTE — Progress Notes (Signed)
 Remote ICD transmission.

## 2024-02-03 ENCOUNTER — Other Ambulatory Visit (HOSPITAL_COMMUNITY): Payer: Self-pay

## 2024-02-03 NOTE — Telephone Encounter (Signed)
 error

## 2024-02-05 ENCOUNTER — Other Ambulatory Visit (HOSPITAL_COMMUNITY): Payer: Self-pay

## 2024-02-05 NOTE — Telephone Encounter (Signed)
 Pharmacy Patient Advocate Encounter  Received notification from [facility] that Prior Authorization for Rybelsus  14mg  has been APPROVED from 01/31/24 to 01/30/25   PA #/Case ID/Reference #: [id number]  Approval letter indexed to media tab

## 2024-02-09 ENCOUNTER — Other Ambulatory Visit: Payer: Self-pay | Admitting: Sports Medicine

## 2024-03-02 NOTE — Progress Notes (Signed)
   ADVANCED HEART FAILURE CLINIC NOTE  Referring Physician: Gean Keels,*  Primary Care: Gean Keels, MD Primary Cardiologist:  CC: Heart failure with reduced EF  HPI: Andre [name]. is a 58 y.o. male with nonischemic cardiomyopathy, heart failure with reduced ejection fraction, hypertension, history of frequent PVCs, type 2 diabetes, peripheral neuropathy, history of alcohol abuse presenting today to establish care.  Based on chart review, he was diagnosed with heart failure in 2004.  At that time he was admitted to [facility] where he also underwent heart catheterization that was negative for CAD.  Based on chart review, he was also drinking heavily around that time.  Echocardiogram demonstrated EF of 20 to 25%.  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  In 2016 he underwent CRT-D placement by Dr. Carolynne Citron with repeat an echo in 2018 demonstrating improvement of EF to 40 to 45%.  Since January 2024 he has had a slow decline.  He started developing palpitations at rest. He has also required a reduction in GDMT with drop in coreg  and spironolactone  doses. He underwent cardiac PET on 11/07/23 that was negative for sarcoid; LVEF of 30%.   From a functional standpoint, he has been doing fairly well. He performs all ADL independently. He does not struggle with PND or hypervolemia frequently.   Activity level/exercise tolerance:  NYHA IIB-III Orthopnea:  Sleeps on 2 pillows Paroxysmal noctural dyspnea:  no Chest pain/pressure:  no Orthostatic lightheadedness:  no Palpitations:  no Lower extremity edema:  no Presyncope/syncope:  no Cough:  no  Pertinent Family & social hx:    PHYSICAL EXAM: There were no vitals filed for this visit. GENERAL: NAD Lungs- *** CARDIAC:  JVP: *** cm          Normal rate with regular rhythm. *** murmur.  Pulses ***. *** edema.  ABDOMEN: Soft, non-tender,  non-distended.  EXTREMITIES: Warm and well perfused.  NEUROLOGIC: No obvious FND   DATA REVIEW  ECG: AsVp rhythm  As per my personal interpretation  ECHO: 11/06/23: LVEF 30%, normal RV function.   Cardiac PET: 11/07/23: LVEF 30%, FDG uptake inconsistent with active inflammation/sarcoid.    ASSESSMENT & PLAN:  Heart failure with reduced EF Etiology of ZO:[id number] cardiomyopathy; LBBB, alcoholic cardiomyopathy? NYHA class / AHA Stage:NYHA IIB-III (at worst) Volume status & Diuretics: euvolemic Vasodilators:D/C Valsartan  40mg ; start Entresto  24/26mg  BID. CMP today and repeat in 1 week.  Beta-Blocker:Toprol  25mg  daily.  MRA:spironolactone  25mg  daily Cardiometabolic:Synjardy  25-1000mg  q24h Devices therapies & Valvulopathies:CRT-D in place; device interrogation today with resolution of PMT Advanced therapies:Not currently indicated  PMT - Frequent tachycardia felt to be secondary to PMT; PVARP increased from to . Appears to have resolved now.  - Device interrogation today with no further episodes of PMT.   Obesity -There is no height or weight on file to calculate BMI. -Discussed the importance of weight loss  T2DM - A1C of 7.2 on 2023; now down to 6.3 - Continue Synjardy  as noted above; followed by his PCP.  I spent *** minutes caring for this patient today including face to face time, ordering and reviewing labs, reviewing records from ***, seeing the patient, documenting in the record, and arranging follow ups.    Andre [name] Advanced Heart Failure Mechanical Circulatory Support

## 2024-03-03 ENCOUNTER — Ambulatory Visit (HOSPITAL_COMMUNITY)
Admission: RE | Admit: 2024-03-03 | Discharge: 2024-03-03 | Disposition: A | Discharge status: Home / Self Care | Source: Ambulatory Visit | Attending: Cardiology | Admitting: Cardiology

## 2024-03-03 ENCOUNTER — Encounter (HOSPITAL_COMMUNITY): Payer: Self-pay | Admitting: Cardiology

## 2024-03-03 VITALS — BP 128/80 | HR 89 | Ht 70.0 in | Wt 265.8 lb

## 2024-03-03 DIAGNOSIS — E1140 Type 2 diabetes mellitus with diabetic neuropathy, unspecified: Secondary | ICD-10-CM | POA: Insufficient documentation

## 2024-03-03 DIAGNOSIS — I110 Hypertensive heart disease with heart failure: Secondary | ICD-10-CM | POA: Insufficient documentation

## 2024-03-03 DIAGNOSIS — I5022 Chronic systolic (congestive) heart failure: Secondary | ICD-10-CM | POA: Diagnosis not present

## 2024-03-03 DIAGNOSIS — Z79899 Other long term (current) drug therapy: Secondary | ICD-10-CM | POA: Diagnosis not present

## 2024-03-03 DIAGNOSIS — E669 Obesity, unspecified: Secondary | ICD-10-CM | POA: Insufficient documentation

## 2024-03-03 DIAGNOSIS — I447 Left bundle-branch block, unspecified: Secondary | ICD-10-CM

## 2024-03-03 DIAGNOSIS — Z95810 Presence of automatic (implantable) cardiac defibrillator: Secondary | ICD-10-CM | POA: Insufficient documentation

## 2024-03-03 DIAGNOSIS — I10 Essential (primary) hypertension: Secondary | ICD-10-CM

## 2024-03-03 DIAGNOSIS — I428 Other cardiomyopathies: Secondary | ICD-10-CM | POA: Diagnosis present

## 2024-03-03 DIAGNOSIS — I493 Ventricular premature depolarization: Secondary | ICD-10-CM | POA: Insufficient documentation

## 2024-03-03 DIAGNOSIS — F1011 Alcohol abuse, in remission: Secondary | ICD-10-CM | POA: Insufficient documentation

## 2024-03-03 DIAGNOSIS — Z6838 Body mass index (BMI) 38.0-38.9, adult: Secondary | ICD-10-CM | POA: Insufficient documentation

## 2024-03-03 LAB — BASIC METABOLIC PANEL WITH GFR
Anion gap: 10 (ref 5–15)
BUN: 12 mg/dL (ref 6–20)
CO2: 25 mmol/L (ref 22–32)
Calcium: 8.6 mg/dL — ABNORMAL LOW (ref 8.9–10.3)
Chloride: 100 mmol/L (ref 98–111)
Creatinine, Ser: 1.25 mg/dL — ABNORMAL HIGH (ref 0.61–1.24)
GFR, Estimated: >60 mL/min (ref >60)
Glucose, Bld: 159 mg/dL — ABNORMAL HIGH (ref 70–99)
Potassium: 3.9 mmol/L (ref 3.5–5.1)
Sodium: 135 mmol/L (ref 135–145)

## 2024-03-03 LAB — BRAIN NATRIURETIC PEPTIDE: B Natriuretic Peptide: 30.8 pg/mL (ref 0.0–100.0)

## 2024-03-03 MED ORDER — METOPROLOL SUCCINATE ER 100 MG PO TB24
100.0000 mg | ORAL_TABLET | Freq: Every day | ORAL | 5 refills | Status: AC
Start: 2024-03-03 — End: ?

## 2024-03-03 NOTE — Patient Instructions (Signed)
 Medication Changes:  INCREASE METOPROLOL  SUCCINATE TO 100MG  ONCE DAILY   Lab Work:  Labs done today, your results will be available in MyChart, we will contact you for abnormal readings.  Testing/Procedures:  ECHOCARDIOGRAM IN 1-2 MONTHS AS SCHEDULED   Follow-Up in: 3 MONTHS WITH DR. Bruce Caper PLEASE CALL OUR OFFICE AROUND JULY  TO GET SCHEDULED FOR YOUR APPOINTMENT. PHONE NUMBER IS [phone] OPTION 2   At the [facility], you and your health needs are our priority. We have a designated team specialized in the treatment of Heart Failure. This Care Team includes your primary Heart Failure Specialized Cardiologist (physician), Advanced Practice Providers (APPs- Physician Assistants and Nurse Practitioners), and Pharmacist who all work together to provide you with the care you need, when you need it.   You may see any of the following providers on your designated Care Team at your next follow up:  Dr. Jules Oar Dr. Peder Bourdon Dr. Alwin Baars Dr. Judyth Nunnery Nieves Bars, NP Ruddy Corral, [place] [facility] [place], [place] Dennise Fitz, NP [place] Lee, NP Luster Salters, PharmD   Please be sure to bring in all your medications bottles to every appointment.   Need to Contact Us :  If you have any questions or concerns before your next appointment please send us  a message through [place] or call our office at [phone].    TO LEAVE A MESSAGE FOR THE NURSE SELECT OPTION 2, PLEASE LEAVE A MESSAGE INCLUDING: YOUR NAME DATE OF BIRTH CALL BACK NUMBER REASON FOR CALL**this is important as we prioritize the call backs  YOU WILL RECEIVE A CALL BACK THE SAME DAY AS LONG AS YOU CALL BEFORE 4:00 PM

## 2024-03-16 ENCOUNTER — Ambulatory Visit (INDEPENDENT_AMBULATORY_CARE_PROVIDER_SITE_OTHER): Payer: Managed Care, Other (non HMO)

## 2024-03-16 DIAGNOSIS — I5022 Chronic systolic (congestive) heart failure: Secondary | ICD-10-CM | POA: Diagnosis not present

## 2024-03-16 DIAGNOSIS — I428 Other cardiomyopathies: Secondary | ICD-10-CM

## 2024-03-17 LAB — CUP PACEART REMOTE DEVICE CHECK
Battery Remaining Longevity: 65 mo
Battery Remaining Percentage: 81 %
Battery Voltage: 3.01 V
Brady Statistic AP VP Percent: 3.8 %
Brady Statistic AP VS Percent: 1 %
Brady Statistic AS VP Percent: 94 %
Brady Statistic AS VS Percent: 1.4 %
Brady Statistic RA Percent Paced: 2.9 %
Date Time Interrogation Session: 20250623085553
HighPow Impedance: 80 Ohm
HighPow Impedance: 80 Ohm
Implantable Lead Connection Status: 753985
Implantable Lead Connection Status: 753985
Implantable Lead Connection Status: 753985
Implantable Lead Implant Date: 20160407
Implantable Lead Implant Date: 20160407
Implantable Lead Implant Date: 20160407
Implantable Lead Location: 753858
Implantable Lead Location: 753859
Implantable Lead Location: 753860
Implantable Lead Model: 7122
Implantable Pulse Generator Implant Date: 20240325
Lead Channel Impedance Value: 430 Ohm
Lead Channel Impedance Value: 440 Ohm
Lead Channel Impedance Value: 660 Ohm
Lead Channel Pacing Threshold Amplitude: 0.5 V
Lead Channel Pacing Threshold Amplitude: 0.75 V
Lead Channel Pacing Threshold Amplitude: 1.5 V
Lead Channel Pacing Threshold Pulse Width: 0.5 ms
Lead Channel Pacing Threshold Pulse Width: 0.5 ms
Lead Channel Pacing Threshold Pulse Width: 0.8 ms
Lead Channel Sensing Intrinsic Amplitude: 12 mV
Lead Channel Sensing Intrinsic Amplitude: 4 mV
Lead Channel Setting Pacing Amplitude: 2 V
Lead Channel Setting Pacing Amplitude: 2 V
Lead Channel Setting Pacing Amplitude: 2.5 V
Lead Channel Setting Pacing Pulse Width: 0.5 ms
Lead Channel Setting Pacing Pulse Width: 0.8 ms
Lead Channel Setting Sensing Sensitivity: 0.5 mV
Pulse Gen Serial Number: 5566447
Zone Setting Status: 755011

## 2024-03-20 ENCOUNTER — Telehealth (HOSPITAL_COMMUNITY): Payer: Self-pay | Admitting: Cardiology

## 2024-03-22 ENCOUNTER — Ambulatory Visit: Payer: Self-pay | Admitting: Internal Medicine

## 2024-03-31 ENCOUNTER — Other Ambulatory Visit: Payer: Self-pay | Admitting: Sports Medicine

## 2024-03-31 ENCOUNTER — Ambulatory Visit: Admitting: Sports Medicine

## 2024-03-31 VITALS — BP 105/68 | HR 90 | Temp 98.3°F | Resp 20 | Ht 70.0 in | Wt 261.0 lb

## 2024-03-31 DIAGNOSIS — E119 Type 2 diabetes mellitus without complications: Secondary | ICD-10-CM

## 2024-03-31 DIAGNOSIS — L03032 Cellulitis of left toe: Secondary | ICD-10-CM | POA: Diagnosis not present

## 2024-03-31 DIAGNOSIS — E1122 Type 2 diabetes mellitus with diabetic chronic kidney disease: Secondary | ICD-10-CM | POA: Diagnosis not present

## 2024-03-31 DIAGNOSIS — R112 Nausea with vomiting, unspecified: Secondary | ICD-10-CM

## 2024-03-31 DIAGNOSIS — Z7984 Long term (current) use of oral hypoglycemic drugs: Secondary | ICD-10-CM

## 2024-03-31 DIAGNOSIS — M1A9XX0 Chronic gout, unspecified, without tophus (tophi): Secondary | ICD-10-CM

## 2024-03-31 DIAGNOSIS — R1110 Vomiting, unspecified: Secondary | ICD-10-CM | POA: Insufficient documentation

## 2024-03-31 MED ORDER — SUCRALFATE 1 G PO TABS: 1.0000 g | ORAL_TABLET | Freq: Four times a day (QID) | ORAL | 0 refills | Status: DC

## 2024-03-31 MED ORDER — METRONIDAZOLE 500 MG PO TABS: 500.0000 mg | ORAL_TABLET | Freq: Two times a day (BID) | ORAL | 0 refills | Status: AC

## 2024-03-31 MED ORDER — CIPROFLOXACIN HCL 500 MG PO TABS
500.0000 mg | ORAL_TABLET | Freq: Two times a day (BID) | ORAL | 0 refills | Status: AC
Start: 2024-03-31 — End: 2024-04-07

## 2024-03-31 MED ORDER — PANTOPRAZOLE SODIUM 40 MG PO TBEC: 40.0000 mg | DELAYED_RELEASE_TABLET | Freq: Every day | ORAL | 3 refills | Status: DC

## 2024-03-31 MED ORDER — DOXYCYCLINE HYCLATE 100 MG PO TABS
100.0000 mg | ORAL_TABLET | Freq: Two times a day (BID) | ORAL | 0 refills | Status: AC
Start: 2024-03-31 — End: 2024-04-07

## 2024-03-31 NOTE — Progress Notes (Addendum)
    Procedures performed today:    None.  Independent interpretation of notes and tests performed by another provider:   None.  Brief History, Exam, Impression, and Recommendations:    Vomiting This is a pleasant 58 year old male, multiple medical problems including congestive heart failure, diabetes, status post cholecystectomy he has had a couple of weeks of increasing vomiting, he does vomit reddish chunks. He has lost almost 10 pounds over the past couple weeks. He typically notices the vomiting after drinking 2-3 beers. He has not noticed any dark or tarry stools however he has been stooling more often. No fevers, chills, night sweats. Oropharyngeal exam is normal, abdominal exam normal. We will start somewhat of a shotgun workup for suspected gastritis/PUD/Mallory-Weiss tears. Adding pantoprazole  twice daily, Carafate , CBC, CMP, lipase, H. pylori, GI referral, he will avoid all alcohol consumption for now.   Paronychia of great toe, left Medial nail fold paronychia, diabetic, we will do triple coverage, doxycycline , ciprofloxacin , Flagyl . Patient knows with Flagyl  he really needs to be careful about not drinking.  Gout Uric acid levels are down to 2.9, we will decrease his allopurinol  to once daily.  I spent 40 minutes of total time managing this patient today, this includes chart review, face to face, and non-face to face time.  ____________________________________________ Debby PARAS. Curtis, M.D., ABFM., CAQSM., AME. Primary Care and Sports Medicine [facility]  Adjunct Professor of [facility] Medicine  [employer]  Restaurant Manager, [employer]

## 2024-03-31 NOTE — Assessment & Plan Note (Signed)
 This is a pleasant 58 year old male, multiple medical problems including congestive heart failure, diabetes, status post cholecystectomy he has had a couple of weeks of increasing vomiting, he does vomit reddish chunks. He has lost almost 10 pounds over the past couple weeks. He typically notices the vomiting after drinking 2-3 beers. He has not noticed any dark or tarry stools however he has been stooling more often. No fevers, chills, night sweats. Oropharyngeal exam is normal, abdominal exam normal. We will start somewhat of a shotgun workup for suspected gastritis/PUD/Mallory-Weiss tears. Adding pantoprazole  twice daily, Carafate , CBC, CMP, lipase, H. pylori, GI referral, he will avoid all alcohol consumption for now.

## 2024-03-31 NOTE — Assessment & Plan Note (Signed)
 Medial nail fold paronychia, diabetic, we will do triple coverage, doxycycline , ciprofloxacin , Flagyl . Patient knows with Flagyl  he really needs to be careful about not drinking.

## 2024-04-01 ENCOUNTER — Ambulatory Visit (INDEPENDENT_AMBULATORY_CARE_PROVIDER_SITE_OTHER): Payer: Self-pay | Admitting: Sports Medicine

## 2024-04-01 DIAGNOSIS — R112 Nausea with vomiting, unspecified: Secondary | ICD-10-CM

## 2024-04-01 NOTE — Addendum Note (Signed)
 Addended by: CURTIS DEBBY PARAS on: 04/01/2024 11:37 AM   Modules accepted: Orders

## 2024-04-01 NOTE — Assessment & Plan Note (Signed)
 Uric acid levels are down to 2.9, we will decrease his allopurinol  to once daily.

## 2024-04-02 LAB — COMPREHENSIVE METABOLIC PANEL WITH GFR
ALT: 28 IU/L (ref 0–44)
AST: 28 IU/L (ref 0–40)
Albumin: 4.4 g/dL (ref 3.8–4.9)
Alkaline Phosphatase: 64 IU/L (ref 44–121)
BUN/Creatinine Ratio: 12 (ref 9–20)
BUN: 12 mg/dL (ref 6–24)
Bilirubin Total: 0.3 mg/dL (ref 0.0–1.2)
CO2: 20 mmol/L (ref 20–29)
Calcium: 9.3 mg/dL (ref 8.7–10.2)
Chloride: 99 mmol/L (ref 96–106)
Creatinine, Ser: 1.01 mg/dL (ref 0.76–1.27)
Globulin, Total: 2.6 g/dL (ref 1.5–4.5)
Glucose: 175 mg/dL — ABNORMAL HIGH (ref 70–99)
Potassium: 4.4 mmol/L (ref 3.5–5.2)
Sodium: 136 mmol/L (ref 134–144)
Total Protein: 7 g/dL (ref 6.0–8.5)
eGFR: 88 mL/min/1.73 (ref >59)

## 2024-04-02 LAB — CBC WITH DIFFERENTIAL/PLATELET
Basophils Absolute: 0 x10E3/uL (ref 0.0–0.2)
Basos: 0 %
EOS (ABSOLUTE): 0.6 x10E3/uL — ABNORMAL HIGH (ref 0.0–0.4)
Eos: 6 %
Hematocrit: 51.2 % — ABNORMAL HIGH (ref 37.5–51.0)
Hemoglobin: 17.5 g/dL (ref 13.0–17.7)
Immature Grans (Abs): 0 x10E3/uL (ref 0.0–0.1)
Immature Granulocytes: 0 %
Lymphocytes Absolute: 1.6 x10E3/uL (ref 0.7–3.1)
Lymphs: 15 %
MCH: 31.9 pg (ref 26.6–33.0)
MCHC: 34.2 g/dL (ref 31.5–35.7)
MCV: 93 fL (ref 79–97)
Monocytes Absolute: 0.6 x10E3/uL (ref 0.1–0.9)
Monocytes: 6 %
Neutrophils Absolute: 7.6 x10E3/uL — ABNORMAL HIGH (ref 1.4–7.0)
Neutrophils: 73 %
Platelets: 146 x10E3/uL — ABNORMAL LOW (ref 150–450)
RBC: 5.48 x10E6/uL (ref 4.14–5.80)
RDW: 13.5 % (ref 11.6–15.4)
WBC: 10.4 x10E3/uL (ref 3.4–10.8)

## 2024-04-02 LAB — TSH: TSH: 2.04 u[IU]/mL (ref 0.450–4.500)

## 2024-04-02 LAB — H. PYLORI BREATH TEST: H pylori Breath Test: NEGATIVE

## 2024-04-02 LAB — LIPASE: Lipase: 49 U/L (ref 13–78)

## 2024-04-02 LAB — HEMOGLOBIN A1C
Est. average glucose Bld gHb Est-mCnc: 123 mg/dL
Hgb A1c MFr Bld: 5.9 % — ABNORMAL HIGH (ref 4.8–5.6)

## 2024-04-02 LAB — PROTIME-INR
INR: 1.1 (ref 0.9–1.2)
Prothrombin Time: 12.2 s — ABNORMAL HIGH (ref 9.1–12.0)

## 2024-04-02 LAB — URIC ACID: Uric Acid: 2.7 mg/dL — ABNORMAL LOW (ref 3.8–8.4)

## 2024-04-02 LAB — APTT: aPTT: 34 s — ABNORMAL HIGH (ref 24–33)

## 2024-04-03 ENCOUNTER — Ambulatory Visit (INDEPENDENT_AMBULATORY_CARE_PROVIDER_SITE_OTHER)

## 2024-04-03 DIAGNOSIS — L03032 Cellulitis of left toe: Secondary | ICD-10-CM | POA: Diagnosis not present

## 2024-04-03 DIAGNOSIS — R112 Nausea with vomiting, unspecified: Secondary | ICD-10-CM | POA: Diagnosis not present

## 2024-04-07 ENCOUNTER — Other Ambulatory Visit: Payer: Self-pay | Admitting: Sports Medicine

## 2024-04-07 DIAGNOSIS — E119 Type 2 diabetes mellitus without complications: Secondary | ICD-10-CM

## 2024-04-09 NOTE — Telephone Encounter (Signed)

## 2024-04-20 NOTE — Addendum Note (Signed)
 Addended by: TAWNI DRILLING D on: 04/20/2024 01:16 PM   Modules accepted: Orders

## 2024-04-20 NOTE — Progress Notes (Signed)
 Remote ICD transmission.

## 2024-04-23 ENCOUNTER — Ambulatory Visit: Payer: Managed Care, Other (non HMO) | Admitting: Sports Medicine

## 2024-05-04 ENCOUNTER — Telehealth: Payer: Self-pay | Admitting: Sports Medicine

## 2024-05-04 NOTE — Telephone Encounter (Unsigned)
 Copied from CRM [id number]. Topic: Clinical - Medication Refill >> May 04, 2024 12:57 PM Dominique A wrote: Medication: sacubitril -valsartan  (ENTRESTO ) 97-103 MG  Has the patient contacted their pharmacy? Yes Patients wife states that she has been fighting with [facility] to get the refill. Requesting a 30 day supply to go to [facility].   This is the patient's preferred pharmacy:   [id number] - [place], [place] - [address] AT [facility] OF [address] [place] [address] Phone: [phone] Fax: [phone]  Is this the correct pharmacy for this prescription? Yes If no, delete pharmacy and type the correct one.   Has the prescription been filled recently? No  Is the patient out of the medication? No. Patient has two days worth of medication left.   Has the patient been seen for an appointment in the last year OR does the patient have an upcoming appointment? Yes  Can we respond through MyChart? No  Agent: Please be advised that Rx refills may take up to 3 business days. We ask that you follow-up with your pharmacy.

## 2024-05-06 ENCOUNTER — Ambulatory Visit (HOSPITAL_COMMUNITY)
Admission: RE | Admit: 2024-05-06 | Discharge: 2024-05-06 | Disposition: A | Discharge status: Home / Self Care | Source: Ambulatory Visit | Attending: Sports Medicine | Admitting: Sports Medicine

## 2024-05-06 DIAGNOSIS — I080 Rheumatic disorders of both mitral and aortic valves: Secondary | ICD-10-CM | POA: Insufficient documentation

## 2024-05-06 DIAGNOSIS — I5022 Chronic systolic (congestive) heart failure: Secondary | ICD-10-CM | POA: Diagnosis present

## 2024-05-06 LAB — ECHOCARDIOGRAM COMPLETE
Calc EF: 43.6 %
Est EF: 25
S' Lateral: 6.2 cm
Single Plane A2C EF: 36.9 %
Single Plane A4C EF: 56.7 %

## 2024-05-06 MED ORDER — PERFLUTREN LIPID MICROSPHERE
1.0000 mL | INTRAVENOUS | Status: AC | PRN
  Administered 2024-05-06 (×2): 3 mL via INTRAVENOUS

## 2024-05-07 ENCOUNTER — Telehealth: Payer: Self-pay

## 2024-05-07 MED ORDER — SACUBITRIL-VALSARTAN 97-103 MG PO TABS
1.0000 | ORAL_TABLET | Freq: Two times a day (BID) | ORAL | 3 refills | Status: AC
Start: 2024-05-07 — End: ?

## 2024-05-07 NOTE — Telephone Encounter (Signed)
 Absolutely, Entresto  is well within our expertise. Sending now.  I happy to also send it to [facility], 3 months, if they desire.

## 2024-05-07 NOTE — Telephone Encounter (Signed)
 This message has been sent in separated telephone message to Dr. Curtis.

## 2024-05-07 NOTE — Telephone Encounter (Signed)
 Spoke with patient wife ( on HIPAA)   States they have not received the prescription of Entresto  form [facility] and was told that something with their insurance had changed.  Patient is out of Entresto . She was hoping that Dr. Curtis would phone in a 30 day supply of the medication  to a local pharmacy just until they could get the insurance issues resolved ? I asked if they had contacted Cardiology for this prescription refill and they have not .  I gave her cardiology's phone number to call to see if they could assist -  She is wanting to still see if Dr. Curtis would go ahead an refill for the patient. She is anxious that patient is out of medication and needing asap.

## 2024-05-07 NOTE — Telephone Encounter (Signed)
 Called patient wife and left a detailed voice mail message on listed contact # for wife Alan

## 2024-05-07 NOTE — Telephone Encounter (Signed)
 Copied from CRM [id number]. Topic: Clinical - Medication Refill >> May 04, 2024 12:57 PM Dominique A wrote: Medication: sacubitril -valsartan  (ENTRESTO ) 97-103 MG  Has the patient contacted their pharmacy? Yes Patients wife states that she has been fighting with [facility] to get the refill. Requesting a 30 day supply to go to [facility] [facility].   This is the patient's preferred pharmacy:   [id number] - [place], [place] - [address] AT [facility] OF [address] [place] [address] Phone: [phone] Fax: [phone]  Is this the correct pharmacy for this prescription? Yes If no, delete pharmacy and type the correct one.   Has the prescription been filled recently? No  Is the patient out of the medication? No. Patient has two days worth of medication left.   Has the patient been seen for an appointment in the last year OR does the patient have an upcoming appointment? Yes  Can we respond through MyChart? No  Agent: Please be advised that Rx refills may take up to 3 business days. We ask that you follow-up with your pharmacy. >> May 07, 2024  8:34 AM Miquel SAILOR wrote: Medication: sacubitril -valsartan  (ENTRESTO ) 97-103 MG-Patient wife calling on update for medication for Dr. IVAR Fortis office and transferred

## 2024-05-11 NOTE — Telephone Encounter (Signed)
 Attempted call to Alan ( patient's wife) - left a detailed voice mail message requesting a return call.

## 2024-05-13 NOTE — Telephone Encounter (Signed)
 Prescription has been sent to pharmacy with refills.

## 2024-05-26 ENCOUNTER — Encounter: Payer: Self-pay | Admitting: Sports Medicine

## 2024-05-28 ENCOUNTER — Other Ambulatory Visit: Payer: Self-pay

## 2024-05-29 ENCOUNTER — Other Ambulatory Visit: Payer: Self-pay

## 2024-05-29 DIAGNOSIS — E119 Type 2 diabetes mellitus without complications: Secondary | ICD-10-CM

## 2024-05-29 MED ORDER — RYBELSUS 14 MG PO TABS: 1.0000 | ORAL_TABLET | Freq: Every day | ORAL | 3 refills | Status: AC

## 2024-05-29 NOTE — Telephone Encounter (Signed)
 Pended prescription and pharmacy.

## 2024-05-29 NOTE — Telephone Encounter (Signed)
 Copied from CRM #[id number]. Topic: Clinical - Medication Question >> May 27, 2024 12:07 PM Mercer PEDLAR wrote: Reason for CRM: Alan, patient's wife calling to request medication refills and add new pharmacy to chart. She wanted to add [facility] pharmacy and provided phone number [phone] which is the same phone number that came up for all locations of [facility] mail delivery. Alan stated that she will callback when she finds the correct address.

## 2024-06-01 ENCOUNTER — Telehealth (HOSPITAL_COMMUNITY): Payer: Self-pay | Admitting: Cardiology

## 2024-06-15 ENCOUNTER — Ambulatory Visit (INDEPENDENT_AMBULATORY_CARE_PROVIDER_SITE_OTHER): Payer: Managed Care, Other (non HMO)

## 2024-06-15 DIAGNOSIS — I5022 Chronic systolic (congestive) heart failure: Secondary | ICD-10-CM | POA: Diagnosis not present

## 2024-06-15 LAB — CUP PACEART REMOTE DEVICE CHECK
Battery Remaining Longevity: 61 mo
Battery Remaining Percentage: 78 %
Battery Voltage: 2.99 V
Brady Statistic AP VP Percent: 3.8 %
Brady Statistic AP VS Percent: 1 %
Brady Statistic AS VP Percent: 94 %
Brady Statistic AS VS Percent: 1.7 %
Brady Statistic RA Percent Paced: 3 %
Date Time Interrogation Session: 20250922020017
HighPow Impedance: 84 Ohm
HighPow Impedance: 84 Ohm
Implantable Lead Connection Status: 753985
Implantable Lead Connection Status: 753985
Implantable Lead Connection Status: 753985
Implantable Lead Implant Date: 20160407
Implantable Lead Implant Date: 20160407
Implantable Lead Implant Date: 20160407
Implantable Lead Location: 753858
Implantable Lead Location: 753859
Implantable Lead Location: 753860
Implantable Lead Model: 7122
Implantable Pulse Generator Implant Date: 20240325
Lead Channel Impedance Value: 400 Ohm
Lead Channel Impedance Value: 440 Ohm
Lead Channel Impedance Value: 600 Ohm
Lead Channel Pacing Threshold Amplitude: 0.5 V
Lead Channel Pacing Threshold Amplitude: 0.75 V
Lead Channel Pacing Threshold Amplitude: 1.5 V
Lead Channel Pacing Threshold Pulse Width: 0.5 ms
Lead Channel Pacing Threshold Pulse Width: 0.5 ms
Lead Channel Pacing Threshold Pulse Width: 0.8 ms
Lead Channel Sensing Intrinsic Amplitude: 2.2 mV
Lead Channel Sensing Intrinsic Amplitude: 4.1 mV
Lead Channel Setting Pacing Amplitude: 2 V
Lead Channel Setting Pacing Amplitude: 2 V
Lead Channel Setting Pacing Amplitude: 2.5 V
Lead Channel Setting Pacing Pulse Width: 0.5 ms
Lead Channel Setting Pacing Pulse Width: 0.8 ms
Lead Channel Setting Sensing Sensitivity: 0.5 mV
Pulse Gen Serial Number: 5566447
Zone Setting Status: 755011

## 2024-06-16 NOTE — Progress Notes (Signed)
Remote ICD Transmission.

## 2024-06-18 ENCOUNTER — Other Ambulatory Visit: Payer: Self-pay

## 2024-06-20 ENCOUNTER — Ambulatory Visit: Payer: Self-pay | Admitting: Internal Medicine

## 2024-06-23 ENCOUNTER — Encounter (HOSPITAL_COMMUNITY): Admitting: Cardiology

## 2024-07-08 ENCOUNTER — Other Ambulatory Visit: Payer: Self-pay | Admitting: Medical-Surgical

## 2024-07-08 DIAGNOSIS — E119 Type 2 diabetes mellitus without complications: Secondary | ICD-10-CM

## 2024-07-08 MED ORDER — GLIPIZIDE ER 10 MG PO TB24: ORAL_TABLET | ORAL | 0 refills | Status: DC

## 2024-07-23 ENCOUNTER — Telehealth (HOSPITAL_COMMUNITY): Payer: Self-pay | Admitting: Cardiology

## 2024-07-23 NOTE — Telephone Encounter (Signed)
 Called to confirm/remind patient of their appointment at the [facility] on 07/23/24.   Appointment:   [] Confirmed  [x] Left mess   [] No answer/No voice mail  [] VM Full/unable to leave message  [] Phone not in service  Patient reminded to bring all medications and/or complete list.  Confirmed patient has transportation. Gave directions, instructed to utilize valet parking.

## 2024-07-24 ENCOUNTER — Ambulatory Visit (HOSPITAL_COMMUNITY)
Admission: RE | Admit: 2024-07-24 | Discharge: 2024-07-24 | Disposition: A | Discharge status: Home / Self Care | Source: Ambulatory Visit | Attending: Cardiology | Admitting: Cardiology

## 2024-07-24 VITALS — BP 128/80 | HR 79 | Wt 263.2 lb

## 2024-07-24 DIAGNOSIS — I5022 Chronic systolic (congestive) heart failure: Secondary | ICD-10-CM | POA: Diagnosis not present

## 2024-07-24 DIAGNOSIS — I447 Left bundle-branch block, unspecified: Secondary | ICD-10-CM | POA: Diagnosis not present

## 2024-07-24 DIAGNOSIS — I428 Other cardiomyopathies: Secondary | ICD-10-CM | POA: Diagnosis not present

## 2024-07-24 NOTE — Patient Instructions (Signed)
  Follow-Up in: 6 MONTHS WITH DR. ZENAIDA PLEASE CALL OUR OFFICE AROUND MARCH 2026 TO GET SCHEDULED FOR YOUR APPOINTMENT. PHONE NUMBER IS [phone] OPTION 2   At the [facility], you and your health needs are our priority. We have a designated team specialized in the treatment of Heart Failure. This Care Team includes your primary Heart Failure Specialized Cardiologist (physician), Advanced Practice Providers (APPs- Physician Assistants and Nurse Practitioners), and Pharmacist who all work together to provide you with the care you need, when you need it.   You may see any of the following providers on your designated Care Team at your next follow up:  Dr. Toribio Fuel Dr. Ezra Shuck Dr. Odis Zenaida Greig Mosses, NP Caffie Shed, [place] [facility] [place], [place] Beckey Coe, NP Jordan Lee, NP Tinnie Redman, PharmD   Please be sure to bring in all your medications bottles to every appointment.   Need to Contact Us :  If you have any questions or concerns before your next appointment please send us  a message through [place] or call our office at [phone].    TO LEAVE A MESSAGE FOR THE NURSE SELECT OPTION 2, PLEASE LEAVE A MESSAGE INCLUDING: YOUR NAME DATE OF BIRTH CALL BACK NUMBER REASON FOR CALL**this is important as we prioritize the call backs  YOU WILL RECEIVE A CALL BACK THE SAME DAY AS LONG AS YOU CALL BEFORE 4:00 PM

## 2024-07-24 NOTE — Progress Notes (Signed)
   ADVANCED HEART FAILURE FOLLOW UP CLINIC NOTE  Referring Physician: Curtis Debby PARAS,*  Primary Care: Curtis Debby PARAS, MD (Inactive) Primary Cardiologist:  HPI: Andre [name]. is a 58 y.o. male who presents for follow up of chronic systolic heart failure.      Based on chart review, he was diagnosed with heart failure in 2004. At that time he was admitted to [facility] where he also underwent heart catheterization that was negative for CAD. Based on chart review, he was also drinking heavily around that time. Echocardiogram demonstrated EF of 20 to 25%. 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 In 2016 he underwent CRT-D placement by Dr. Waddell with repeat an echo in 2018 demonstrating improvement of EF to 40 to 45%. Since January 2024 he has had a slow decline. He started developing palpitations at rest. He has also required a reduction in GDMT with drop in coreg  and spironolactone  doses. He underwent cardiac PET on 11/07/23 that was negative for sarcoid; LVEF of 30%.      SUBJECTIVE:  Patient overall doing well. He has been doing much better on medical therapy, denies any recent worsening of his exertional shortness of breath or orthopnea, and appears overall euvolemic.   PMH, current medications, allergies, social history, and family history reviewed in epic.  PHYSICAL EXAM: Vitals:   07/24/24 1144  BP: 128/80  Pulse: 79  SpO2: 97%   GENERAL: Well nourished and in no apparent distress at rest.  PULM:  Normal work of breathing, clear to auscultation bilaterally. Respirations are unlabored.  CARDIAC:  JVP: flat         Normal rate with regular rhythm. No murmurs, rubs or gallops.  No edema. Warm and well perfused extremities. ABDOMEN: Soft, non-tender, non-distended. NEUROLOGIC: Patient is oriented x3 with no focal or lateralizing neurologic deficits.    DATA REVIEW  ECG: AsVp rhythm   As per my personal interpretation   ECHO: 11/06/23: LVEF 30%, normal RV function.  04/2024: LVEF 25%, grade I DD, moderately dilated   Cardiac PET: 11/07/23: LVEF 30%, FDG uptake inconsistent with active inflammation/sarcoid.    ASSESSMENT & PLAN:  Chronic systolic heart failure: Overall appears well compensated, NYHA class II, euvolemic. Briefly discussed advanced therapies given LV remodeling and function, but given his limited symptoms suspect he is not close to needed advanced therapies. - Will obtain baseline CPET after discussion with patient - Continue entresto  97/103mg  BID - Continue metoprolol  succinate 100mg  daily - Continue spironolactone  25mg  daily - Previously on jardiance , but appears to have fallen off his list. Hopefully can restart - Overall doing well   PMT:  - Device settings adjusted previously, no further issues  Obesity:  - Improving, on semaglutide   T2DM:  - Significantly improved glucose control, A1c now 5.9. May need to come down on oral hypoglycemics if SGLT2 started  Follow up in 6 months  Morene Brownie, MD Advanced Heart Failure Mechanical Circulatory Support 07/24/24

## 2024-07-29 ENCOUNTER — Telehealth (HOSPITAL_COMMUNITY): Payer: Self-pay | Admitting: Cardiology

## 2024-07-29 NOTE — Telephone Encounter (Signed)
 Patients wife called upset-reports her husband was told that he has ~4 years to live and will need heart transplant. Wife states conversations with Dr Gardenia were very different, reports Dr Gardenia gave hope and the appt with Dr Zenaida was very different. She was not able to accompany him to 10/31 appt as her mother recently passed away  Wife would like to know what has changed?  Requests a call from Dr Zenaida and a appointment with new Dr.  GLENWOODadvised wife of providers schedule for the week, could not guarantee a returned call and explained potential delay.  -follow up made 12/18 with Bensimhon

## 2024-08-13 ENCOUNTER — Encounter: Payer: Self-pay | Admitting: Cardiology

## 2024-09-09 ENCOUNTER — Ambulatory Visit: Admitting: Family Medicine

## 2024-09-09 ENCOUNTER — Encounter: Payer: Self-pay | Admitting: Family Medicine

## 2024-09-09 VITALS — BP 125/74 | HR 69 | Ht 70.0 in | Wt 268.0 lb

## 2024-09-09 DIAGNOSIS — E119 Type 2 diabetes mellitus without complications: Secondary | ICD-10-CM | POA: Diagnosis not present

## 2024-09-09 DIAGNOSIS — E039 Hypothyroidism, unspecified: Secondary | ICD-10-CM | POA: Diagnosis not present

## 2024-09-09 DIAGNOSIS — Z7984 Long term (current) use of oral hypoglycemic drugs: Secondary | ICD-10-CM

## 2024-09-09 DIAGNOSIS — R112 Nausea with vomiting, unspecified: Secondary | ICD-10-CM

## 2024-09-09 DIAGNOSIS — I10 Essential (primary) hypertension: Secondary | ICD-10-CM | POA: Diagnosis not present

## 2024-09-09 DIAGNOSIS — E785 Hyperlipidemia, unspecified: Secondary | ICD-10-CM | POA: Insufficient documentation

## 2024-09-09 DIAGNOSIS — E1129 Type 2 diabetes mellitus with other diabetic kidney complication: Secondary | ICD-10-CM

## 2024-09-09 DIAGNOSIS — Z23 Encounter for immunization: Secondary | ICD-10-CM | POA: Diagnosis not present

## 2024-09-09 DIAGNOSIS — M1A9XX0 Chronic gout, unspecified, without tophus (tophi): Secondary | ICD-10-CM

## 2024-09-09 MED ORDER — PANTOPRAZOLE SODIUM 40 MG PO TBEC: 40.0000 mg | DELAYED_RELEASE_TABLET | Freq: Every day | ORAL | 3 refills | Status: AC

## 2024-09-09 MED ORDER — METOPROLOL SUCCINATE ER 100 MG PO TB24: 100.0000 mg | ORAL_TABLET | Freq: Every day | ORAL | 5 refills | Status: AC

## 2024-09-09 MED ORDER — SACUBITRIL-VALSARTAN 97-103 MG PO TABS: 1.0000 | ORAL_TABLET | Freq: Two times a day (BID) | ORAL | 3 refills | Status: AC

## 2024-09-09 MED ORDER — SPIRONOLACTONE 25 MG PO TABS: 25.0000 mg | ORAL_TABLET | Freq: Every day | ORAL | 3 refills | Status: AC

## 2024-09-09 MED ORDER — ALLOPURINOL 300 MG PO TABS: 300.0000 mg | ORAL_TABLET | Freq: Every day | ORAL | 3 refills | Status: AC

## 2024-09-09 MED ORDER — SYNJARDY XR 25-1000 MG PO TB24: 1.0000 | ORAL_TABLET | Freq: Every day | ORAL | 3 refills | Status: AC

## 2024-09-09 MED ORDER — LEVOTHYROXINE SODIUM 50 MCG PO TABS: 50.0000 ug | ORAL_TABLET | Freq: Every day | ORAL | 3 refills | Status: AC

## 2024-09-09 MED ORDER — GLIPIZIDE ER 10 MG PO TB24: ORAL_TABLET | ORAL | 3 refills | Status: AC

## 2024-09-09 MED ORDER — ROSUVASTATIN CALCIUM 5 MG PO TABS: 5.0000 mg | ORAL_TABLET | Freq: Every day | ORAL | 3 refills | Status: AC

## 2024-09-09 NOTE — Assessment & Plan Note (Signed)
 metoprolol  XL 100 mg daily, Entresto  97-103 mg BID, spironolactone  25 mg daily (Sees cards: pacemaker, HF, EF 20-25%) Denies chest pain or shortness of breath. Well controlled in the office today.  03/31/24: GFR 88, K+ 4.4 BMP today Follow-up in 6 months

## 2024-09-09 NOTE — Progress Notes (Signed)
 Established Patient Office Visit  Subjective   Patient ID: Andre [name]., male    DOB: [birth year]  Age: 58 y.o. MRN: [id number]  Chief Complaint  Patient presents with   Transitions Of Care    Pt is tolerating medications well would like refills - has been on a journey figuring out what works and this combination does     HPI  Gout: allopurinol  300 mg daily 03/31/24: uric acid: 2.7 Managing symptoms well.  No flare in years.   DM: glipizide  XL 10 mg daily, Synjardy  XR 25-1000 mg daily, Rybelsus  14 mg daily  03/31/24: A1C 5.9 Does not monitor at home. 03/31/24: A1C 5.9 Recheck today  Hypothyroid: levothyroxine  50 mcg daily Symptoms are well controlled.  Recheck thyroid  function today.   HTN: metoprolol  XL 100 mg daily, Entresto  97-103 mg BID, spironolactone  25 mg daily (Sees cards: pacemaker, HF, EF 20-25%) Denies chest pain or shortness of breath. Well controlled in the office today.  03/31/24: GFR 88, K+ 4.4 BMP today  ED: tadalafil  20 mg daily every 3 days.  Symptoms are controlled.  HLD: rosuvastatin  5 mg daily Tolerates well. Not fasting. Defer lipids today.    ROS    Objective:     BP 125/74 (BP Location: Left Arm, Patient Position: Sitting, Cuff Size: Normal)   Pulse 69   Ht 5' 10 (1.778 m)   Wt 268 lb (121.6 kg)   SpO2 98%   BMI 38.45 kg/m    Physical Exam Vitals and nursing note reviewed.  Constitutional:      General: He is not in acute distress.    Appearance: Normal appearance.  Cardiovascular:     Rate and Rhythm: Normal rate and regular rhythm.     Heart sounds: Normal heart sounds.  Pulmonary:     Effort: Pulmonary effort is normal.     Breath sounds: Normal breath sounds.  Skin:    General: Skin is warm and dry.  Neurological:     General: No focal deficit present.     Mental Status: He is alert. Mental status is at baseline.  Psychiatric:        Mood and Affect: Mood normal.        Behavior: Behavior normal.         Thought Content: Thought content normal.        Judgment: Judgment normal.      Results for orders placed or performed in visit on 09/09/24  Basic metabolic panel with GFR  Result Value Ref Range   Glucose 127 (H) 70 - 99 mg/dL   BUN 18 6 - 24 mg/dL   Creatinine, Ser 8.81 0.76 - 1.27 mg/dL   eGFR 72 >40 fO/fpw/8.26   BUN/Creatinine Ratio 15 9 - 20   Sodium 135 134 - 144 mmol/L   Potassium 4.4 3.5 - 5.2 mmol/L   Chloride 98 96 - 106 mmol/L   CO2 22 20 - 29 mmol/L   Calcium  9.7 8.7 - 10.2 mg/dL  Hemoglobin J8r  Result Value Ref Range   Hgb A1c MFr Bld 5.7 (H) 4.8 - 5.6 %   Est. average glucose Bld gHb Est-mCnc 117 mg/dL  TSH + free T4  Result Value Ref Range   TSH 4.990 (H) 0.450 - 4.500 uIU/mL   Free T4 1.34 0.82 - 1.77 ng/dL      The ASCVD Risk score (Arnett DK, et al., 2019) failed to calculate for the following reasons:   The valid total cholesterol  range is 130 to 320 mg/dL    Assessment & Plan:   Essential hypertension, benign Assessment & Plan: metoprolol  XL 100 mg daily, Entresto  97-103 mg BID, spironolactone  25 mg daily (Sees cards: pacemaker, HF, EF 20-25%) Denies chest pain or shortness of breath. Well controlled in the office today.  03/31/24: GFR 88, K+ 4.4 BMP today Follow-up in 6 months  Orders: -     Basic metabolic panel with GFR -     Spironolactone ; Take 1 tablet (25 mg total) by mouth daily.  Dispense: 90 tablet; Refill: 3 -     Sacubitril -Valsartan ; Take 1 tablet by mouth 2 (two) times daily.  Dispense: 60 tablet; Refill: 3 -     Metoprolol  Succinate ER; Take 1 tablet (100 mg total) by mouth daily.  Dispense: 30 tablet; Refill: 5  Type 2 diabetes mellitus, controlled, with renal complications (HCC) Assessment & Plan: glipizide  XL 10 mg daily, Synjardy  XR 25-1000 mg daily, Rybelsus  14 mg daily  03/31/24: A1C 5.9 Does not monitor at home. 03/31/24: A1C 5.9 Recheck today  Orders: -     Hemoglobin A1c  Hypothyroidism, unspecified  type Assessment & Plan: levothyroxine  50 mcg daily Symptoms are well controlled.  Recheck thyroid  function today.    Orders: -     TSH + free T4 -     Levothyroxine  Sodium; Take 1 tablet (50 mcg total) by mouth daily before breakfast.  Dispense: 90 tablet; Refill: 3  Hyperlipidemia, unspecified hyperlipidemia type Assessment & Plan: osuvastatin 5 mg daily Tolerates well. Not fasting. Defer lipids today.    Orders: -     Rosuvastatin  Calcium ; Take 1 tablet (5 mg total) by mouth daily.  Dispense: 90 tablet; Refill: 3  Controlled type 2 diabetes mellitus without complication, without long-term current use of insulin  (HCC) -     Synjardy  XR; Take 1 tablet by mouth daily.  Dispense: 90 tablet; Refill: 3 -     glipiZIDE  ER; TAKE 2 TABLETS DAILY WITH BREAKFAST  Dispense: 180 tablet; Refill: 3 -     Rosuvastatin  Calcium ; Take 1 tablet (5 mg total) by mouth daily.  Dispense: 90 tablet; Refill: 3  Chronic gout without tophus, unspecified cause, unspecified site Assessment & Plan: allopurinol  300 mg daily 03/31/24: uric acid: 2.7 Managing symptoms well.  No flare in years.   Orders: -     Allopurinol ; Take 1 tablet (300 mg total) by mouth daily.  Dispense: 90 tablet; Refill: 3  Nausea and vomiting, unspecified vomiting type -     Pantoprazole  Sodium; Take 1 tablet (40 mg total) by mouth daily.  Dispense: 30 tablet; Refill: 3  Encounter for administration of vaccine -     Flu vaccine trivalent PF, 6mos and older(Flulaval,Afluria,Fluarix,Fluzone)      Return in about 6 months (around 03/10/2025) for chronic conditions .    Darice JONELLE Brownie, FNP

## 2024-09-09 NOTE — Assessment & Plan Note (Signed)
 levothyroxine  50 mcg daily Symptoms are well controlled.  Recheck thyroid  function today.

## 2024-09-09 NOTE — Assessment & Plan Note (Signed)
 allopurinol  300 mg daily 03/31/24: uric acid: 2.7 Managing symptoms well.  No flare in years.

## 2024-09-09 NOTE — Assessment & Plan Note (Signed)
 glipizide  XL 10 mg daily, Synjardy  XR 25-1000 mg daily, Rybelsus  14 mg daily  03/31/24: A1C 5.9 Does not monitor at home. 03/31/24: A1C 5.9 Recheck today

## 2024-09-09 NOTE — Assessment & Plan Note (Signed)
 osuvastatin 5 mg daily Tolerates well. Not fasting. Defer lipids today.

## 2024-09-10 ENCOUNTER — Ambulatory Visit: Payer: Self-pay | Admitting: Family Medicine

## 2024-09-10 ENCOUNTER — Ambulatory Visit (HOSPITAL_COMMUNITY): Admitting: Internal Medicine

## 2024-09-10 DIAGNOSIS — R7989 Other specified abnormal findings of blood chemistry: Secondary | ICD-10-CM | POA: Insufficient documentation

## 2024-09-10 LAB — BASIC METABOLIC PANEL WITH GFR
BUN/Creatinine Ratio: 15 (ref 9–20)
BUN: 18 mg/dL (ref 6–24)
CO2: 22 mmol/L (ref 20–29)
Calcium: 9.7 mg/dL (ref 8.7–10.2)
Chloride: 98 mmol/L (ref 96–106)
Creatinine, Ser: 1.18 mg/dL (ref 0.76–1.27)
Glucose: 127 mg/dL — ABNORMAL HIGH (ref 70–99)
Potassium: 4.4 mmol/L (ref 3.5–5.2)
Sodium: 135 mmol/L (ref 134–144)
eGFR: 72 mL/min/1.73 (ref >59)

## 2024-09-10 LAB — HEMOGLOBIN A1C
Est. average glucose Bld gHb Est-mCnc: 117 mg/dL
Hgb A1c MFr Bld: 5.7 % — ABNORMAL HIGH (ref 4.8–5.6)

## 2024-09-10 LAB — TSH+FREE T4
Free T4: 1.34 ng/dL (ref 0.82–1.77)
TSH: 4.99 u[IU]/mL — ABNORMAL HIGH (ref 0.450–4.500)

## 2024-09-14 ENCOUNTER — Ambulatory Visit: Payer: Managed Care, Other (non HMO)

## 2024-09-14 DIAGNOSIS — I5022 Chronic systolic (congestive) heart failure: Secondary | ICD-10-CM

## 2024-09-15 LAB — CUP PACEART REMOTE DEVICE CHECK
Battery Remaining Longevity: 59 mo
Battery Remaining Percentage: 74 %
Battery Voltage: 2.99 V
Brady Statistic AP VP Percent: 3 %
Brady Statistic AP VS Percent: 1 %
Brady Statistic AS VP Percent: 95 %
Brady Statistic AS VS Percent: 1.3 %
Brady Statistic RA Percent Paced: 2.3 %
Date Time Interrogation Session: 20251222130035
HighPow Impedance: 81 Ohm
HighPow Impedance: 81 Ohm
Implantable Lead Connection Status: 753985
Implantable Lead Connection Status: 753985
Implantable Lead Connection Status: 753985
Implantable Lead Implant Date: 20160407
Implantable Lead Implant Date: 20160407
Implantable Lead Implant Date: 20160407
Implantable Lead Location: 753858
Implantable Lead Location: 753859
Implantable Lead Location: 753860
Implantable Lead Model: 7122
Implantable Pulse Generator Implant Date: 20240325
Lead Channel Impedance Value: 450 Ohm
Lead Channel Impedance Value: 450 Ohm
Lead Channel Impedance Value: 680 Ohm
Lead Channel Pacing Threshold Amplitude: 0.5 V
Lead Channel Pacing Threshold Amplitude: 0.75 V
Lead Channel Pacing Threshold Amplitude: 1.5 V
Lead Channel Pacing Threshold Pulse Width: 0.5 ms
Lead Channel Pacing Threshold Pulse Width: 0.5 ms
Lead Channel Pacing Threshold Pulse Width: 0.8 ms
Lead Channel Sensing Intrinsic Amplitude: 12 mV
Lead Channel Sensing Intrinsic Amplitude: 3.1 mV
Lead Channel Setting Pacing Amplitude: 2 V
Lead Channel Setting Pacing Amplitude: 2 V
Lead Channel Setting Pacing Amplitude: 2.5 V
Lead Channel Setting Pacing Pulse Width: 0.5 ms
Lead Channel Setting Pacing Pulse Width: 0.8 ms
Lead Channel Setting Sensing Sensitivity: 0.5 mV
Pulse Gen Serial Number: 5566447
Zone Setting Status: 755011

## 2024-09-16 NOTE — Progress Notes (Signed)
 Remote ICD Transmission

## 2024-09-20 ENCOUNTER — Ambulatory Visit: Payer: Self-pay | Admitting: Internal Medicine

## 2025-03-15 ENCOUNTER — Encounter

## 2025-06-14 ENCOUNTER — Encounter

## 2025-09-13 ENCOUNTER — Encounter

## 2025-12-13 ENCOUNTER — Encounter
